# Patient Record
Sex: Male | Born: 1970
Health system: Southern US, Community
[De-identification: ages and names within clinical notes are randomized; demographics above are authoritative.]

## PROBLEM LIST (undated history)

## (undated) DIAGNOSIS — T753XXA Motion sickness, initial encounter: Secondary | ICD-10-CM

## (undated) DIAGNOSIS — N200 Calculus of kidney: Secondary | ICD-10-CM

## (undated) DIAGNOSIS — Z87442 Personal history of urinary calculi: Secondary | ICD-10-CM

## (undated) DIAGNOSIS — K429 Umbilical hernia without obstruction or gangrene: Principal | ICD-10-CM

## (undated) DIAGNOSIS — E119 Type 2 diabetes mellitus without complications: Secondary | ICD-10-CM

## (undated) DIAGNOSIS — Z973 Presence of spectacles and contact lenses: Secondary | ICD-10-CM

## (undated) DIAGNOSIS — B019 Varicella without complication: Secondary | ICD-10-CM

## (undated) HISTORY — PX: ANKLE SURGERY: SHX546

## (undated) HISTORY — PX: OTHER SURGICAL HISTORY: SHX169

## (undated) HISTORY — PX: WISDOM TOOTH EXTRACTION: SHX21

## (undated) HISTORY — DX: Umbilical hernia without obstruction or gangrene: K42.9

## (undated) HISTORY — PX: SHOULDER SURGERY: SHX246

## (undated) HISTORY — DX: Varicella without complication: B01.9

## (undated) HISTORY — DX: Calculus of kidney: N20.0

---

## 1998-10-26 ENCOUNTER — Emergency Department (HOSPITAL_COMMUNITY): Admission: EM | Admit: 1998-10-26 | Discharge: 1998-10-26 | Payer: Self-pay | Admitting: Emergency Medicine

## 1998-10-26 ENCOUNTER — Encounter: Payer: Self-pay | Admitting: Emergency Medicine

## 2002-03-19 ENCOUNTER — Encounter: Payer: Self-pay | Admitting: Occupational Medicine

## 2002-03-19 ENCOUNTER — Encounter: Admission: RE | Admit: 2002-03-19 | Discharge: 2002-03-19 | Payer: Self-pay | Admitting: Occupational Medicine

## 2005-12-21 ENCOUNTER — Emergency Department (HOSPITAL_COMMUNITY): Admission: EM | Admit: 2005-12-21 | Discharge: 2005-12-21 | Payer: Self-pay | Admitting: Family Medicine

## 2010-06-26 ENCOUNTER — Emergency Department: Payer: Self-pay | Admitting: Unknown Physician Specialty

## 2011-09-27 ENCOUNTER — Ambulatory Visit
Admission: RE | Admit: 2011-09-27 | Discharge: 2011-09-27 | Disposition: A | Discharge status: Home / Self Care | Payer: BC Managed Care – HMO | Source: Ambulatory Visit | Attending: Family Medicine | Admitting: Family Medicine

## 2011-09-27 ENCOUNTER — Other Ambulatory Visit: Payer: Self-pay | Admitting: Family Medicine

## 2011-09-27 DIAGNOSIS — R109 Unspecified abdominal pain: Secondary | ICD-10-CM

## 2011-09-27 MED ORDER — IOHEXOL 300 MG/ML  SOLN
125.0000 mL | Freq: Once | INTRAMUSCULAR | Status: AC | PRN
  Administered 2011-09-27: 125 mL via INTRAVENOUS

## 2011-10-12 ENCOUNTER — Encounter (INDEPENDENT_AMBULATORY_CARE_PROVIDER_SITE_OTHER): Payer: Self-pay | Admitting: General Surgery

## 2011-10-17 ENCOUNTER — Ambulatory Visit (INDEPENDENT_AMBULATORY_CARE_PROVIDER_SITE_OTHER): Payer: BC Managed Care – HMO | Admitting: Surgery

## 2011-10-17 ENCOUNTER — Encounter (INDEPENDENT_AMBULATORY_CARE_PROVIDER_SITE_OTHER): Payer: Self-pay | Admitting: Surgery

## 2011-10-17 VITALS — BP 137/94 | HR 91 | Temp 97.8°F | Ht 68.0 in | Wt 214.8 lb

## 2011-10-17 DIAGNOSIS — K429 Umbilical hernia without obstruction or gangrene: Secondary | ICD-10-CM | POA: Insufficient documentation

## 2011-10-17 HISTORY — DX: Umbilical hernia without obstruction or gangrene: K42.9

## 2011-10-17 NOTE — Patient Instructions (Signed)
If the small hernia in your belly-button starts causing pain or if it seems to be getting bigger, come back to see [place] and we will discuss surgery to repair it.

## 2011-10-17 NOTE — Progress Notes (Signed)
NAME: Andrew ZOLL JR. DOB: 1971/02/19 MRN: [id number]                                                                                      DATE: 10/17/2011  PCP: Provider Not In System Referring Provider: Brain Hilts A. Elmahdy  IMPRESSION:  Small umbilical hernia, asymptomatic  PLAN:   Does not need repair at this time. Discussed with him that if it enlarges or becomes symptomatic we can change plans and repair it                 CC:  Chief Complaint  Patient presents with  . Pre-op Exam    eval umb hernia    HPI:  Andrew Holloway. is a 41 y.o.  male who presents for evaluation of Umbilical hernia. It has been present since the 90's and never changed nor caused any problems. He was seen in an urgent care for some RLQ pain, and a CT confirmed a small UH containing fat. Surgical opinion was requested. There are no current symptoms and when he was having the RLQ pain the UH did not hurt  PMH:  has a past medical history of Umbilical hernia (10/17/2011).  PSH:   has past surgical history that includes Shoulder surgery and Ankle surgery.  ALLERGIES:  No Known Allergies  MEDICATIONS: No current outpatient prescriptions on file.  ROS: Negative  EXAM:   GENERAL:  The patient is alert, oriented, and generally healthy-appearing, NAD. Mood and affect are normal.  HEENT:  The head is normocephalic, the eyes nonicteric, the pupils were round regular and equal. EOMs are normal. Pharynx normal. Dentition good.  NECK:  The neck is supple and there are no masses or thyromegaly.  LUNGS:  Normal respirations and clear to auscultation.  HEART:  Regular rhythm, with no murmurs rubs or gallops. Pulses are intact carotid dorsalis pedis and posterior tibial. No significant varicosities are noted.  ABDOMEN:  Soft, flat, and nontender. No masses or organomegaly is noted. Small UH presenting just the  Right side of the Umbilcal area is noted. Bowel sounds are normal.The hernia reduces a bit,  is non tender and feels like pre-peritoneal fat.    EXTREMITIES:  Good range of motion, no edema.   DATA REVIEWED:  Notes from the referring physician    Currie Paris 10/17/2011  CC:Dr [facility]

## 2012-08-02 ENCOUNTER — Telehealth: Payer: Self-pay

## 2012-08-02 NOTE — Telephone Encounter (Signed)
error 

## 2012-08-07 HISTORY — PX: HERNIA REPAIR: SHX51

## 2013-02-12 ENCOUNTER — Ambulatory Visit (INDEPENDENT_AMBULATORY_CARE_PROVIDER_SITE_OTHER): Payer: Self-pay | Admitting: Internal Medicine

## 2013-02-12 ENCOUNTER — Encounter: Payer: Self-pay | Admitting: Internal Medicine

## 2013-02-12 VITALS — BP 118/80 | HR 74 | Temp 98.4°F | Resp 14 | Ht 68.0 in | Wt 214.5 lb

## 2013-02-12 DIAGNOSIS — K429 Umbilical hernia without obstruction or gangrene: Secondary | ICD-10-CM

## 2013-02-12 DIAGNOSIS — Z0000 Encounter for general adult medical examination without abnormal findings: Secondary | ICD-10-CM

## 2013-02-12 DIAGNOSIS — E669 Obesity, unspecified: Secondary | ICD-10-CM

## 2013-02-12 DIAGNOSIS — L989 Disorder of the skin and subcutaneous tissue, unspecified: Secondary | ICD-10-CM

## 2013-02-12 NOTE — Progress Notes (Signed)
Patient ID: Andrew Schwartz., male   DOB: 17-Mar-1971, 42 y.o.   MRN: [id number]  Patient Active Problem List   Diagnosis Date Noted  . Obesity, unspecified 02/13/2013  . Skin lesion of scalp 02/13/2013  . Umbilical hernia 10/17/2011    Subjective:  CC:   Chief Complaint  Patient presents with  . Establish Care    HPI:   Andrew Abdon. is a 42 y.o. male who presents as a new patient to establish primary care with the chief complaint of New patient, referred by wife Andrew Holloway, my patient and son Andrew Holloway  Cc:  unbilical hernia, Now becoming painful . Preset for 15 yrs now more prominent with weight gain of  12 to 20 lbs.    Weighed 11 3 lbs in high school,  119 when married. 24 yrs ago   2) overweight: Has lost 10 lb s following the low carb  Diet I gave his wife.  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  Played sports in high school  Had to give up  competitive softball 10 years ago  When he took current job     Past Medical History  Diagnosis Date  . Umbilical hernia 10/17/2011  . Kidney stones   . Chicken pox     Past Surgical History  Procedure Laterality Date  . Shoulder surgery      rotator cuff  . Ankle surgery      x2    Family History  Problem Relation Age of Onset  . Heart disease Paternal Grandfather   . Heart disease Maternal Grandfather   . Diabetes Maternal Grandmother   . Hypertension Mother   . Hyperlipidemia Father     History   Social History  . Marital Status: Married    Spouse Name: N/A    Number of Children: N/A  . Years of Education: N/A   Occupational History  . Not on file.   Social History Main Topics  . Smoking status: Never Smoker   . Smokeless tobacco: Never Used  . Alcohol Use: Yes     Comment: 2x month  . Drug Use: No  .  Sexually Active: Yes   Other Topics Concern  . Not on file   Social History Narrative  . No narrative on file    No Known Allergies    Review of Systems:   The remainder of the review of systems was negative except those addressed in the HPI.       Objective:  BP 118/80  Pulse 74  Temp(Src) 98.4 F (36.9 C) (Oral)  Resp 14  Ht 5\' 8"  (1.727 m)  Wt 214 lb 8 oz (97.297 kg)  BMI 32.62 kg/m2  SpO2 96%  General appearance: alert, cooperative and appears stated age Ears: normal TM's and external ear canals both ears Throat: lips, mucosa, and tongue normal; teeth and gums normal Neck: no adenopathy, no carotid bruit, supple, symmetrical, trachea midline and thyroid not enlarged, symmetric, no tenderness/mass/nodules Back: symmetric, no curvature. ROM normal. No CVA tenderness. Lungs: clear to auscultation bilaterally Heart: regular rate and rhythm, S1, S2 normal, no murmur, click, rub or gallop Abdomen: soft, non-tender; bowel sounds normal; no masses,  no organomegaly Pulses: 2+ and symmetric Skin: Skin color, texture, turgor normal. Dime  sized red lesion with raised border behind right ear  Lymph nodes: Cervical, supraclavicular, and axillary nodes normal.  Assessment and Plan:  Obesity, unspecified I have addressed  BMI and recommended a low glycemic index diet utilizing smaller more frequent meals to increase metabolism.  I have also recommended that patient start exercising with a goal of 30 minutes of aerobic exercise a minimum of 5 days per week. Screening for lipid disorders, thyroid and diabetes to be done today.    Umbilical hernia Referral to [facility] for management of hernia.   Skin lesion of scalp He has an annular lesion with raised borders behind his right ear. Referral to Dr Adolphus Birchwood. For evaluation    Updated Medication List No outpatient encounter prescriptions on file as of 02/12/2013.   No facility-administered encounter medications on  file as of 02/12/2013.     Orders Placed This Encounter  Procedures  . Ambulatory referral to General Surgery  . Ambulatory referral to Dermatology  . EKG 12-Lead    No Follow-up on file.

## 2013-02-12 NOTE — Patient Instructions (Addendum)
Referral  To Dr Adolphus Birchwood for evaluation of what may be a basal cell CA behind your right ear     Referral to Dr Floyce Stakes Surgica;l  Fasting labs ASAP at wife's office (use letter)

## 2013-02-13 ENCOUNTER — Encounter: Payer: Self-pay | Admitting: Internal Medicine

## 2013-02-13 DIAGNOSIS — L989 Disorder of the skin and subcutaneous tissue, unspecified: Secondary | ICD-10-CM | POA: Insufficient documentation

## 2013-02-13 DIAGNOSIS — E669 Obesity, unspecified: Secondary | ICD-10-CM | POA: Insufficient documentation

## 2013-02-13 NOTE — Assessment & Plan Note (Signed)
He has an annular lesion with raised borders behind his right ear. Referral to Dr Adolphus Birchwood. For evaluation

## 2013-02-13 NOTE — Assessment & Plan Note (Signed)
Referral to [facility] for management of hernia.

## 2013-02-13 NOTE — Assessment & Plan Note (Signed)
I have addressed  BMI and recommended a low glycemic index diet utilizing smaller more frequent meals to increase metabolism.  I have also recommended that patient start exercising with a goal of 30 minutes of aerobic exercise a minimum of 5 days per week. Screening for lipid disorders, thyroid and diabetes to be done today.   

## 2013-02-18 ENCOUNTER — Telehealth: Payer: Self-pay | Admitting: Internal Medicine

## 2013-02-18 NOTE — Telephone Encounter (Signed)
Patient notified and order faxed as requested. 

## 2013-02-18 NOTE — Telephone Encounter (Signed)
Left message to call office

## 2013-02-18 NOTE — Telephone Encounter (Signed)
Outside labs reviewed .  Cholesterol and thyroid were fine. No signs of diabetes.  But your liver enzyme areelevated for unclear reasons.   I recommend that you return  for additional blood tests to rule out various infections and autoimmune disorders that can elevated liver enzymes (hepatitis, hemochromatosis, etc) and have an ultrasound of the liver to examine its size and general appearance. I will send letter to Christy's office [facility]) for additional blood tests.  does not need to fast

## 2013-02-27 ENCOUNTER — Encounter: Payer: Self-pay | Admitting: Internal Medicine

## 2013-03-12 ENCOUNTER — Encounter: Payer: Self-pay | Admitting: General Surgery

## 2013-03-12 ENCOUNTER — Ambulatory Visit: Payer: Self-pay | Admitting: General Surgery

## 2013-03-12 ENCOUNTER — Ambulatory Visit (INDEPENDENT_AMBULATORY_CARE_PROVIDER_SITE_OTHER): Payer: PRIVATE HEALTH INSURANCE | Admitting: General Surgery

## 2013-03-12 VITALS — BP 124/88 | HR 74 | Resp 14 | Ht 68.0 in | Wt 218.0 lb

## 2013-03-12 DIAGNOSIS — K429 Umbilical hernia without obstruction or gangrene: Secondary | ICD-10-CM

## 2013-03-12 NOTE — Progress Notes (Signed)
Patient ID: Andrew Holloway., male   DOB: 06/27/1971, 42 y.o.   MRN: [id number]  Chief Complaint  Patient presents with  . Other    Evaluation of umbilical hernia    HPI [name]. is a 42 y.o. male who presents for an evaluation of an umbilical hernia. The patient states he has had this hernia for many years but has started to bother him within the last year. He states he has occasional abdominal pain. No other problems at this time.    HPI  Past Medical History  Diagnosis Date  . Umbilical hernia 10/17/2011  . Chicken pox   . Kidney stones     Past Surgical History  Procedure Laterality Date  . Shoulder surgery      rotator cuff  . Ankle surgery      x2    Family History  Problem Relation Age of Onset  . Heart disease Paternal Grandfather   . Heart disease Maternal Grandfather   . Diabetes Maternal Grandmother   . Hypertension Mother   . Hyperlipidemia Father     Social History History  Substance Use Topics  . Smoking status: Never Smoker   . Smokeless tobacco: Never Used  . Alcohol Use: Yes     Comment: 2x month    No Known Allergies  No current outpatient prescriptions on file.   No current facility-administered medications for this visit.    Review of Systems Review of Systems  Constitutional: Negative.   Respiratory: Negative.   Cardiovascular: Negative.   Gastrointestinal: Positive for abdominal pain.    Blood pressure 124/88, pulse 74, resp. rate 14, height 5\' 8"  (1.727 m), weight 218 lb (98.884 kg).  Physical Exam Physical Exam  Constitutional: He is oriented to person, place, and time. He appears well-developed and well-nourished.  Neck: No thyromegaly present.  Cardiovascular: Normal rate, regular rhythm and normal heart sounds.   No murmur heard. Pulmonary/Chest: Effort normal and breath sounds normal.  Abdominal: Soft. Normal appearance and bowel sounds are normal. A hernia is present.  Nondeductible umbilical hernia. 2 cm  defect.   Lymphadenopathy:    He has no cervical adenopathy.  Neurological: He is alert and oriented to person, place, and time.    Data Reviewed None   Assessment    Symptomatic umbilical hernia    Plan    Indications for surgical repair were reviewed. The possibility of prosthetic mesh for reinforcement was discussed.    Patient's surgery has been scheduled for 03-28-13 at [facility].    Earline Mayotte 03/14/2013, 6:50 PM

## 2013-03-12 NOTE — Patient Instructions (Addendum)
Patient to be scheduled for hernia repair. Patient advised of lifting, pushing, and pulling restraints after repair.   Hernia, Surgical Repair A hernia occurs when an internal organ pushes out through a weak spot in the belly (abdominal) wall muscles. Hernias commonly occur in the groin and around the navel. Hernias often can be pushed back into place (reduced). Most hernias tend to get worse over time. Problems occur when abdominal contents get stuck in the opening (incarcerated hernia). The blood supply gets cut off (strangulated hernia). This is an emergency and needs surgery. Otherwise, hernia repair can be an elective procedure. This means you can schedule this at your convenience when an emergency is not present. Because complications can occur, if you decide to repair the hernia, it is best to do it soon. When it becomes an emergency procedure, there is increased risk of complications after surgery. CAUSES   Heavy lifting.  Obesity.  Prolonged coughing.  Straining to move your bowels.  Hernias can also occur through a cut (incision) by a surgeonafter an abdominal operation. HOME CARE INSTRUCTIONS Before the repair:  Bed rest is not required. You may continue your normal activities, but avoid heavy lifting (more than 10 pounds) or straining. Cough gently. If you are a smoker, it is best to stop. Even the best hernia repair can break down with the continual strain of coughing.  Do not wear anything tight over your hernia. Do not try to keep it in with an outside bandage or truss. These can damage abdominal contents if they are trapped in the hernia sac.  Eat a normal diet. Avoid constipation. Straining over long periods of time to have a bowel movement will increase hernia size. It also can breakdown repairs. If you cannot do this with diet alone, laxatives or stool softeners may be used. PRIOR TO SURGERY, SEEK IMMEDIATE MEDICAL CARE IF: You have problems (symptoms) of a trapped  (incarcerated) hernia. Symptoms include:  An oral temperature above 102 F (38.9 C) develops, or as your caregiver suggests.  Increasing abdominal pain.  Feeling sick to your stomach(nausea) and vomiting.  You stop passing gas or stool.  The hernia is stuck outside the abdomen, looks discolored, feels hard, or is tender.  You have any changes in your bowel habits or in the hernia that is unusual for you. LET YOUR CAREGIVERS KNOW ABOUT THE FOLLOWING:  Allergies.  Medications taken including herbs, eye drops, over the counter medications, and creams.  Use of steroids (by mouth or creams).  Family or personal history of problems with anesthetics or Novocaine.  Possibility of pregnancy, if this applies.  Personal history of blood clots (thrombophlebitis).  Family or personal history of bleeding or blood problems.  Previous surgery.  Other health problems. BEFORE THE PROCEDURE You should be present 1 hour prior to your procedure, or as directed by your caregiver.  AFTER THE PROCEDURE After surgery, you will be taken to the recovery area. A nurse will watch and check your progress there. Once you are awake, stable, and taking fluids well, you will be allowed to go home as long as there are no problems. Once home, an ice pack (wrapped in a light towel) applied to your operative site may help with discomfort. It may also keep the swelling down. Do not lift anything heavier than 10 pounds (4.55 kilograms). Take showers not baths. Do not drive while taking narcotics. Follow instructions as suggested by your caregiver.  SEEK IMMEDIATE MEDICAL CARE IF: After surgery:  There is  redness, swelling, or increasing pain in the wound.  There is pus coming from the wound.  There is drainage from a wound lasting longer than 1 day.  An unexplained oral temperature above 102 F (38.9 C) develops.  You notice a foul smell coming from the wound or dressing.  There is a breaking open of a  wound (edged not staying together) after the sutures have been removed.  You notice increasing pain in the shoulders (shoulder strap areas).  You develop dizzy episodes or fainting while standing.  You develop persistent nausea or vomiting.  You develop a rash.  You have difficulty breathing.  You develop any reaction or side effects to medications given. MAKE SURE YOU:   Understand these instructions.  Will watch your condition.  Will get help right away if you are not doing well or get worse. Document Released: 01/17/2001 Document Revised: 10/16/2011 Document Reviewed: 12/10/2007 [facility] Patient Information 2014 [place], [place].  Patient's surgery has been scheduled for 03-28-13 at [facility].

## 2013-03-14 ENCOUNTER — Encounter: Payer: Self-pay | Admitting: General Surgery

## 2013-03-14 ENCOUNTER — Other Ambulatory Visit: Payer: Self-pay | Admitting: General Surgery

## 2013-03-14 DIAGNOSIS — K429 Umbilical hernia without obstruction or gangrene: Secondary | ICD-10-CM

## 2013-03-28 ENCOUNTER — Ambulatory Visit: Payer: Self-pay | Admitting: General Surgery

## 2013-03-28 DIAGNOSIS — K429 Umbilical hernia without obstruction or gangrene: Secondary | ICD-10-CM

## 2013-04-02 ENCOUNTER — Telehealth: Payer: Self-pay | Admitting: *Deleted

## 2013-04-02 NOTE — Telephone Encounter (Signed)
Phone call from pt (talked to [place]) states he has a rash/bumps with itching from chest to lower abdomen that started on Saturday. Umbilical hernia surgery was Friday.  He is using oral benadryl.  He will try hydrocortisone cream and benadryl spray and will call tomorrow with progress report.

## 2013-04-03 ENCOUNTER — Telehealth: Payer: Self-pay | Admitting: *Deleted

## 2013-04-03 NOTE — Telephone Encounter (Signed)
Phone call from patient.  States rash remains but he is not taking benadryl tablet.  Advised to continue to take benadryl until rash is gone and informed that they make a benadryl spray topically as well. He feels like it can tolerate it now but if it worsens he will call back tomorrow if worsens.

## 2013-04-04 ENCOUNTER — Encounter: Payer: Self-pay | Admitting: General Surgery

## 2013-04-09 ENCOUNTER — Encounter: Payer: Self-pay | Admitting: General Surgery

## 2013-04-09 ENCOUNTER — Ambulatory Visit (INDEPENDENT_AMBULATORY_CARE_PROVIDER_SITE_OTHER): Payer: PRIVATE HEALTH INSURANCE | Admitting: General Surgery

## 2013-04-09 VITALS — BP 120/80 | HR 80 | Resp 12 | Ht 68.0 in | Wt 219.0 lb

## 2013-04-09 DIAGNOSIS — K429 Umbilical hernia without obstruction or gangrene: Secondary | ICD-10-CM

## 2013-04-09 NOTE — Patient Instructions (Addendum)
Proper lifting techniques reviewed. Patient to return to work on 04/14/13.

## 2013-04-09 NOTE — Progress Notes (Signed)
Patient ID: Andrew Schwartz., male   DOB: 1970/08/21, 42 y.o.   MRN: [id number]  Chief Complaint  Patient presents with  . Routine Post Op    umbilical hernia repair     HPI Andrew Holloway. is a 42 y.o. male here today for his post op umbilical hernia repair done on 03/28/13.Patient states he has had an rash since surgery. He had been required to make use of a chlorhexidine prepped wiped prior to the procedure. He did receive Kefzol intravenously as well. HPI  Past Medical History  Diagnosis Date  . Umbilical hernia 10/17/2011  . Chicken pox   . Kidney stones     Past Surgical History  Procedure Laterality Date  . Shoulder surgery      rotator cuff  . Ankle surgery      x2  . Hernia repair  2014    umbilical hernia    Family History  Problem Relation Age of Onset  . Heart disease Paternal Grandfather   . Heart disease Maternal Grandfather   . Diabetes Maternal Grandmother   . Hypertension Mother   . Hyperlipidemia Father     Social History History  Substance Use Topics  . Smoking status: Never Smoker   . Smokeless tobacco: Never Used  . Alcohol Use: Yes     Comment: 2x month    No Known Allergies  No current outpatient prescriptions on file.   No current facility-administered medications for this visit.    Review of Systems Review of Systems  Constitutional: Negative.   Respiratory: Negative.   Cardiovascular: Negative.     Blood pressure 120/80, pulse 80, resp. rate 12, height 5\' 8"  (1.727 m), weight 219 lb (99.338 kg).  Physical Exam Physical Exam  Constitutional: He is oriented to person, place, and time. He appears well-developed and well-nourished.  Abdominal:  Umbilical hernia repair healing well.  Neurological: He is alert and oriented to person, place, and time.  Skin: Skin is warm and dry.    Data Reviewed None.  Assessment    Doing well status post primary repair of a small umbilical hernia.    Plan    Good judgment with  strenuous activity was encouraged. Proper lifting technique was reviewed. He'll be maintained on a 20 pound weight restriction for the next 2 weeks.       Earline Mayotte 04/11/2013, 5:36 PM

## 2013-04-11 ENCOUNTER — Encounter: Payer: Self-pay | Admitting: General Surgery

## 2013-06-12 ENCOUNTER — Other Ambulatory Visit: Payer: Self-pay

## 2014-03-20 ENCOUNTER — Ambulatory Visit: Payer: Self-pay | Admitting: Unknown Physician Specialty

## 2014-11-04 ENCOUNTER — Emergency Department
Admit: 2014-11-04 | Disposition: A | Discharge status: Home / Self Care | Payer: Self-pay | Admitting: Emergency Medicine

## 2014-11-04 ENCOUNTER — Telehealth: Payer: Self-pay | Admitting: Internal Medicine

## 2014-11-04 LAB — COMPREHENSIVE METABOLIC PANEL
ALBUMIN: 4.5 g/dL
ALT: 35 U/L
Alkaline Phosphatase: 62 U/L
Anion Gap: 8 (ref 7–16)
BUN: 12 mg/dL
Bilirubin,Total: 2 mg/dL — ABNORMAL HIGH
Calcium, Total: 9.5 mg/dL
Chloride: 101 mmol/L
Co2: 30 mmol/L
Creatinine: 1.09 mg/dL
EGFR (African American): >60
EGFR (Non-African Amer.): >60
Glucose: 132 mg/dL — ABNORMAL HIGH
POTASSIUM: 3.9 mmol/L
SGOT(AST): 22 U/L
SODIUM: 139 mmol/L
TOTAL PROTEIN: 8.5 g/dL — AB

## 2014-11-04 LAB — URINALYSIS, COMPLETE
Bacteria: NONE SEEN
Bilirubin,UR: NEGATIVE
GLUCOSE, UR: NEGATIVE mg/dL (ref 0–75)
Ketone: NEGATIVE
LEUKOCYTE ESTERASE: NEGATIVE
NITRITE: NEGATIVE
Ph: 5 (ref 4.5–8.0)
Protein: 30
SPECIFIC GRAVITY: 1.029 (ref 1.003–1.030)
Squamous Epithelial: NONE SEEN

## 2014-11-04 LAB — CBC
HCT: 50.7 % (ref 40.0–52.0)
HGB: 17 g/dL (ref 13.0–18.0)
MCH: 30.5 pg (ref 26.0–34.0)
MCHC: 33.5 g/dL (ref 32.0–36.0)
MCV: 91 fL (ref 80–100)
Platelet: 290 10*3/uL (ref 150–440)
RBC: 5.58 10*6/uL (ref 4.40–5.90)
RDW: 13.5 % (ref 11.5–14.5)
WBC: 14.4 10*3/uL — ABNORMAL HIGH (ref 3.8–10.6)

## 2014-11-04 LAB — LIPASE, BLOOD: LIPASE: 27 U/L

## 2014-11-04 NOTE — Telephone Encounter (Signed)
Patient advised by triage to go to ER patient confirmed going to ER. FYI

## 2014-11-04 NOTE — Telephone Encounter (Signed)
Patient Name: Rosaura CarpenterWILLIAM Scardino DOB: 04/14/1971 Initial Comment Caller states he is having lower abdominal pain on left side. Nurse Assessment Nurse: Charna Elizabethrumbull, RN, Cathy Date/Time (Eastern Time): 11/04/2014 9:57:11 AM Confirm and document reason for call. If symptomatic, describe symptoms. ---Caller states he developed left, lower abdominal pain 2 days ago. No vomiting or diarrhea. He states he had fever last night that has now gone away (100.6 temporal last night). No injury in the past 3 days. Has the patient traveled out of the country within the last 30 days? ---No Does the patient require triage? ---Yes Related visit to physician within the last 2 weeks? ---No Does the PT have any chronic conditions? (i.e. diabetes, asthma, etc.) ---No Guidelines Guideline Title Affirmed Question Affirmed Notes Abdominal Pain - Male [1] MODERATE pain (e.g., interferes with normal activities) AND [2] pain comes and goes (cramps) AND [3] present > 24 hours (Exception: pain with Vomiting or Diarrhea - see that Guideline) Hernia Hernia is painful or tender to touch Final Disposition User Go to ED Now Trumbull, RN, [employer] LaboratoriesCathy Comments Caller shared that he has had an umbilical hernia for the past 1.5 Years. He plans to go [facility].

## 2014-11-13 ENCOUNTER — Ambulatory Visit: Payer: Self-pay | Admitting: Nurse Practitioner

## 2014-11-27 NOTE — Op Note (Signed)
PATIENT NAME:  Andrew Holloway, Andrew Holloway MR#:  [id number] DATE OF BIRTH:  [birth year]  DATE OF PROCEDURE:  03/28/2013  PREOPERATIVE DIAGNOSIS: Symptomatic umbilical hernia with incarcerated preperitoneal fat.   POSTOPERATIVE DIAGNOSIS: Symptomatic umbilical hernia with incarcerated preperitoneal fat.     OPERATIVE PROCEDURE: Repair of umbilical hernia.   SURGEON:  Earline MayotteJeffrey W. Haru Shaff, MD   ANESTHESIA: General endotracheal, Marcaine 0.5% plain, 30 mL local infiltration.   ESTIMATED BLOOD LOSS:  Minimal.   CLINICAL NOTE: This 44 year old male has developed an enlarging umbilical hernia that has become increasingly symptomatic, with local discomfort. The preperitoneal fat was nonreducible. He was felt to be a candidate for excision.   OPERATIVE NOTE: With the patient under adequate general endotracheal anesthesia, the abdomen was prepped with ChloraPrep and draped. Marcaine was infiltrated for postoperative analgesia. An infraumbilical incision was made and carried down through the skin and subcutaneous tissue, with hemostasis achieved by electrocautery. The umbilical skin was elevated from the underlying hernia sac. The sac was excised and discarded. The adipose tissue was returned to the abdominal cavity. The defect was only about 1.3 cm in diameter. Primary repair was undertaken. Interrupted 0 Surgilon sutures were utilized. The umbilical skin was tacked to the fascia with 3-0 Vicryl. Then 30 mg of Toradol was infiltrated in the adjacent tissues for postoperative analgesia. The adipose layer was approximated with a running 3-0 Vicryl. The skin was closed with a running 4-0 Vicryl subcuticular suture. Benzoin, Steri-Strips, Telfa and Tegaderm dressing were then applied.   The patient tolerated the procedure well, and was taken to the recovery room in stable condition.      ____________________________ Earline MayotteJeffrey W. Lennin Osmond, MD jwb:mr D: 03/28/2013 19:37:20 ET T: 03/28/2013 21:28:38  ET JOB#: [id number]  cc: Earline MayotteJeffrey W. Zakery Normington, MD, <Dictator> Tomi BambergerSusan Fuller, DNP, NP at [facility]   Lille Karim Brion AlimentW Malayjah Otoole MD ELECTRONICALLY SIGNED 03/31/2013 13:11

## 2015-03-09 ENCOUNTER — Ambulatory Visit (INDEPENDENT_AMBULATORY_CARE_PROVIDER_SITE_OTHER): Payer: Worker's Compensation | Admitting: Family Medicine

## 2015-03-09 VITALS — BP 122/86 | HR 90 | Temp 98.3°F | Resp 16 | Ht 67.5 in | Wt 231.4 lb

## 2015-03-09 DIAGNOSIS — K439 Ventral hernia without obstruction or gangrene: Secondary | ICD-10-CM | POA: Diagnosis not present

## 2015-03-09 DIAGNOSIS — S39011A Strain of muscle, fascia and tendon of abdomen, initial encounter: Secondary | ICD-10-CM | POA: Diagnosis not present

## 2015-03-09 NOTE — Progress Notes (Signed)
Subjective:  This chart was scribed for Trinna Post, MD by Broadus John, Medical Scribe. This patient was seen in Room 13 and the patient's care was started at 6:50 PM.   Patient ID: Andrew Schwartz., male    DOB: 1971/01/29, 44 y.o.   MRN: [id number]  HPI HPI Comments: Andrew Shearn. is a 44 y.o. male who presents to [facility] complaining of abdominal pain, injury at work, onset yesterday at 03/08/15.  Pt notes that he was taking some cylinders out and it swung out while he was holding in a rack with his left hand; therefore instead of going up, it went down and his upper abdomen muscles tensed up. Pt has a history of an umbilical hernia two years ago repaired, He notes that he has not experienced any difficulties with it. He indicates that the pain is more severe when in supine position and sitting up. He denies groin pain, nausea, vomiting, trouble in urination, or bowel changes.  Pt notes that he has a CT scan in April 2016 for diverticulitis, where they found a hernia on the left side,  Been not been experiencing pain in that area prior to his injury at work.  Pt denies history of stomach ulcers.   Pt works Engineer, production for [employer].   Patient Active Problem List   Diagnosis Date Noted  . Obesity, unspecified 02/13/2013  . Skin lesion of scalp 02/13/2013  . Umbilical hernia 10/17/2011   Past Medical History  Diagnosis Date  . Umbilical hernia 10/17/2011  . Chicken pox   . Kidney stones    Past Surgical History  Procedure Laterality Date  . Shoulder surgery      rotator cuff  . Ankle surgery      x2  . Hernia repair  2014    umbilical hernia  . Nasal spectrum    . Rotator cuff right    . Left and right ankle repair ligament     No Known Allergies Prior to Admission medications   Not on File   History   Social History  . Marital Status: Married    Spouse Name: N/A  . Number of Children: N/A  . Years of Education: N/A    Occupational History  . Not on file.   Social History Main Topics  . Smoking status: Never Smoker   . Smokeless tobacco: Never Used  . Alcohol Use: Yes     Comment: 2x month  . Drug Use: No  . Sexual Activity: Yes   Other Topics Concern  . Not on file   Social History Narrative     Review of Systems  Gastrointestinal: Positive for abdominal pain. Negative for nausea, vomiting, diarrhea and constipation.  Genitourinary: Negative for difficulty urinating and genital sores.       Objective:   Physical Exam  Constitutional: He is oriented to person, place, and time. He appears well-developed and well-nourished. No distress.  HENT:  Head: Normocephalic and atraumatic.  Eyes: EOM are normal. Pupils are equal, round, and reactive to light.  Neck: Neck supple.  Cardiovascular: Normal rate.   Pulmonary/Chest: Effort normal.  Abdominal: Bowel sounds are normal.  Tenderness along mid abdomen just above umbilicus just to the right, no palpable defects.   No palpable tenderness or hernia in the inguinal canal.     Musculoskeletal:  Pain with Valsalva as well as sitting up.   Neurological: He is alert and oriented to person,  place, and time. No cranial nerve deficit.  Skin: Skin is warm and dry.  Well healed scar below the umbilicus superior and inferior  Psychiatric: He has a normal mood and affect. His behavior is normal.  Nursing note and vitals reviewed.   Filed Vitals:   03/09/15 1821  BP: 122/86  Pulse: 90  Temp: 98.3 F (36.8 C)  TempSrc: Oral  Resp: 16  Height: 5' 7.5" (1.715 m)  Weight: 231 lb 6 oz (104.951 kg)  SpO2: 98%      Assessment & Plan:  Andrew Derasmo. is a 43 y.o. male Abdominal wall strain, initial encounter - Plan: Ambulatory referral to General Surgery  Hernia of abdominal wall - Plan: Ambulatory referral to General Surgery  No apparent defect or bulge of abd wall on exam, but palpable tenderness over midline abdomen near the prior  hernia repair site,  and reproduction of pain with sitting up or Valsalva.. Suspicious for recurrence of hernia, so will refer to surgeon for eval.   Lifting and straining precautions discussed, and restrictions provided until evaluated by surgeon.    No orders of the defined types were placed in this encounter.   Patient Instructions  I will refer you to the general surgeon, and requested your previous doctor. For now avoid any heavy lifting or straining. See precautions below, but if any increased swelling or acutely increased pain in this area, return to clinic or emergency room for evaluation.  Advil or Aleve over-the-counter if needed for discomfort.  Hernia A hernia occurs when an internal organ pushes out through a weak spot in the abdominal wall. Hernias most commonly occur in the groin and around the navel. Hernias often can be pushed back into place (reduced). Most hernias tend to get worse over time. Some abdominal hernias can get stuck in the opening (irreducible or incarcerated hernia) and cannot be reduced. An irreducible abdominal hernia which is tightly squeezed into the opening is at risk for impaired blood supply (strangulated hernia). A strangulated hernia is a medical emergency. Because of the risk for an irreducible or strangulated hernia, surgery may be recommended to repair a hernia. CAUSES   Heavy lifting.  Prolonged coughing.  Straining to have a bowel movement.  A cut (incision) made during an abdominal surgery. HOME CARE INSTRUCTIONS   Bed rest is not required. You may continue your normal activities.  Avoid lifting more than 10 pounds (4.5 kg) or straining.  Cough gently. If you are a smoker it is best to stop. Even the best hernia repair can break down with the continual strain of coughing. Even if you do not have your hernia repaired, a cough will continue to aggravate the problem.  Do not wear anything tight over your hernia. Do not try to keep it in with an  outside bandage or truss. These can damage abdominal contents if they are trapped within the hernia sac.  Eat a normal diet.  Avoid constipation. Straining over long periods of time will increase hernia size and encourage breakdown of repairs. If you cannot do this with diet alone, stool softeners may be used. SEEK IMMEDIATE MEDICAL CARE IF:   You have a fever.  You develop increasing abdominal pain.  You feel nauseous or vomit.  Your hernia is stuck outside the abdomen, looks discolored, feels hard, or is tender.  You have any changes in your bowel habits or in the hernia that are unusual for you.  You have increased pain or swelling around  the hernia.  You cannot push the hernia back in place by applying gentle pressure while lying down. MAKE SURE YOU:   Understand these instructions.  Will watch your condition.  Will get help right away if you are not doing well or get worse. Document Released: 07/24/2005 Document Revised: 10/16/2011 Document Reviewed: 03/12/2008 [facility] Patient Information 2015 [place], [place]. This information is not intended to replace advice given to you by your health care provider. Make sure you discuss any questions you have with your health care provider.     I personally performed the services described in this documentation, which was scribed in my presence. The recorded information has been reviewed and considered, and addended by me as needed.

## 2015-03-09 NOTE — Patient Instructions (Signed)
I will refer you to the general surgeon, and requested your previous doctor. For now avoid any heavy lifting or straining. See precautions below, but if any increased swelling or acutely increased pain in this area, return to clinic or emergency room for evaluation.  Advil or Aleve over-the-counter if needed for discomfort.  Hernia A hernia occurs when an internal organ pushes out through a weak spot in the abdominal wall. Hernias most commonly occur in the groin and around the navel. Hernias often can be pushed back into place (reduced). Most hernias tend to get worse over time. Some abdominal hernias can get stuck in the opening (irreducible or incarcerated hernia) and cannot be reduced. An irreducible abdominal hernia which is tightly squeezed into the opening is at risk for impaired blood supply (strangulated hernia). A strangulated hernia is a medical emergency. Because of the risk for an irreducible or strangulated hernia, surgery may be recommended to repair a hernia. CAUSES   Heavy lifting.  Prolonged coughing.  Straining to have a bowel movement.  A cut (incision) made during an abdominal surgery. HOME CARE INSTRUCTIONS   Bed rest is not required. You may continue your normal activities.  Avoid lifting more than 10 pounds (4.5 kg) or straining.  Cough gently. If you are a smoker it is best to stop. Even the best hernia repair can break down with the continual strain of coughing. Even if you do not have your hernia repaired, a cough will continue to aggravate the problem.  Do not wear anything tight over your hernia. Do not try to keep it in with an outside bandage or truss. These can damage abdominal contents if they are trapped within the hernia sac.  Eat a normal diet.  Avoid constipation. Straining over long periods of time will increase hernia size and encourage breakdown of repairs. If you cannot do this with diet alone, stool softeners may be used. SEEK IMMEDIATE MEDICAL  CARE IF:   You have a fever.  You develop increasing abdominal pain.  You feel nauseous or vomit.  Your hernia is stuck outside the abdomen, looks discolored, feels hard, or is tender.  You have any changes in your bowel habits or in the hernia that are unusual for you.  You have increased pain or swelling around the hernia.  You cannot push the hernia back in place by applying gentle pressure while lying down. MAKE SURE YOU:   Understand these instructions.  Will watch your condition.  Will get help right away if you are not doing well or get worse. Document Released: 07/24/2005 Document Revised: 10/16/2011 Document Reviewed: 03/12/2008 [facility] Patient Information 2015 [place], [place]. This information is not intended to replace advice given to you by your health care provider. Make sure you discuss any questions you have with your health care provider.

## 2015-03-10 ENCOUNTER — Ambulatory Visit: Payer: Self-pay | Admitting: Family Medicine

## 2015-03-16 ENCOUNTER — Ambulatory Visit (INDEPENDENT_AMBULATORY_CARE_PROVIDER_SITE_OTHER): Payer: Worker's Compensation | Admitting: General Surgery

## 2015-03-16 ENCOUNTER — Encounter: Payer: Self-pay | Admitting: General Surgery

## 2015-03-16 VITALS — BP 130/70 | HR 76 | Resp 12 | Ht 67.5 in | Wt 227.0 lb

## 2015-03-16 DIAGNOSIS — K439 Ventral hernia without obstruction or gangrene: Secondary | ICD-10-CM

## 2015-03-16 NOTE — Progress Notes (Signed)
Patient ID: Andrew Holloway., male   DOB: [birth year], 44 y.o.   MRN: [id number]  Chief Complaint  Patient presents with  . Hernia    HPI [name]. is a 44 y.o. male here today for a evaluation of a possible umbilical hernia. Patient was seen in the urgent care on 03/09/15. 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 He has noticed a bulge when standing, and appreciate some discomfort when bending.  States his bowels are regular and daily.  HPI  Past Medical History  Diagnosis Date  . Umbilical hernia 10/17/2011  . Chicken pox   . Kidney stones     Past Surgical History  Procedure Laterality Date  . Shoulder surgery      rotator cuff  . Ankle surgery      x2  . Hernia repair  2014    umbilical hernia  . Nasal spectrum    . Rotator cuff right    . Left and right ankle repair ligament      Family History  Problem Relation Age of Onset  . Heart disease Paternal Grandfather   . Heart disease Maternal Grandfather   . Diabetes Maternal Grandmother   . Hypertension Mother   . Hyperlipidemia Father     Social History Social History  Substance Use Topics  . Smoking status: Never Smoker   . Smokeless tobacco: Never Used  . Alcohol Use: Yes     Comment: 2x month    No Known Allergies  No current outpatient prescriptions on file.   No current facility-administered medications for this visit.    Review of Systems Review of Systems  Constitutional: Negative.   Respiratory: Negative.   Cardiovascular: Negative.   Gastrointestinal: Negative for diarrhea and constipation.    Blood pressure 130/70, pulse 76, resp. rate 12, height 5' 7.5" (1.715 m), weight 227 lb (102.967 kg).  Physical Exam Physical Exam  Constitutional: He is oriented to person, place, and time. He appears well-developed and well-nourished.  HENT:   Mouth/Throat: Oropharynx is clear and moist.  Eyes: Conjunctivae are normal. No scleral icterus.  Neck: Neck supple.  Cardiovascular: Normal rate, regular rhythm and normal heart sounds.   Pulmonary/Chest: Effort normal and breath sounds normal.  Abdominal: Soft. Normal appearance. There is tenderness. A hernia is present.    Midline defect 3 cm above umbilicus   Lymphadenopathy:    He has no cervical adenopathy.  Neurological: He is alert and oriented to person, place, and time.  Skin: Skin is warm and dry.  Psychiatric: His behavior is normal.    Data Reviewed 2014 operative note from primary repair of a less than 2 cm umbilical fascial defect.  Assessment    Epigastric hernia, symptomatic.    Plan    Indications for elective repair review. I do not think that mesh will be required.     Hernia precautions and incarceration were discussed with the patient. If they develop symptoms of an incarcerated hernia, they were encouraged to seek prompt medical attention.  I have recommended repair of the hernia using mesh on an outpatient basis in the near future. The risk of infection was reviewed.   PCP:  Macky Lower 03/17/2015, 11:43 AM

## 2015-03-16 NOTE — Patient Instructions (Signed)

## 2015-03-17 ENCOUNTER — Telehealth: Payer: Self-pay | Admitting: *Deleted

## 2015-03-17 DIAGNOSIS — K439 Ventral hernia without obstruction or gangrene: Secondary | ICD-10-CM | POA: Insufficient documentation

## 2015-03-17 NOTE — Telephone Encounter (Signed)
Patient is wanting a back to work note with the limitations that he needs. He saw Dr.Byrnett yesterday 03/16/15

## 2015-03-17 NOTE — H&P (Signed)
Patient ID: Andrew S Doering Jr., male   DOB: 06/05/1971, 44 y.o.   MRN: [id number]  Chief Complaint  Patient presents with  . Hernia    HPI Andrew S Luty Jr. is a 44 y.o. male here today for a evaluation of a possible umbilical hernia. Patient was seen in the urgent care on 03/09/15. 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 He has noticed a bulge when standing, and appreciate some discomfort when bending.  States his bowels are regular and daily.  HPI  Past Medical History  Diagnosis Date  . Umbilical hernia 10/17/2011  . Chicken pox   . Kidney stones     Past Surgical History  Procedure Laterality Date  . Shoulder surgery      rotator cuff  . Ankle surgery      x2  . Hernia repair  2014    umbilical hernia  . Nasal spectrum    . Rotator cuff right    . Left and right ankle repair ligament      Family History  Problem Relation Age of Onset  . Heart disease Paternal Grandfather   . Heart disease Maternal Grandfather   . Diabetes Maternal Grandmother   . Hypertension Mother   . Hyperlipidemia Father     Social History Social History  Substance Use Topics  . Smoking status: Never Smoker   . Smokeless tobacco: Never Used  . Alcohol Use: Yes     Comment: 2x month    No Known Allergies  No current outpatient prescriptions on file.   No current facility-administered medications for this visit.    Review of Systems Review of Systems  Constitutional: Negative.   Respiratory: Negative.   Cardiovascular: Negative.   Gastrointestinal: Negative for diarrhea and constipation.    Blood pressure 130/70, pulse 76, resp. rate 12, height 5' 7.5" (1.715 m), weight 227 lb (102.967 kg).  Physical Exam Physical Exam  Constitutional: He is oriented to person, place, and time. He appears well-developed and well-nourished.  HENT:   Mouth/Throat: Oropharynx is clear and moist.  Eyes: Conjunctivae are normal. No scleral icterus.  Neck: Neck supple.  Cardiovascular: Normal rate, regular rhythm and normal heart sounds.   Pulmonary/Chest: Effort normal and breath sounds normal.  Abdominal: Soft. Normal appearance. There is tenderness. A hernia is present.    Midline defect 3 cm above umbilicus   Lymphadenopathy:    He has no cervical adenopathy.  Neurological: He is alert and oriented to person, place, and time.  Skin: Skin is warm and dry.  Psychiatric: His behavior is normal.    Data Reviewed 2014 operative note from primary repair of a less than 2 cm umbilical fascial defect.  Assessment    Epigastric hernia, symptomatic.    Plan    Indications for elective repair review. I do not think that mesh will be required.     Hernia precautions and incarceration were discussed with the patient. If they develop symptoms of an incarcerated hernia, they were encouraged to seek prompt medical attention.  I have recommended repair of the hernia using mesh on an outpatient basis in the near future. The risk of infection was reviewed.   PCP:  Tullo, Teresa L  Alexica Schlossberg W 03/17/2015, 11:43 AM      medical attention.  I have recommended repair of the hernia using mesh on an outpatient basis in the near future. The risk of infection was reviewed.  PCP: Macky Lower  03/17/2015, 11:43 AM

## 2015-03-17 NOTE — Telephone Encounter (Signed)
Note done, pt aware faxed to [facility] [phone]

## 2015-03-23 ENCOUNTER — Encounter: Payer: Self-pay | Admitting: *Deleted

## 2015-03-23 ENCOUNTER — Inpatient Hospital Stay: Admission: RE | Admit: 2015-03-23 | Payer: Self-pay | Source: Ambulatory Visit

## 2015-03-23 DIAGNOSIS — Z833 Family history of diabetes mellitus: Secondary | ICD-10-CM | POA: Diagnosis not present

## 2015-03-23 DIAGNOSIS — Z9889 Other specified postprocedural states: Secondary | ICD-10-CM | POA: Diagnosis not present

## 2015-03-23 DIAGNOSIS — Z8489 Family history of other specified conditions: Secondary | ICD-10-CM | POA: Diagnosis not present

## 2015-03-23 DIAGNOSIS — K429 Umbilical hernia without obstruction or gangrene: Secondary | ICD-10-CM | POA: Diagnosis present

## 2015-03-23 DIAGNOSIS — K439 Ventral hernia without obstruction or gangrene: Secondary | ICD-10-CM | POA: Diagnosis present

## 2015-03-23 DIAGNOSIS — Z8249 Family history of ischemic heart disease and other diseases of the circulatory system: Secondary | ICD-10-CM | POA: Diagnosis not present

## 2015-03-23 NOTE — Patient Instructions (Signed)
  Your procedure is scheduled on: 03/30/15 Report to Day Surgery. [facility] To find out your arrival time please call [phone] between 1PM - 3PM on8/22/16  Remember: Instructions that are not followed completely may result in serious medical risk, up to and including death, or upon the discretion of your surgeon and anesthesiologist your surgery may need to be rescheduled.    X____ 1. Do not eat food or drink liquids after midnight. No gum chewing or hard candies.     __X__ 2. No Alcohol for 24 hours before or after surgery.   ____ 3. Bring all medications with you on the day of surgery if instructed.    __X__ 4. Notify your doctor if there is any change in your medical condition     (cold, fever, infections).     Do not wear jewelry, make-up, hairpins, clips or nail polish.  Do not wear lotions, powders, or perfumes. You may wear deodorant.  Do not shave 48 hours prior to surgery. Men may shave face and neck.  Do not bring valuables to the hospital.    [facility] is not responsible for any belongings or valuables.               Contacts, dentures or bridgework may not be worn into surgery.  Leave your suitcase in the car. After surgery it may be brought to your room.  For patients admitted to the hospital, discharge time is determined by your                treatment team.   Patients discharged the day of surgery will not be allowed to drive home.   Please read over the following fact sheets that you were given:   Surgical Site Infection Prevention   ____ Take these medicines the morning of surgery with A SIP OF WATER:    1.   2.   3.   4.  5.  6.  ____ Fleet Enema (as directed)   ___X_ Use CHG Soap as directed  ____ Use inhalers on the day of surgery  ____ Stop metformin 2 days prior to surgery    ____ Take 1/2 of usual insulin dose the night before surgery and none on the morning of surgery.   ____ Stop Coumadin/Plavix/aspirin on   ____ Stop  Anti-inflammatories on    ____ Stop supplements until after surgery.    ____ Bring C-Pap to the hospital.

## 2015-03-30 ENCOUNTER — Ambulatory Visit: Payer: Worker's Compensation | Admitting: Registered Nurse

## 2015-03-30 ENCOUNTER — Ambulatory Visit
Admission: RE | Admit: 2015-03-30 | Discharge: 2015-03-30 | Disposition: A | Discharge status: Home / Self Care | Payer: Worker's Compensation | Source: Ambulatory Visit | Attending: General Surgery | Admitting: General Surgery

## 2015-03-30 ENCOUNTER — Encounter
Admission: RE | Disposition: A | Discharge status: Home / Self Care | Payer: Self-pay | Source: Ambulatory Visit | Attending: General Surgery

## 2015-03-30 ENCOUNTER — Encounter: Payer: Self-pay | Admitting: *Deleted

## 2015-03-30 DIAGNOSIS — Z9889 Other specified postprocedural states: Secondary | ICD-10-CM | POA: Insufficient documentation

## 2015-03-30 DIAGNOSIS — K439 Ventral hernia without obstruction or gangrene: Secondary | ICD-10-CM

## 2015-03-30 DIAGNOSIS — K429 Umbilical hernia without obstruction or gangrene: Secondary | ICD-10-CM | POA: Insufficient documentation

## 2015-03-30 DIAGNOSIS — Z833 Family history of diabetes mellitus: Secondary | ICD-10-CM | POA: Insufficient documentation

## 2015-03-30 DIAGNOSIS — Z8249 Family history of ischemic heart disease and other diseases of the circulatory system: Secondary | ICD-10-CM | POA: Insufficient documentation

## 2015-03-30 DIAGNOSIS — Z8489 Family history of other specified conditions: Secondary | ICD-10-CM | POA: Insufficient documentation

## 2015-03-30 HISTORY — PX: EPIGASTRIC HERNIA REPAIR: SHX404

## 2015-03-30 HISTORY — PX: HERNIA REPAIR: SHX51

## 2015-03-30 SURGERY — REPAIR, HERNIA, EPIGASTRIC, ADULT
Anesthesia: General

## 2015-03-30 MED ORDER — ACETAMINOPHEN 10 MG/ML IV SOLN
INTRAVENOUS | Status: DC | PRN
  Administered 2015-03-30: 1000 mg via INTRAVENOUS

## 2015-03-30 MED ORDER — LACTATED RINGERS IV SOLN
INTRAVENOUS | Status: DC
  Administered 2015-03-30: 1000 mL via INTRAVENOUS
  Administered 2015-03-30: 07:00:00 via INTRAVENOUS

## 2015-03-30 MED ORDER — CEFAZOLIN SODIUM-DEXTROSE 2-3 GM-% IV SOLR
INTRAVENOUS | Status: AC
  Filled 2015-03-30: qty 50

## 2015-03-30 MED ORDER — FENTANYL CITRATE (PF) 100 MCG/2ML IJ SOLN
INTRAMUSCULAR | Status: DC | PRN
  Administered 2015-03-30: 100 ug via INTRAVENOUS

## 2015-03-30 MED ORDER — SUCCINYLCHOLINE CHLORIDE 20 MG/ML IJ SOLN
INTRAMUSCULAR | Status: DC | PRN
  Administered 2015-03-30: 100 mg via INTRAVENOUS

## 2015-03-30 MED ORDER — LIDOCAINE HCL (PF) 1 % IJ SOLN
INTRAMUSCULAR | Status: AC
  Filled 2015-03-30: qty 30

## 2015-03-30 MED ORDER — HYDROCODONE-ACETAMINOPHEN 5-325 MG PO TABS
1.0000 | ORAL_TABLET | Freq: Four times a day (QID) | ORAL | Status: DC | PRN
  Administered 2015-03-30: 1 via ORAL

## 2015-03-30 MED ORDER — ROCURONIUM BROMIDE 100 MG/10ML IV SOLN
INTRAVENOUS | Status: DC | PRN
  Administered 2015-03-30: 30 mg via INTRAVENOUS

## 2015-03-30 MED ORDER — KETOROLAC TROMETHAMINE 30 MG/ML IJ SOLN
INTRAMUSCULAR | Status: DC | PRN
  Administered 2015-03-30: 30 mg via INTRAVENOUS

## 2015-03-30 MED ORDER — SODIUM BICARBONATE 4 % IV SOLN
INTRAVENOUS | Status: AC
  Filled 2015-03-30: qty 5

## 2015-03-30 MED ORDER — GLYCOPYRROLATE 0.2 MG/ML IJ SOLN
INTRAMUSCULAR | Status: DC | PRN
  Administered 2015-03-30: 0.6 mg via INTRAVENOUS

## 2015-03-30 MED ORDER — ONDANSETRON HCL 4 MG/2ML IJ SOLN
4.0000 mg | Freq: Once | INTRAMUSCULAR | Status: AC | PRN
  Administered 2015-03-30: 4 mg via INTRAVENOUS

## 2015-03-30 MED ORDER — ONDANSETRON HCL 4 MG/2ML IJ SOLN
INTRAMUSCULAR | Status: AC
  Filled 2015-03-30: qty 2

## 2015-03-30 MED ORDER — MIDAZOLAM HCL 2 MG/2ML IJ SOLN
INTRAMUSCULAR | Status: DC | PRN
  Administered 2015-03-30: 2 mg via INTRAVENOUS

## 2015-03-30 MED ORDER — BUPIVACAINE HCL (PF) 0.5 % IJ SOLN
INTRAMUSCULAR | Status: DC | PRN
  Administered 2015-03-30: 30 mL

## 2015-03-30 MED ORDER — BUPIVACAINE HCL (PF) 0.5 % IJ SOLN
INTRAMUSCULAR | Status: AC
  Filled 2015-03-30: qty 30

## 2015-03-30 MED ORDER — FENTANYL CITRATE (PF) 100 MCG/2ML IJ SOLN
INTRAMUSCULAR | Status: DC
Start: 2015-03-30 — End: 2015-03-30
  Filled 2015-03-30: qty 2

## 2015-03-30 MED ORDER — CEFAZOLIN SODIUM-DEXTROSE 2-3 GM-% IV SOLR
2.0000 g | INTRAVENOUS | Status: AC
  Administered 2015-03-30: 2 g via INTRAVENOUS

## 2015-03-30 MED ORDER — HYDROCODONE-ACETAMINOPHEN 5-325 MG PO TABS
ORAL_TABLET | ORAL | Status: AC
  Filled 2015-03-30: qty 1

## 2015-03-30 MED ORDER — HYDROCODONE-ACETAMINOPHEN 5-325 MG PO TABS: 1.0000 | ORAL_TABLET | Freq: Four times a day (QID) | ORAL | Status: DC | PRN

## 2015-03-30 MED ORDER — FENTANYL CITRATE (PF) 100 MCG/2ML IJ SOLN
25.0000 ug | INTRAMUSCULAR | Status: AC | PRN
  Administered 2015-03-30 (×6): 25 ug via INTRAVENOUS

## 2015-03-30 MED ORDER — NEOSTIGMINE METHYLSULFATE 10 MG/10ML IV SOLN
INTRAVENOUS | Status: DC | PRN
  Administered 2015-03-30: 3 mg via INTRAVENOUS

## 2015-03-30 MED ORDER — LACTATED RINGERS IV SOLN
INTRAVENOUS | Status: DC | PRN
  Administered 2015-03-30: 07:00:00 via INTRAVENOUS

## 2015-03-30 MED ORDER — PROPOFOL 10 MG/ML IV BOLUS
INTRAVENOUS | Status: DC | PRN
  Administered 2015-03-30: 160 mg via INTRAVENOUS

## 2015-03-30 MED ORDER — ONDANSETRON HCL 4 MG/2ML IJ SOLN
INTRAMUSCULAR | Status: DC | PRN
  Administered 2015-03-30: 4 mg via INTRAVENOUS

## 2015-03-30 SURGICAL SUPPLY — 34 items
BLADE SURG 15 STRL SS SAFETY (BLADE) ×3 IMPLANT
CANISTER SUCT 1200ML W/VALVE (MISCELLANEOUS) ×3 IMPLANT
CHLORAPREP W/TINT 26ML (MISCELLANEOUS) ×3 IMPLANT
CLOSURE WOUND 1/2 X4 (GAUZE/BANDAGES/DRESSINGS) ×1
DRAPE LAPAROTOMY 100X77 ABD (DRAPES) ×3 IMPLANT
DRESSING TELFA 4X3 1S ST N-ADH (GAUZE/BANDAGES/DRESSINGS) ×3 IMPLANT
DRSG TEGADERM 4X4.75 (GAUZE/BANDAGES/DRESSINGS) ×3 IMPLANT
GLOVE BIO SURGEON STRL SZ7.5 (GLOVE) ×9 IMPLANT
GLOVE INDICATOR 8.0 STRL GRN (GLOVE) ×6 IMPLANT
GOWN STRL REUS W/ TWL LRG LVL3 (GOWN DISPOSABLE) ×2 IMPLANT
GOWN STRL REUS W/TWL LRG LVL3 (GOWN DISPOSABLE) ×6
LABEL OR SOLS (LABEL) IMPLANT
MESH VENTRALEX ST 2.5 CRC MED (Mesh General) ×3 IMPLANT
NDL SAFETY 22GX1.5 (NEEDLE) ×3 IMPLANT
NDL SAFETY 25GX1.5 (NEEDLE) ×3 IMPLANT
NEEDLE HYPO 22GX1.5 SAFETY (NEEDLE) ×3 IMPLANT
NS IRRIG 500ML POUR BTL (IV SOLUTION) ×3 IMPLANT
PACK BASIN MINOR ARMC (MISCELLANEOUS) ×3 IMPLANT
PAD GROUND ADULT SPLIT (MISCELLANEOUS) ×3 IMPLANT
SPONGE LAP 18X18 5 PK (GAUZE/BANDAGES/DRESSINGS) ×3 IMPLANT
STAPLER SKIN PROX 35W (STAPLE) ×3 IMPLANT
STRIP CLOSURE SKIN 1/2X4 (GAUZE/BANDAGES/DRESSINGS) ×2 IMPLANT
SUT MAXON ABS #0 GS21 30IN (SUTURE) ×9 IMPLANT
SUT SURGILON 0 BLK (SUTURE) ×3 IMPLANT
SUT VIC AB 2-0 BRD 54 (SUTURE) IMPLANT
SUT VIC AB 2-0 CT1 27 (SUTURE) ×3
SUT VIC AB 2-0 CT1 TAPERPNT 27 (SUTURE) ×1 IMPLANT
SUT VIC AB 3-0 SH 27 (SUTURE) ×3
SUT VIC AB 3-0 SH 27X BRD (SUTURE) ×1 IMPLANT
SUT VIC AB 4-0 FS2 27 (SUTURE) ×3 IMPLANT
SUT VICRYL+ 3-0 144IN (SUTURE) ×3 IMPLANT
SWABSTK COMLB BENZOIN TINCTURE (MISCELLANEOUS) ×3 IMPLANT
SYR 3ML LL SCALE MARK (SYRINGE) ×3 IMPLANT
SYR CONTROL 10ML (SYRINGE) ×3 IMPLANT

## 2015-03-30 NOTE — H&P (Signed)
44 y/o male for epigastric hernia repair. No change in health status. Cardiopulmonary exam normal. ABD: Vague protrusion 3 cm cephalad to umbilical skin fold. No evidence of umbilical hernia recurrence. For repair.

## 2015-03-30 NOTE — Anesthesia Procedure Notes (Signed)
Procedure Name: Intubation Date/Time: 03/30/2015 7:45 AM Performed by: [email or web address], Aryel Edelen Pre-anesthesia Checklist: Patient identified, Emergency Drugs available, Suction available, Patient being monitored and Timeout performed Patient Re-evaluated:Patient Re-evaluated prior to inductionOxygen Delivery Method: Circle system utilized Preoxygenation: Pre-oxygenation with 100% oxygen Intubation Type: IV induction Laryngoscope Size: Mac and 3 Grade View: Grade I Tube type: Oral Number of attempts: 1 Secured at: 22 cm Tube secured with: Tape

## 2015-03-30 NOTE — Anesthesia Preprocedure Evaluation (Signed)
Anesthesia Evaluation  Patient identified by MRN, date of birth, ID band Patient awake    Reviewed: Allergy & Precautions, NPO status , Patient's Chart, lab work & pertinent test results  Airway Mallampati: II  TM Distance: >3 FB Neck ROM: Full   Comment: beard Dental  (+) Chipped   Pulmonary neg pulmonary ROS,  breath sounds clear to auscultation  Pulmonary exam normal       Cardiovascular negative cardio ROS Normal cardiovascular exam    Neuro/Psych negative neurological ROS  negative psych ROS   GI/Hepatic negative GI ROS,   Endo/Other  negative endocrine ROS  Renal/GU stones  negative genitourinary   Musculoskeletal negative musculoskeletal ROS (+)   Abdominal (+) + obese,   Peds negative pediatric ROS (+)  Hematology negative hematology ROS (+)   Anesthesia Other Findings   Reproductive/Obstetrics                             Anesthesia Physical Anesthesia Plan  ASA: II  Anesthesia Plan: General   Post-op Pain Management:    Induction: Intravenous  Airway Management Planned: Oral ETT  Additional Equipment:   Intra-op Plan:   Post-operative Plan: Extubation in OR  Informed Consent: I have reviewed the patients History and Physical, chart, labs and discussed the procedure including the risks, benefits and alternatives for the proposed anesthesia with the patient or authorized representative who has indicated his/her understanding and acceptance.   Dental advisory given  Plan Discussed with: CRNA and Surgeon  Anesthesia Plan Comments:         Anesthesia Quick Evaluation

## 2015-03-30 NOTE — Progress Notes (Signed)
Awake. Talking. resp adequate/nonlabored. O2 sat 88% on room air. O2 restarted with nasal cannula at 3 l/m. Encouraged to deep breath. O2 sat increased to 95%. Eyeglasses returned to pt. Eyeglass case at bedside.

## 2015-03-30 NOTE — Transfer of Care (Signed)
Immediate Anesthesia Transfer of Care Note  Patient: Andrew Holloway.  Procedure(s) Performed: Procedure(s): HERNIA REPAIR EPIGASTRIC ADULT (N/A)  Patient Location: PACU  Anesthesia Type:General  Level of Consciousness: awake and alert   Airway & Oxygen Therapy: Patient connected to face mask oxygen  Post-op Assessment: Report given to RN  Post vital signs: stable  Last Vitals:  Filed Vitals:   03/30/15 0842  BP: 126/86  Pulse: 85  Temp: 36.4 C  Resp: 16    Complications: No apparent anesthesia complications

## 2015-03-30 NOTE — Op Note (Signed)
Preoperative diagnosis: Epigastric hernia.  Postoperative diagnosis: Same with recurrent umbilical hernia.  Operative procedure: Epigastric and umbilical hernia repair with Ventralex ST hernia patch, 6.4 cm.  Operating surgeon: Lane Hacker, M.D.  Anesthesia: Gen. endotracheal.  Estimated blood loss: Less than 10 mL.  Clinical note: This 44 year old male underwent primary repair of a small umbilical hernia 2 years ago. He returned with a supraumbilical bulge and clinical exam suggesting a fascial defect. He was admitted for elective repair. Of note, the patient had shaved his abdomen 7-10 days ago in preparation for surgery. He received Kefzol intravenously.  Operative note: The patient underwent general endotracheal anesthesia without difficulty. The abdomen was prepped with clear prep and drape. A vertical 5 cm incision was made above the level of the umbilicus and then extended slightly inferiorly into the umbilical skin. Skin was incised sharply and hemostasis achieved electrocautery. The patient was found to have 2 fascial defects above level of the umbilicus as well as recurrence of the umbilical defect. These were reduced. The inferior surface of the fascia was cleared circumferentially for a 6 cm distance and the anterior surface of the fascia cleared in a similar fashion. A 6.4 cm Ventralex mesh was sutured circumferentially with interrupted 0 Maxon sutures. The fascial defect was then closed with interrupted 0 Maxon figure-of-eight sutures. In the area of the umbilical defect the sutures were vertically orientated and in the area just superior to this a transverse T closure was utilized as this best approximated the fascia. The underlying mesh was anchored to the fascial closure with each stitch. The umbilical skin was tacked to the fascia with a 2-0 Vicryls figure-of-eight suture. The deep layer of adipose tissue was approximated with interrupted 2-0 Vicryls sutures. The superficial layer  of adipose tissue was approximated 3-0 Vicryls sutures. The skin was closed with a running 4-0 Vicryls septic suture. Enzyme, Steri-Strips, Telfa and tape and dressings were applied.  The patient explains laryngospasm on extubation with significant coughing. Direct pressure was held over the hernia site while the coughing episode resolved.  The patient was taken to recovery room stable condition.

## 2015-03-30 NOTE — Op Note (Signed)
Preoperative diagnosis: Epigastric hernia.  Postoperative diagnosis: Same with recurrent umbilical hernia.  Operative procedure: Epigastric and umbilical hernia repair with Ventralex ST hernia patch, 6.4 cm.  Operating surgeon: Jeffery Gerald Honea, M.D.  Anesthesia: Gen. endotracheal.  Estimated blood loss: Less than 10 mL.  Clinical note: This 44-year-old male underwent primary repair of a small umbilical hernia 2 years ago. He returned with a supraumbilical bulge and clinical exam suggesting a fascial defect. He was admitted for elective repair. Of note, the patient had shaved his abdomen 7-10 days ago in preparation for surgery. He received Kefzol intravenously.  Operative note: The patient underwent general endotracheal anesthesia without difficulty. The abdomen was prepped with clear prep and drape. A vertical 5 cm incision was made above the level of the umbilicus and then extended slightly inferiorly into the umbilical skin. Skin was incised sharply and hemostasis achieved electrocautery. The patient was found to have 2 fascial defects above level of the umbilicus as well as recurrence of the umbilical defect. These were reduced. The inferior surface of the fascia was cleared circumferentially for a 6 cm distance and the anterior surface of the fascia cleared in a similar fashion. A 6.4 cm Ventralex mesh was sutured circumferentially with interrupted 0 Maxon sutures. The fascial defect was then closed with interrupted 0 Maxon figure-of-eight sutures. In the area of the umbilical defect the sutures were vertically orientated and in the area just superior to this a transverse T closure was utilized as this best approximated the fascia. The underlying mesh was anchored to the fascial closure with each stitch. The umbilical skin was tacked to the fascia with a 2-0 Vicryls figure-of-eight suture. The deep layer of adipose tissue was approximated with interrupted 2-0 Vicryls sutures. The superficial layer  of adipose tissue was approximated 3-0 Vicryls sutures. The skin was closed with a running 4-0 Vicryls septic suture. Enzyme, Steri-Strips, Telfa and tape and dressings were applied.  The patient explains laryngospasm on extubation with significant coughing. Direct pressure was held over the hernia site while the coughing episode resolved.  The patient was taken to recovery room stable condition. 

## 2015-03-30 NOTE — Discharge Instructions (Signed)

## 2015-03-30 NOTE — Progress Notes (Signed)
O2 sat 88-90% on nasal cannula at 3 l/m. Instructed on incentive spirometer. Incentive spirometer done and 2500 ml obtained. O2 sat increased to 96-100%. Tolerated well. Sips of ginger-ale given. Tolerated well.

## 2015-03-30 NOTE — Anesthesia Postprocedure Evaluation (Signed)
  Anesthesia Post-op Note  Patient: Andrew Holloway.  Procedure(s) Performed: Procedure(s): HERNIA REPAIR EPIGASTRIC ADULT (N/A)  Anesthesia type:General  Patient location: PACU  Post pain: Pain level controlled  Post assessment: Post-op Vital signs reviewed, Patient's Cardiovascular Status Stable, Respiratory Function Stable, Patent Airway and No signs of Nausea or vomiting  Post vital signs: Reviewed and stable  Last Vitals:  Filed Vitals:   03/30/15 1117  BP: 121/74  Pulse: 67  Temp:   Resp: 16    Level of consciousness: awake, alert  and patient cooperative  Complications: No apparent anesthesia complications

## 2015-04-05 ENCOUNTER — Encounter: Payer: Self-pay | Admitting: General Surgery

## 2015-04-05 ENCOUNTER — Ambulatory Visit (INDEPENDENT_AMBULATORY_CARE_PROVIDER_SITE_OTHER): Payer: Worker's Compensation | Admitting: General Surgery

## 2015-04-05 VITALS — BP 130/90 | HR 88 | Resp 14 | Ht 67.5 in | Wt 225.0 lb

## 2015-04-05 DIAGNOSIS — K439 Ventral hernia without obstruction or gangrene: Secondary | ICD-10-CM

## 2015-04-05 NOTE — Progress Notes (Signed)
Patient ID: Andrew Schwartz., male   DOB: 09-27-1970, 44 y.o.   MRN: [id number]  Chief Complaint  Patient presents with  . Routine Post Op    Epigastric Hernia repair    HPI Andrew Thoma. is a 44 y.o. male here today for Post Op for Epigastric Hernia repair. The procedure was done on 03/30/2015. Patient is healing after procedure, he did have some irritation from the adhesive that was on the abdomen. Patient states his pain is 6/10 now mainly in the abdomen area, with muscle aches mainly in the flank area. The patient had laryngospasm on awakening from anesthesia and this was associated with violent coughing against closed cords. This is likely the source of his severe lateral abdominal wall discomfort. HPI  Past Medical History  Diagnosis Date  . Umbilical hernia 10/17/2011  . Chicken pox   . Kidney stones     Past Surgical History  Procedure Laterality Date  . Shoulder surgery      rotator cuff  . Ankle surgery      x2  . Hernia repair  2014    umbilical hernia  . Nasal spectrum    . Rotator cuff right    . Left and right ankle repair ligament    . Epigastric hernia repair N/A 03/30/2015    Procedure: HERNIA REPAIR EPIGASTRIC ADULT;  Surgeon: Earline Mayotte, MD;  Location: [facility] ORS;  Service: General;  Laterality: N/A;    Family History  Problem Relation Age of Onset  . Heart disease Paternal Grandfather   . Heart disease Maternal Grandfather   . Diabetes Maternal Grandmother   . Hypertension Mother   . Hyperlipidemia Father     Social History Social History  Substance Use Topics  . Smoking status: Never Smoker   . Smokeless tobacco: Never Used  . Alcohol Use: 0.0 oz/week    0 Standard drinks or equivalent per week     Comment: 2x month    Not on File  Current Outpatient Prescriptions  Medication Sig Dispense Refill  . diphenhydrAMINE (BENADRYL) 25 mg capsule Take 25 mg by mouth every 6 (six) hours as needed.    [name] HYDROcodone-acetaminophen (NORCO)  5-325 MG per tablet Take 1-2 tablets by mouth every 6 (six) hours as needed for moderate pain or severe pain. 30 tablet 0  . naproxen sodium (ANAPROX) 220 MG tablet Take 220 mg by mouth 2 (two) times daily with a meal.     No current facility-administered medications for this visit.    Review of Systems Review of Systems  Constitutional: Negative.   Respiratory: Negative.   Cardiovascular: Negative.     Blood pressure 130/90, pulse 88, resp. rate 14, height 5' 7.5" (1.715 m), weight 225 lb (102.059 kg).  Physical Exam Physical Exam  Constitutional: He is oriented to person, place, and time. He appears well-developed and well-nourished.  Eyes: EOM are normal.  Neck: Normal range of motion. Neck supple.  Cardiovascular: Normal rate, regular rhythm and normal heart sounds.   Pulmonary/Chest: Effort normal and breath sounds normal.  Musculoskeletal: Normal range of motion.  Neurological: He is alert and oriented to person, place, and time. He has normal reflexes.  Skin: Skin is warm and dry.    Data Reviewed In the operating room he was found to have both an epigastric as well as recurrent umbilical hernia.   Assessment   Doing well status post epigastric/recurrent umbilical hernia repair.  Muscular soreness secondary to coughing against closed  cords.     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 With his present soreness I anticipate he will be able to return to work until the third week in September. He'll make use of locally for comfort and work on gentle stretching exercises.     Follow up in 2 weeks   PCP: Duncan Dull  Earline Mayotte 04/05/2015, 4:52 PM

## 2015-04-05 NOTE — Patient Instructions (Addendum)
Follow up in 2 weeks. Use the heating pad more often to relax muscles and do some gentle stretching.

## 2015-04-19 ENCOUNTER — Encounter: Payer: Self-pay | Admitting: *Deleted

## 2015-04-19 ENCOUNTER — Ambulatory Visit (INDEPENDENT_AMBULATORY_CARE_PROVIDER_SITE_OTHER): Payer: Worker's Compensation | Admitting: General Surgery

## 2015-04-19 ENCOUNTER — Encounter: Payer: Self-pay | Admitting: General Surgery

## 2015-04-19 VITALS — BP 132/68 | HR 74 | Resp 14 | Ht 67.5 in | Wt 227.0 lb

## 2015-04-19 DIAGNOSIS — K429 Umbilical hernia without obstruction or gangrene: Secondary | ICD-10-CM

## 2015-04-19 DIAGNOSIS — K439 Ventral hernia without obstruction or gangrene: Secondary | ICD-10-CM

## 2015-04-19 NOTE — Patient Instructions (Addendum)
Call with any questions and concerns, continue to use heating pad and antiinflammatories.

## 2015-04-19 NOTE — Progress Notes (Signed)
Patient ID: Andrew Holloway., male   DOB: 1971-07-09, 44 y.o.   MRN: [id number]  Chief Complaint  Patient presents with  . Routine Post Op    hernia repair    HPI [name]. is a 44 y.o. male here today for post op for epigastric hernia repair done on 03/30/15. Patient reports he is in pain in his right side of abdomen. It has been painful for about 6 or 7 days, pain on a scale of 6/7. He has been using a heating about 3 times a day and still taking pain medication. He notices the pain more while increasing his activity.  HPI  Past Medical History  Diagnosis Date  . Umbilical hernia 10/17/2011  . Chicken pox   . Kidney stones     Past Surgical History  Procedure Laterality Date  . Shoulder surgery      rotator cuff  . Ankle surgery      x2  . Hernia repair  2014    umbilical hernia  . Nasal spectrum    . Rotator cuff right    . Left and right ankle repair ligament    . Epigastric hernia repair N/A 03/30/2015    Procedure: HERNIA REPAIR EPIGASTRIC ADULT;  Surgeon: Earline Mayotte, MD;  Location: [facility] ORS;  Service: General;  Laterality: N/A;    Family History  Problem Relation Age of Onset  . Heart disease Paternal Grandfather   . Heart disease Maternal Grandfather   . Diabetes Maternal Grandmother   . Hypertension Mother   . Hyperlipidemia Father     Social History Social History  Substance Use Topics  . Smoking status: Never Smoker   . Smokeless tobacco: Never Used  . Alcohol Use: 0.0 oz/week    0 Standard drinks or equivalent per week     Comment: 2x month    No Known Allergies  Current Outpatient Prescriptions  Medication Sig Dispense Refill  . HYDROcodone-acetaminophen (NORCO) 5-325 MG per tablet Take 1-2 tablets by mouth every 6 (six) hours as needed for moderate pain or severe pain. 30 tablet 0  . naproxen sodium (ANAPROX) 220 MG tablet Take 220 mg by mouth 2 (two) times daily with a meal.     No current facility-administered medications for  this visit.    Review of Systems Review of Systems  Constitutional: Negative.   Respiratory: Negative.   Cardiovascular: Negative.   Gastrointestinal: Negative.     Blood pressure 132/68, pulse 74, resp. rate 14, height 5' 7.5" (1.715 m), weight 227 lb (102.967 kg).  Physical Exam Physical Exam  Constitutional: He is oriented to person, place, and time. He appears well-developed and well-nourished.  HENT:  Mouth/Throat: Oropharynx is clear and moist.  Eyes: Conjunctivae are normal. No scleral icterus.  Cardiovascular: Normal rate, regular rhythm and normal heart sounds.   Pulmonary/Chest: Effort normal and breath sounds normal.  Abdominal: Normal appearance. There is tenderness in the right lower quadrant and left lower quadrant.    Well healed incision Minimal drainage at top of incision   Neurological: He is alert and oriented to person, place, and time.  Skin: Skin is warm and dry.  Psychiatric: His behavior is normal.    Data Reviewed Operative reports showing vigorous coughing against close cords on awaking from anesthesia.  Last postop visit note where significant abdominal wall tenderness reported, likely secondary to coughing.  Assessment    The late onset of pain is a little peculiar, but at this  time I cannot appreciate any fascial defect or evidence of infection.    Plan    patient was encouraged to work on gentle stretching exercises and continue Neosporin application to the superior aspect of the incision. As his job involves moving heavy cylinders, we'll plan to extend his off work time for another 2 weeks. A new prescription for Norco 5/325, #30 with the inscription 1-2 by mouth every 4 hours when necessary for pain with no refills was provided.     Call with any questions and concerns, continue to use heating pad and antiinflammatories.   Return in 2 weeks for a follow up. Increase activity as tolerated.  Return back to work May 10 2015.  PCP:  Staci Acosta 04/19/2015, 9:17 PM

## 2015-04-27 ENCOUNTER — Telehealth: Payer: Self-pay | Admitting: General Surgery

## 2015-04-27 NOTE — Telephone Encounter (Signed)
PT CALLED AND WANTED TO LET YOU KNOW WHERE HE HAD EPIGASTRIC HERNIA SX DONE 03-30-15 THE TOP OF HIS EXCISION HAS STILL NOT HEALED. IT'S STILL AS IT WAS ON HIS LAST VISIT  04-19-15.HE'S SCH'D TO RETURN 05-03-15. WHAT ADVICE WOULD YOU GIVE HIM?

## 2015-04-28 NOTE — Telephone Encounter (Signed)
Please contact the patient and see if he is free to come in any time tomorrow for re-assessment..  Sorry I did not get back to him yesterday.

## 2015-04-28 NOTE — Telephone Encounter (Signed)
See if he can come in around 11:15 for Dr Lemar Livings to check.

## 2015-04-29 ENCOUNTER — Encounter: Payer: Self-pay | Admitting: General Surgery

## 2015-04-29 ENCOUNTER — Ambulatory Visit (INDEPENDENT_AMBULATORY_CARE_PROVIDER_SITE_OTHER): Payer: Worker's Compensation | Admitting: General Surgery

## 2015-04-29 VITALS — BP 120/74 | HR 82 | Resp 12 | Ht 68.0 in | Wt 227.0 lb

## 2015-04-29 DIAGNOSIS — T814XXA Infection following a procedure, initial encounter: Secondary | ICD-10-CM

## 2015-04-29 DIAGNOSIS — IMO0001 Reserved for inherently not codable concepts without codable children: Secondary | ICD-10-CM

## 2015-04-29 DIAGNOSIS — K439 Ventral hernia without obstruction or gangrene: Secondary | ICD-10-CM

## 2015-04-29 NOTE — Progress Notes (Signed)
Patient ID: Andrew Holloway., male   DOB: 04/14/1971, 44 y.o.   MRN: [id number]  Chief Complaint  Patient presents with  . Routine Post Op    ventral hernia    HPI Andrew Holloway. is a 44 y.o. male here today for his post op ventral hernia repair done on 03/30/15. Patient states he is draining some at the top of his incision, still painful to increase activity. Notices tenderness down the front of his abdomen.  HPI  Past Medical History  Diagnosis Date  . Umbilical hernia 10/17/2011  . Chicken pox   . Kidney stones     Past Surgical History  Procedure Laterality Date  . Shoulder surgery      rotator cuff  . Ankle surgery      x2  . Nasal spectrum    . Rotator cuff right    . Left and right ankle repair ligament    . Epigastric hernia repair N/A 03/30/2015    Procedure: HERNIA REPAIR EPIGASTRIC ADULT;  Surgeon: Earline Mayotte, MD;  Location: [facility] ORS;  Service: General;  Laterality: N/A;  . Hernia repair  2014    umbilical hernia  . Hernia repair  03/30/2015    Epigastric and umbilical hernia with retrorectus 6.4 cm Ventralex mesh    Family History  Problem Relation Age of Onset  . Heart disease Paternal Grandfather   . Heart disease Maternal Grandfather   . Diabetes Maternal Grandmother   . Hypertension Mother   . Hyperlipidemia Father     Social History Social History  Substance Use Topics  . Smoking status: Never Smoker   . Smokeless tobacco: Never Used  . Alcohol Use: 0.0 oz/week    0 Standard drinks or equivalent per week     Comment: 2x month    No Known Allergies  Current Outpatient Prescriptions  Medication Sig Dispense Refill  . HYDROcodone-acetaminophen (NORCO) 5-325 MG per tablet Take 1-2 tablets by mouth every 6 (six) hours as needed for moderate pain or severe pain. 30 tablet 0  . naproxen sodium (ANAPROX) 220 MG tablet Take 220 mg by mouth 2 (two) times daily with a meal.     No current facility-administered medications for this visit.     Review of Systems Review of Systems  Constitutional: Negative.   Respiratory: Negative.   Cardiovascular: Negative.     Blood pressure 120/74, pulse 82, resp. rate 12, height  (1.727 m), weight 227 lb (102.967 kg).  Physical Exam Physical Exam  Constitutional: He is oriented to person, place, and time. He appears well-developed and well-nourished.  Abdominal:    Neurological: He is alert and oriented to person, place, and time.  Skin: Skin is warm and dry.    Data Reviewed Operative report.  Assessment    Delayed healing status post recurrent ventral hernia repair.     Plan    I proposed exploration of the area and the patient was amenable. Betadine was applied to the skin. 10 mL of      0.5% Xylocaine with 0.25% Marcaine with 1-200,000 of epinephrine was utilized well tolerated. The area was reprepped with Betadine solution. A subcuticular suture of Vicryl was removed. The wound edges were excised sharply. The remaining tissue appeared healthy. The wound edges were approximated with interrupted 4-0 horizontal mattress nylon sutures. Telfa and Tegaderm dressing applied. The procedure was well tolerated. The patient will remove the outer plastic dressing in 48 hours. He'll continue local heat. Continue  stretching exercises. Follow-up in one week for wound evaluation. Culture was obtained to assess for infection not clinically evident. Antibiotic spelled pending culture results.  PCP:  Staci Acosta 04/29/2015, 8:22 PM

## 2015-05-03 ENCOUNTER — Telehealth: Payer: Self-pay

## 2015-05-03 ENCOUNTER — Ambulatory Visit: Payer: Self-pay | Admitting: General Surgery

## 2015-05-03 ENCOUNTER — Ambulatory Visit (INDEPENDENT_AMBULATORY_CARE_PROVIDER_SITE_OTHER): Payer: Worker's Compensation | Admitting: General Surgery

## 2015-05-03 ENCOUNTER — Encounter: Payer: Self-pay | Admitting: General Surgery

## 2015-05-03 VITALS — BP 134/76 | HR 87 | Resp 14 | Ht 68.0 in | Wt 226.0 lb

## 2015-05-03 DIAGNOSIS — IMO0001 Reserved for inherently not codable concepts without codable children: Secondary | ICD-10-CM

## 2015-05-03 DIAGNOSIS — T814XXA Infection following a procedure, initial encounter: Secondary | ICD-10-CM

## 2015-05-03 DIAGNOSIS — T8149XA Infection following a procedure, other surgical site, initial encounter: Secondary | ICD-10-CM | POA: Insufficient documentation

## 2015-05-03 LAB — ANAEROBIC AND AEROBIC CULTURE

## 2015-05-03 MED ORDER — SULFAMETHOXAZOLE-TRIMETHOPRIM 800-160 MG PO TABS: 1.0000 | ORAL_TABLET | Freq: Two times a day (BID) | ORAL | Status: DC

## 2015-05-03 NOTE — Telephone Encounter (Signed)
-----   Message from Earline Mayotte, MD sent at 05/03/2015  3:26 PM EDT ----- Please notify the patient that last week's culture did come back positive for a simple staph infection and the antibiotic prescribed today was appropriate. Follow-up next week as scheduled. ----- Message -----    From: [facility] Lab Results In Interface    Sent: 05/01/2015  10:36 AM      To: Earline Mayotte, MD

## 2015-05-03 NOTE — Progress Notes (Signed)
Patient ID: Andrew Holloway., male   DOB: Jan 19, 1971, 44 y.o.   MRN: [id number]  Chief Complaint  Patient presents with  . Follow-up    HPI Andrew Holloway. is a 44 y.o. male here today following up from his infected wound. He states on Saturday he got up and there was blood and infection on his bandage. The patient reported swelling the size of a nagging the area of recent reexcision and perhaps some slight erythema, although was difficult to tell with bloody drainage on the bandage. He ran a temperature of 99.62 days ago.  Prosthetic mesh was placed in the retrorectus position at the time of surgery.   HPI  Past Medical History  Diagnosis Date  . Umbilical hernia 10/17/2011  . Chicken pox   . Kidney stones     Past Surgical History  Procedure Laterality Date  . Shoulder surgery      rotator cuff  . Ankle surgery      x2  . Nasal spectrum    . Rotator cuff right    . Left and right ankle repair ligament    . Epigastric hernia repair N/A 03/30/2015    Procedure: HERNIA REPAIR EPIGASTRIC ADULT;  Surgeon: Earline Mayotte, MD;  Location: [facility] ORS;  Service: General;  Laterality: N/A;  . Hernia repair  2014    umbilical hernia  . Hernia repair  03/30/2015    Epigastric and umbilical hernia with retrorectus 6.4 cm Ventralex mesh    Family History  Problem Relation Age of Onset  . Heart disease Paternal Grandfather   . Heart disease Maternal Grandfather   . Diabetes Maternal Grandmother   . Hypertension Mother   . Hyperlipidemia Father     Social History Social History  Substance Use Topics  . Smoking status: Never Smoker   . Smokeless tobacco: Never Used  . Alcohol Use: 0.0 oz/week    0 Standard drinks or equivalent per week     Comment: 2x month    No Known Allergies  Current Outpatient Prescriptions  Medication Sig Dispense Refill  . HYDROcodone-acetaminophen (NORCO) 5-325 MG per tablet Take 1-2 tablets by mouth every 6 (six) hours as needed for  moderate pain or severe pain. 30 tablet 0  . naproxen sodium (ANAPROX) 220 MG tablet Take 220 mg by mouth 2 (two) times daily with a meal.    . sulfamethoxazole-trimethoprim (BACTRIM DS,SEPTRA DS) 800-160 MG per tablet Take 1 tablet by mouth 2 (two) times daily. 20 tablet 0   No current facility-administered medications for this visit.    Review of Systems Review of Systems  Constitutional: Negative.   Respiratory: Negative.   Cardiovascular: Negative.     Blood pressure 134/76, pulse 87, resp. rate 14, height  (1.727 m), weight 226 lb (102.513 kg).  Physical Exam Physical Exam  Abdominal:     The sutures were removed.  Data Reviewed Culture reports reported after the patient's visit was positive for MSSA, sensitive to all.  Assessment    Superficial wound infection without evidence of deep extension.    Plan    The patient was placed on Bactrim DS one by mouth twice a day. Routine showers, peroxide to the wound and coverage to prevent clothing contamination of the site is planned. He'll increase his activity as tolerated.   Patient to return in one week.   PCP:  Staci Acosta 05/03/2015, 8:30 PM

## 2015-05-03 NOTE — Patient Instructions (Signed)
Patient to return in 8 days.  

## 2015-05-03 NOTE — Telephone Encounter (Signed)
Notified patient as instructed, patient pleased. Discussed follow-up appointments, patient agrees  

## 2015-05-05 ENCOUNTER — Ambulatory Visit: Payer: Self-pay | Admitting: General Surgery

## 2015-05-11 ENCOUNTER — Encounter: Payer: Self-pay | Admitting: General Surgery

## 2015-05-11 ENCOUNTER — Ambulatory Visit (INDEPENDENT_AMBULATORY_CARE_PROVIDER_SITE_OTHER): Payer: Worker's Compensation | Admitting: General Surgery

## 2015-05-11 VITALS — BP 140/80 | HR 76 | Temp 98.0°F | Resp 12 | Ht 68.0 in | Wt 226.0 lb

## 2015-05-11 DIAGNOSIS — IMO0001 Reserved for inherently not codable concepts without codable children: Secondary | ICD-10-CM

## 2015-05-11 DIAGNOSIS — T814XXA Infection following a procedure, initial encounter: Secondary | ICD-10-CM

## 2015-05-11 MED ORDER — SULFAMETHOXAZOLE-TRIMETHOPRIM 800-160 MG PO TABS: 1.0000 | ORAL_TABLET | Freq: Two times a day (BID) | ORAL | Status: DC

## 2015-05-11 NOTE — Progress Notes (Signed)
Patient ID: Andrew Schwartz., male   DOB: [birth year], 44 y.o.   MRN: [id number]  Chief Complaint  Patient presents with  . Follow-up    seroma     HPI Andrew Mahon. is a 44 y.o. male here today following up from his infected wound. Patient states the redness is starting to go away , still draining a little.  Marked improvement in his abdominal wall discomfort, able to move without pain or hesitation. HPI  Past Medical History  Diagnosis Date  . Umbilical hernia 10/17/2011  . Chicken pox   . Kidney stones     Past Surgical History  Procedure Laterality Date  . Shoulder surgery      rotator cuff  . Ankle surgery      x2  . Nasal spectrum    . Rotator cuff right    . Left and right ankle repair ligament    . Epigastric hernia repair N/A 03/30/2015    Procedure: HERNIA REPAIR EPIGASTRIC ADULT;  Surgeon: Earline Mayotte, MD;  Location: [facility] ORS;  Service: General;  Laterality: N/A;  . Hernia repair  2014    umbilical hernia  . Hernia repair  03/30/2015    Epigastric and umbilical hernia with retrorectus 6.4 cm Ventralex mesh    Family History  Problem Relation Age of Onset  . Heart disease Paternal Grandfather   . Heart disease Maternal Grandfather   . Diabetes Maternal Grandmother   . Hypertension Mother   . Hyperlipidemia Father     Social History Social History  Substance Use Topics  . Smoking status: Never Smoker   . Smokeless tobacco: Never Used  . Alcohol Use: 0.0 oz/week    0 Standard drinks or equivalent per week     Comment: 2x month    No Known Allergies  Current Outpatient Prescriptions  Medication Sig Dispense Refill  . HYDROcodone-acetaminophen (NORCO) 5-325 MG per tablet Take 1-2 tablets by mouth every 6 (six) hours as needed for moderate pain or severe pain. 30 tablet 0  . naproxen sodium (ANAPROX) 220 MG tablet Take 220 mg by mouth 2 (two) times daily with a meal.    . sulfamethoxazole-trimethoprim (BACTRIM DS,SEPTRA DS) 800-160 MG  tablet Take 1 tablet by mouth 2 (two) times daily. 20 tablet 0   No current facility-administered medications for this visit.    Review of Systems Review of Systems  Constitutional: Negative.   Respiratory: Negative.   Cardiovascular: Negative.     Blood pressure 140/80, pulse 76, temperature 98 F (36.7 C), resp. rate 12, height  (1.727 m), weight 226 lb (102.513 kg).  Physical Exam Physical Exam  Abdominal:      Data Reviewed Culture showed non-MRSA staph. Sensitive to Bactrim.  Assessment    Resolution of superficial abdominal wall infection without evidence of deep extension.    Plan    The patient will return to work next week as previously scheduled. Proper lifting technique reviewed. I will extend his presence supply of Bactrim for an additional 2 weeks as prosthetic mesh is present in the retro-rectus location.    Patient to return in three weeks.  PCP:  Staci Acosta 05/11/2015, 8:25 PM

## 2015-05-11 NOTE — Patient Instructions (Addendum)
Patient to return in three weeks.  

## 2015-05-31 ENCOUNTER — Encounter: Payer: Self-pay | Admitting: General Surgery

## 2015-05-31 ENCOUNTER — Ambulatory Visit (INDEPENDENT_AMBULATORY_CARE_PROVIDER_SITE_OTHER): Payer: Worker's Compensation | Admitting: General Surgery

## 2015-05-31 VITALS — BP 140/80 | HR 76 | Resp 12 | Ht 68.0 in | Wt 228.0 lb

## 2015-05-31 DIAGNOSIS — T814XXA Infection following a procedure, initial encounter: Secondary | ICD-10-CM

## 2015-05-31 DIAGNOSIS — IMO0001 Reserved for inherently not codable concepts without codable children: Secondary | ICD-10-CM

## 2015-05-31 NOTE — Progress Notes (Signed)
Patient ID: Andrew SchwartzWilliam S Apodaca Jr., male   DOB: 01/03/1971, 44 y.o.   MRN: [id number]  Chief Complaint  Patient presents with  . Routine Post Op    ventral hernia     HPI Andrew SchwartzWilliam S Huegel Jr. is a 44 y.o. male. Andrew BlanksWilliam S Schweiger Montez HagemanJr. is a 44 y.o. male here today following up from his infected wound. Patient states the area is healing well , no more drainage.   HPI  Past Medical History  Diagnosis Date  . Umbilical hernia 10/17/2011  . Chicken pox   . Kidney stones     Past Surgical History  Procedure Laterality Date  . Shoulder surgery      rotator cuff  . Ankle surgery      x2  . Nasal spectrum    . Rotator cuff right    . Left and right ankle repair ligament    . Epigastric hernia repair N/A 03/30/2015    Procedure: HERNIA REPAIR EPIGASTRIC ADULT;  Surgeon: Earline MayotteJeffrey W Markel Kurtenbach, MD;  Location: [facility];  Service: General;  Laterality: N/A;  . Hernia repair  2014    umbilical hernia  . Hernia repair  03/30/2015    Epigastric and umbilical hernia with retrorectus 6.4 cm Ventralex mesh    Family History  Problem Relation Age of Onset  . Heart disease Paternal Grandfather   . Heart disease Maternal Grandfather   . Diabetes Maternal Grandmother   . Hypertension Mother   . Hyperlipidemia Father     Social History Social History  Substance Use Topics  . Smoking status: Never Smoker   . Smokeless tobacco: Never Used  . Alcohol Use: 0.0 oz/week    0 Standard drinks or equivalent per week     Comment: 2x month    No Known Allergies  Current Outpatient Prescriptions  Medication Sig Dispense Refill  . naproxen sodium (ANAPROX) 220 MG tablet Take 220 mg by mouth 2 (two) times daily with a meal.    . HYDROcodone-acetaminophen (NORCO) 5-325 MG per tablet Take 1-2 tablets by mouth every 6 (six) hours as needed for moderate pain or severe pain. (Patient not taking: Reported on 05/31/2015) 30 tablet 0  . sulfamethoxazole-trimethoprim (BACTRIM DS,SEPTRA DS) 800-160 MG tablet Take 1  tablet by mouth 2 (two) times daily. (Patient not taking: Reported on 05/31/2015) 20 tablet 0   No current facility-administered medications for this visit.    Review of Systems Review of Systems  Blood pressure 140/80, pulse 76, resp. rate 12, height 5\' 8"  (1.727 m), weight 228 lb (103.42 kg).  Physical Exam Physical Exam  Abdominal:         Assessment    Resolution of superficial wound infection.      Plan    Exercises reviewed. No situps.  To call if new symptoms. Intermittent pain in area lateral and inferior on the right likely secondary to nerve irritation from transfascial sutures.         Earline MayotteByrnett, Persephone Schriever W 05/31/2015, 8:48 AM

## 2015-05-31 NOTE — Progress Notes (Deleted)
Patient ID: Andrew SchwartzWilliam S Oats Jr., male   DOB: 03/15/1971, 44 y.o.   MRN: [id number]  Chief Complaint  Patient presents with  . Routine Post Op    ventral hernia     HPI Andrew SchwartzWilliam S Englert Jr. is a 44 y.o. male here today following up from his infected wound. Patient states the area is healing well , no more drainage.  HPI  Past Medical History  Diagnosis Date  . Umbilical hernia 10/17/2011  . Chicken pox   . Kidney stones     Past Surgical History  Procedure Laterality Date  . Shoulder surgery      rotator cuff  . Ankle surgery      x2  . Nasal spectrum    . Rotator cuff right    . Left and right ankle repair ligament    . Epigastric hernia repair N/A 03/30/2015    Procedure: HERNIA REPAIR EPIGASTRIC ADULT;  Surgeon: Earline MayotteJeffrey W Byrnett, MD;  Location: [facility];  Service: General;  Laterality: N/A;  . Hernia repair  2014    umbilical hernia  . Hernia repair  03/30/2015    Epigastric and umbilical hernia with retrorectus 6.4 cm Ventralex mesh    Family History  Problem Relation Age of Onset  . Heart disease Paternal Grandfather   . Heart disease Maternal Grandfather   . Diabetes Maternal Grandmother   . Hypertension Mother   . Hyperlipidemia Father     Social History Social History  Substance Use Topics  . Smoking status: Never Smoker   . Smokeless tobacco: Never Used  . Alcohol Use: 0.0 oz/week    0 Standard drinks or equivalent per week     Comment: 2x month    No Known Allergies  Current Outpatient Prescriptions  Medication Sig Dispense Refill  . naproxen sodium (ANAPROX) 220 MG tablet Take 220 mg by mouth 2 (two) times daily with a meal.    . HYDROcodone-acetaminophen (NORCO) 5-325 MG per tablet Take 1-2 tablets by mouth every 6 (six) hours as needed for moderate pain or severe pain. (Patient not taking: Reported on 05/31/2015) 30 tablet 0  . sulfamethoxazole-trimethoprim (BACTRIM DS,SEPTRA DS) 800-160 MG tablet Take 1 tablet by mouth 2 (two) times daily.  (Patient not taking: Reported on 05/31/2015) 20 tablet 0   No current facility-administered medications for this visit.    Review of Systems Review of Systems  Constitutional: Negative.   Respiratory: Negative.   Cardiovascular: Negative.     Blood pressure 140/80, pulse 76, resp. rate 12, height 5\' 8"  (1.727 m), weight 228 lb (103.42 kg).  Physical Exam Physical Exam  Constitutional: He is oriented to person, place, and time. He appears well-developed and well-nourished.  Abdominal: Soft. Normal appearance and bowel sounds are normal.  Incision looks clean and healing well.   Neurological: He is alert and oriented to person, place, and time.  Skin: Skin is warm and dry.    Data Reviewed ***  Assessment    ***    Plan    ***   Patient to return as needed.   PCP:  Merri Brunetteullo, Teresa   Joram Venson 05/31/2015, 8:39 AM

## 2015-05-31 NOTE — Patient Instructions (Signed)
Patient to return as needed. Proper lifting techniques reviewed. 

## 2017-07-13 ENCOUNTER — Ambulatory Visit: Payer: Self-pay

## 2017-07-13 ENCOUNTER — Other Ambulatory Visit: Payer: Self-pay | Admitting: Occupational Medicine

## 2017-07-13 DIAGNOSIS — S6722XA Crushing injury of left hand, initial encounter: Secondary | ICD-10-CM

## 2017-07-13 DIAGNOSIS — M25532 Pain in left wrist: Secondary | ICD-10-CM

## 2019-06-16 ENCOUNTER — Ambulatory Visit (INDEPENDENT_AMBULATORY_CARE_PROVIDER_SITE_OTHER): Payer: BC Managed Care – PPO | Admitting: Family Medicine

## 2019-06-16 ENCOUNTER — Other Ambulatory Visit: Payer: Self-pay

## 2019-06-16 ENCOUNTER — Encounter: Payer: Self-pay | Admitting: Family Medicine

## 2019-06-16 VITALS — BP 120/80 | HR 99 | Temp 97.3°F | Ht 67.25 in | Wt 209.0 lb

## 2019-06-16 DIAGNOSIS — Z0000 Encounter for general adult medical examination without abnormal findings: Secondary | ICD-10-CM | POA: Diagnosis not present

## 2019-06-16 DIAGNOSIS — R739 Hyperglycemia, unspecified: Secondary | ICD-10-CM

## 2019-06-16 DIAGNOSIS — Z125 Encounter for screening for malignant neoplasm of prostate: Secondary | ICD-10-CM

## 2019-06-16 NOTE — Progress Notes (Signed)
Subjective:    Patient ID: Andrew Loma., male    DOB: 10-Apr-1971, 48 y.o.   MRN: [id number]  HPI   Patient presents to clinic to establish with PCP and for her wellness exam.  Overall he is feeling well and currently takes no regular medications.  He has no family history of cancers, will be due for colonoscopy when he turns 28.  Usually sees eye doctor annually and dentist twice a year.  Tries to follow healthy diet.  Tries to drink 100+ ounces of water a day.  Walks a lot for his job and also enjoys walking his dog almost every day.  He is married.  Past medical, social, surgical and family history reviewed and updated accordingly in chart.  Patient Active Problem List   Diagnosis Date Noted  . Abscess of postoperative wound of abdominal wall 05/03/2015  . Recurrent umbilical hernia 62/86/3817  . Epigastric hernia 03/17/2015  . Obesity, unspecified 02/13/2013  . Skin lesion of scalp 02/13/2013  . Umbilical hernia 71/16/5790   Social History   Tobacco Use  . Smoking status: Never Smoker  . Smokeless tobacco: Never Used  Substance Use Topics  . Alcohol use: Yes    Alcohol/week: 0.0 standard drinks    Comment: 2x month   Past Surgical History:  Procedure Laterality Date  . ANKLE SURGERY     x2  . EPIGASTRIC HERNIA REPAIR N/A 03/30/2015   Procedure: HERNIA REPAIR EPIGASTRIC ADULT;  Surgeon: Robert Bellow, MD;  Location: [facility] OR[facility];  Service: General;  Laterality: N/A;  . HERNIA REPAIR  3833   umbilical hernia  . HERNIA REPAIR  03/30/2015   Epigastric and umbilical hernia with retrorectus 6.4 cm Ventralex mesh  . left and right ankle repair ligament    . nasal spectrum    . rotator cuff right    . SHOULDER SURGERY     rotator cuff   Family History  Problem Relation Age of Onset  . Heart disease Paternal Grandfather   . Heart disease Maternal Grandfather   . Diabetes Maternal Grandmother   . Hypertension Mother   . Hyperlipidemia Father     Review  of Systems  Constitutional: Negative for chills, fatigue and fever.  HENT: Negative for congestion, ear pain, sinus pain and sore throat.   Eyes: Negative.   Respiratory: Negative for cough, shortness of breath and wheezing.   Cardiovascular: Negative for chest pain, palpitations and leg swelling.  Gastrointestinal: Negative for abdominal pain, diarrhea, nausea and vomiting.  Genitourinary: Negative for dysuria, frequency and urgency.  Musculoskeletal: Negative for arthralgias and myalgias.  Skin: Negative for color change, pallor and rash.  Neurological: Negative for syncope, light-headedness and headaches.  Psychiatric/Behavioral: The patient is not nervous/anxious.       Objective:   Physical Exam Vitals signs and nursing note reviewed.  Constitutional:      General: He is not in acute distress.    Appearance: He is not ill-appearing, toxic-appearing or diaphoretic.  HENT:     Head: Normocephalic and atraumatic.  Eyes:     General: No scleral icterus.    Extraocular Movements: Extraocular movements intact.     Conjunctiva/sclera: Conjunctivae normal.     Pupils: Pupils are equal, round, and reactive to light.  Neck:     Musculoskeletal: Normal range of motion and neck supple. No neck rigidity.     Vascular: No carotid bruit.  Cardiovascular:     Rate and Rhythm: Normal rate and regular rhythm.  Heart sounds: Normal heart sounds.  Pulmonary:     Effort: Pulmonary effort is normal. No respiratory distress.     Breath sounds: Normal breath sounds.  Abdominal:     General: Bowel sounds are normal. There is no distension.     Palpations: Abdomen is soft.     Tenderness: There is no abdominal tenderness. There is no guarding.  Genitourinary:    Comments: Declines GU exam  Musculoskeletal: Normal range of motion.     Right lower leg: No edema.     Left lower leg: No edema.  Lymphadenopathy:     Cervical: No cervical adenopathy.  Skin:    General: Skin is warm and dry.   Neurological:     Mental Status: He is alert and oriented to person, place, and time. Mental status is at baseline.  Psychiatric:        Mood and Affect: Mood normal.        Behavior: Behavior normal.    Today's Vitals   06/16/19 1555  BP: 120/80  Pulse: 99  Temp: (!) 97.3 F (36.3 C)  TempSrc: Temporal  SpO2: 99%  Weight: 209 lb (94.8 kg)  Height: 5' 7.25" (1.708 m)   Body mass index is 32.49 kg/m.     Assessment & Plan:    Well adult health check - Plan: PSA, CBC, Comp Met (CMET), Lipid panel, TSH  Patient will get annual screening labs in clinic.  Reviewed healthy diet and regular physical activity, recommended vegetables, whole grains good water intake.  He sees eye doctor and dentist regularly.  Wears seatbelt in car.  Reviewed healthy sleeping habits.  Reviewed safe sun practices including wearing SPF of at least 30 when outdoors.  Overall patient is a healthy-appearing 48 year old male.    He will return to clinic annually and whenever needed.

## 2019-06-17 LAB — COMPREHENSIVE METABOLIC PANEL
ALT: 42 U/L (ref 0–53)
AST: 25 U/L (ref 0–37)
Albumin: 4.7 g/dL (ref 3.5–5.2)
Alkaline Phosphatase: 52 U/L (ref 39–117)
BUN: 15 mg/dL (ref 6–23)
CO2: 30 mEq/L (ref 19–32)
Calcium: 10.1 mg/dL (ref 8.4–10.5)
Chloride: 100 mEq/L (ref 96–112)
Creatinine, Ser: 0.88 mg/dL (ref 0.40–1.50)
GFR: 91.84 mL/min (ref >60.00)
Glucose, Bld: 304 mg/dL — ABNORMAL HIGH (ref 70–99)
Potassium: 4.4 mEq/L (ref 3.5–5.1)
Sodium: 138 mEq/L (ref 135–145)
Total Bilirubin: 1 mg/dL (ref 0.2–1.2)
Total Protein: 7.3 g/dL (ref 6.0–8.3)

## 2019-06-17 LAB — LIPID PANEL
Cholesterol: 170 mg/dL (ref 0–200)
HDL: 34.3 mg/dL — ABNORMAL LOW (ref >39.00)
NonHDL: 135.97
Total CHOL/HDL Ratio: 5
Triglycerides: 311 mg/dL — ABNORMAL HIGH (ref 0.0–149.0)
VLDL: 62.2 mg/dL — ABNORMAL HIGH (ref 0.0–40.0)

## 2019-06-17 LAB — PSA: PSA: 2.12 ng/mL (ref 0.10–4.00)

## 2019-06-17 LAB — CBC
HCT: 49.6 % (ref 39.0–52.0)
Hemoglobin: 17 g/dL (ref 13.0–17.0)
MCHC: 34.2 g/dL (ref 30.0–36.0)
MCV: 90 fl (ref 78.0–100.0)
Platelets: 249 10*3/uL (ref 150.0–400.0)
RBC: 5.51 Mil/uL (ref 4.22–5.81)
RDW: 13 % (ref 11.5–15.5)
WBC: 6.6 10*3/uL (ref 4.0–10.5)

## 2019-06-17 LAB — LDL CHOLESTEROL, DIRECT: Direct LDL: 103 mg/dL

## 2019-06-17 LAB — TSH: TSH: 2.18 u[IU]/mL (ref 0.35–4.50)

## 2019-06-17 NOTE — Addendum Note (Signed)
Addended by: [name] on: 06/17/2019 05:16 PM   Modules accepted: Orders

## 2019-06-18 ENCOUNTER — Telehealth: Payer: Self-pay | Admitting: *Deleted

## 2019-06-18 ENCOUNTER — Other Ambulatory Visit (INDEPENDENT_AMBULATORY_CARE_PROVIDER_SITE_OTHER): Payer: BC Managed Care – PPO

## 2019-06-18 DIAGNOSIS — R739 Hyperglycemia, unspecified: Secondary | ICD-10-CM

## 2019-06-18 LAB — HEMOGLOBIN A1C: Hgb A1c MFr Bld: 11 % — ABNORMAL HIGH (ref 4.6–6.5)

## 2019-06-18 NOTE — Telephone Encounter (Signed)
I have called patient in regards to lab results. He is scheduled with Lauren to discuss medication for diabetes.

## 2019-06-18 NOTE — Telephone Encounter (Signed)
Copied from [place] [id number]. Topic: General - Other >> Jun 18, 2019 10:55 AM Celene Kras A wrote: Reason for CRM: Pt called and is requesting to have a call back regarding his lab results. Please advise.

## 2019-06-20 ENCOUNTER — Encounter: Payer: Self-pay | Admitting: Family Medicine

## 2019-06-20 ENCOUNTER — Ambulatory Visit (INDEPENDENT_AMBULATORY_CARE_PROVIDER_SITE_OTHER): Payer: BC Managed Care – PPO | Admitting: Family Medicine

## 2019-06-20 VITALS — Ht 67.75 in | Wt 209.0 lb

## 2019-06-20 DIAGNOSIS — E1165 Type 2 diabetes mellitus with hyperglycemia: Secondary | ICD-10-CM | POA: Diagnosis not present

## 2019-06-20 MED ORDER — OZEMPIC (0.25 OR 0.5 MG/DOSE) 2 MG/1.5ML ~~LOC~~ SOPN: PEN_INJECTOR | SUBCUTANEOUS | 3 refills | Status: DC

## 2019-06-20 MED ORDER — BLOOD GLUCOSE METER KIT: PACK | 0 refills | Status: DC

## 2019-06-20 MED ORDER — METFORMIN HCL 1000 MG PO TABS: ORAL_TABLET | ORAL | 3 refills | Status: DC

## 2019-06-20 NOTE — Progress Notes (Signed)
Patient ID: Andrew Loma., male   DOB: 05-01-1971, 48 y.o.   MRN: [id number]    Virtual Visit via video Note  This visit type was conducted due to national recommendations for restrictions regarding the COVID-19 pandemic (e.g. social distancing).  This format is felt to be most appropriate for this patient at this time.  All issues noted in this document were discussed and addressed.  No physical exam was performed (except for noted visual exam findings with Video Visits).   I connected with Sonny Ingalls today at  3:20 PM EST by a video enabled telemedicine application or telephone and verified that I am speaking with the correct person using two identifiers. Location patient: home Location provider: work or home office Persons participating in the virtual visit: patient, provider  I discussed the limitations, risks, security and privacy concerns of performing an evaluation and management service by video and the availability of in person appointments. I also discussed with the patient that there may be a patient responsible charge related to this service. The patient expressed understanding and agreed to proceed.   HPI:  Patient and I connected via video to discuss his recent labs.  A1c showed 11% indicating that he is in fact a diabetic that most likely has been uncontrolled for quite some time.  Patient denies any symptoms of polyuria polydipsia or polyphagia.  States he does drink a lot of water, but this is a normal habit for him.  He is physically active at work, walks back and forth all day for his job.  Could eat healthier diet, states he does eat a sausage biscuit almost every day for breakfast and does not really watch what he eats in particular to carbs and sugars.  Lab Results  Component Value Date   HGBA1C 11.0 (H) 06/18/2019   BMP Latest Ref Rng & Units 06/16/2019 11/04/2014  Glucose 70 - 99 mg/dL 304(H) 132(H)  BUN 6 - 23 mg/dL 15 12  Creatinine 0.40 - 1.50 mg/dL 0.88 1.09   Sodium 135 - 145 mEq/L 138 139  Potassium 3.5 - 5.1 mEq/L 4.4 3.9  Chloride 96 - 112 mEq/L 100 101  CO2 19 - 32 mEq/L 30 30  Calcium 8.4 - 10.5 mg/dL 10.1 9.5     ROS: See pertinent positives and negatives per HPI.  Past Medical History:  Diagnosis Date  . Chicken pox   . Kidney stones   . Umbilical hernia 0/93/2671    Past Surgical History:  Procedure Laterality Date  . ANKLE SURGERY     x2  . EPIGASTRIC HERNIA REPAIR N/A 03/30/2015   Procedure: HERNIA REPAIR EPIGASTRIC ADULT;  Surgeon: Robert Bellow, MD;  Location: [facility] ORS;  Service: General;  Laterality: N/A;  . HERNIA REPAIR  2458   umbilical hernia  . HERNIA REPAIR  03/30/2015   Epigastric and umbilical hernia with retrorectus 6.4 cm Ventralex mesh  . left and right ankle repair ligament    . nasal spectrum    . rotator cuff right    . SHOULDER SURGERY     rotator cuff    Family History  Problem Relation Age of Onset  . Heart disease Paternal Grandfather   . Heart disease Maternal Grandfather   . Diabetes Maternal Grandmother   . Hypertension Mother   . Hyperlipidemia Father    Social History   Tobacco Use  . Smoking status: Never Smoker  . Smokeless tobacco: Never Used  Substance Use Topics  . Alcohol use:  Yes    Alcohol/week: 0.0 standard drinks    Comment: 2x month   No current outpatient medications on file.  EXAM:   GENERAL: alert, oriented, appears well and in no acute distress  HEENT: atraumatic, conjunttiva clear, no obvious abnormalities on inspection of external nose and ears  NECK: normal movements of the head and neck  LUNGS: on inspection no signs of respiratory distress, breathing rate appears normal, no obvious gross SOB, gasping or wheezing  CV: no obvious cyanosis  MS: moves all visible extremities without noticeable abnormality  PSYCH/NEURO: pleasant and cooperative, no obvious depression or anxiety, speech and thought processing grossly intact  ASSESSMENT AND PLAN:   Discussed the following assessment and plan:  Type 2 diabetes- patient is a newly diagnosed type II diabetic.  We will get him started on Metformin 1000 mg twice daily and try to get Ozempic once weekly.  Ozempic potentially could be too expensive, but we will see price once it processes insurance.  He will go to diabetes education for full education in regards to use of glucose meter, diabetes medicines, diet and cooking tips.  Patient weary of starting medicines at all, but advised patient that with his A1c level he really needs to be on at minimum 1 or 2 diabetes medicines.  Long discussion with patient about medications, diabetes disease process, foods and carb counting.    I discussed the assessment and treatment plan with the patient. The patient was provided an opportunity to ask questions and all were answered. The patient agreed with the plan and demonstrated an understanding of the instructions.   The patient was advised to call back or seek an in-person evaluation if the symptoms worsen or if the condition fails to improve as anticipated.  I provided 40 minutes of video-face-to-face time during this encounter.   Tracey Harries, FNP

## 2019-06-20 NOTE — Patient Instructions (Signed)
Carbohydrate Counting for Diabetes Mellitus, Adult  Carbohydrate counting is a method of keeping track of how many carbohydrates you eat. Eating carbohydrates naturally increases the amount of sugar (glucose) in the blood. Counting how many carbohydrates you eat helps keep your blood glucose within normal limits, which helps you manage your diabetes (diabetes mellitus). It is important to know how many carbohydrates you can safely have in each meal. This is different for every person. A diet and nutrition specialist (registered dietitian) can help you make a meal plan and calculate how many carbohydrates you should have at each meal and snack. Carbohydrates are found in the following foods:  Grains, such as breads and cereals.  Dried beans and soy products.  Starchy vegetables, such as potatoes, peas, and corn.  Fruit and fruit juices.  Milk and yogurt.  Sweets and snack foods, such as cake, cookies, candy, chips, and soft drinks. How do I count carbohydrates? There are two ways to count carbohydrates in food. You can use either of the methods or a combination of both. Reading "Nutrition Facts" on packaged food The "Nutrition Facts" list is included on the labels of almost all packaged foods and beverages in the [place] It includes:  The serving size.  Information about nutrients in each serving, including the grams (g) of carbohydrate per serving. To use the "Nutrition Facts":  Decide how many servings you will have.  Multiply the number of servings by the number of carbohydrates per serving.  The resulting number is the total amount of carbohydrates that you will be having. Learning standard serving sizes of other foods When you eat carbohydrate foods that are not packaged or do not include "Nutrition Facts" on the label, you need to measure the servings in order to count the amount of carbohydrates:  Measure the foods that you will eat with a food scale or measuring cup, if needed.   Decide how many standard-size servings you will eat.  Multiply the number of servings by 15. Most carbohydrate-rich foods have about 15 g of carbohydrates per serving. ? For example, if you eat 8 oz (170 g) of strawberries, you will have eaten 2 servings and 30 g of carbohydrates (2 servings x 15 g = 30 g).  For foods that have more than one food mixed, such as soups and casseroles, you must count the carbohydrates in each food that is included. The following list contains standard serving sizes of common carbohydrate-rich foods. Each of these servings has about 15 g of carbohydrates:   hamburger bun or  English muffin.   oz (15 mL) syrup.   oz (14 g) jelly.  1 slice of bread.  1 six-inch tortilla.  3 oz (85 g) cooked rice or pasta.  4 oz (113 g) cooked dried beans.  4 oz (113 g) starchy vegetable, such as peas, corn, or potatoes.  4 oz (113 g) hot cereal.  4 oz (113 g) mashed potatoes or  of a large baked potato.  4 oz (113 g) canned or frozen fruit.  4 oz (120 mL) fruit juice.  4-6 crackers.  6 chicken nuggets.  6 oz (170 g) unsweetened dry cereal.  6 oz (170 g) plain fat-free yogurt or yogurt sweetened with artificial sweeteners.  8 oz (240 mL) milk.  8 oz (170 g) fresh fruit or one small piece of fruit.  24 oz (680 g) popped popcorn. Example of carbohydrate counting Sample meal  3 oz (85 g) chicken breast.  6 oz (170 g)   brown rice.  4 oz (113 g) corn.  8 oz (240 mL) milk.  8 oz (170 g) strawberries with sugar-free whipped topping. Carbohydrate calculation 1. Identify the foods that contain carbohydrates: ? Rice. ? Corn. ? Milk. ? Strawberries. 2. Calculate how many servings you have of each food: ? 2 servings rice. ? 1 serving corn. ? 1 serving milk. ? 1 serving strawberries. 3. Multiply each number of servings by 15 g: ? 2 servings rice x 15 g = 30 g. ? 1 serving corn x 15 g = 15 g. ? 1 serving milk x 15 g = 15 g. ? 1 serving  strawberries x 15 g = 15 g. 4. Add together all of the amounts to find the total grams of carbohydrates eaten: ? 30 g + 15 g + 15 g + 15 g = 75 g of carbohydrates total. Summary  Carbohydrate counting is a method of keeping track of how many carbohydrates you eat.  Eating carbohydrates naturally increases the amount of sugar (glucose) in the blood.  Counting how many carbohydrates you eat helps keep your blood glucose within normal limits, which helps you manage your diabetes.  A diet and nutrition specialist (registered dietitian) can help you make a meal plan and calculate how many carbohydrates you should have at each meal and snack. This information is not intended to replace advice given to you by your health care provider. Make sure you discuss any questions you have with your health care provider. Document Released: 07/24/2005 Document Revised: 02/15/2017 Document Reviewed: 01/05/2016 Elsevier Patient Education  2020 [employer]  

## 2019-06-23 ENCOUNTER — Telehealth: Payer: Self-pay | Admitting: Family Medicine

## 2019-06-23 ENCOUNTER — Ambulatory Visit: Payer: Self-pay | Admitting: Pharmacist

## 2019-06-23 DIAGNOSIS — E1165 Type 2 diabetes mellitus with hyperglycemia: Secondary | ICD-10-CM

## 2019-06-23 NOTE — Chronic Care Management (AMB) (Signed)
Chronic Care Management   Follow Up Note   06/23/2019 Name: Andrew Hoobler Vega Jr. MRN: [id number] DOB: 09/19/1970  Referred by: Andrew Green, FNP Reason for referral : Chronic Care Management (Medication Management)   Andrew Holloway Andrew Holloway. is a 48 y.o. year old male who is a primary care patient of Holloway, Jacquelynn Cree, FNP. The CCM team was consulted for assistance with chronic disease management and care coordination needs.    Received message from PCP with medication access needs.   Review of patient status, including review of consultants reports, relevant laboratory and other test results, and collaboration with appropriate care team members and the patient's provider was performed as part of comprehensive patient evaluation and provision of chronic care management services.    SDOH (Social Determinants of Health) screening performed today: Financial Strain . See Care Plan for related entries.   Outpatient Encounter Medications as of 06/23/2019  Medication Sig  . blood glucose meter kit and supplies Dispense based on patient and insurance preference. Use up to four times daily as directed. (FOR ICD-10 E10.9, E11.9).  . metFORMIN (GLUCOPHAGE) 1000 MG tablet Start with 1000 mg by mouth daily for 7 days, then increase to 1000 mg by mouth 2x per day thereafter.  . [DISCONTINUED] Semaglutide,0.25 or 0.5MG/DOS, (OZEMPIC, 0.25 OR 0.5 MG/DOSE,) 2 MG/1.5ML SOPN Inject 0.25 mg into the skin once a week for 28 days, THEN 0.5 mg once a week. (Patient not taking: Reported on 06/23/2019)   No facility-administered encounter medications on file as of 06/23/2019.      Goals Addressed            This Visit's Progress     Patient Stated   . "The medication is too expensive" (pt-stated)       Current Barriers:  . Diabetes: uncontrolled but improved; most recent A1c 11.0%  o Andrew Holloway prescribed metformin and Ozempic; patient contacted clinic today noting that Andrew Holloway was >$700 with  insurance . Current antihyperglycemic regimen: metformin 1000 mg daily (instructed to increase to 1000 mg BID after 1 week) . Current blood glucose readings: Reports that he started checking over the weekend and had readings of 180-201 . Cardiovascular risk reduction: o Current hypertensive regimen: none o Current hyperlipidemia regimen: none; 10 year ASCVD risk 10.7%  Pharmacist Clinical Goal(s):  Andrew Holloway Over the next 90 days, patient with work with PharmD and primary care provider to address optimized medication management  Interventions: . Comprehensive medication review performed, medication list updated in electronic medical record . Counseled on goal A1c, goal fasting glucose, and goal post prandial glucose . Contacted BCBS. Patient has a $3000 deductible. Inquired about copay for SGLT2- was ~$500. However, [employer]'s coupon card has a max benefit of $583, so patient should be able to obtain this medication for free. However, given patient's improvement in BG with metformin 1000 mg daily and pending titration to 1000 mg BID, will hold on adding SGLT2 at this time and instead follow BG. Discussed with Andrew Nettle, NP.  Andrew Holloway Moving forward, consider moderate intensity statin for ASCVD risk reduction  Patient Self Care Activities:  . Patient will check blood glucose regularly , document, and provide at future appointment . Patient will take medications as prescribed . Patient will report any questions or concerns to provider   Initial goal documentation         Plan:  - Will outreach patient in ~4-6 weeks for continued medication management support  Andrew Holloway, PharmD, CPP Clinical Pharmacist  Andrew Holloway [phone]

## 2019-06-23 NOTE — Telephone Encounter (Signed)
Spoke with [name] about our options in regard to medications  Her thoughts:  I called [employer], he has a HUGE $3000 deductible, which is why the Ozempic is so expensive. Sounds like most of the drug cost of any medication is going to go towards that until it is met, so anything brand name is going to be expensive. SGLT2s are around $500... however, [employer]'s copay cards takes off up $583, so, he should be able to get the medication for free. However, he said he was checking sugars this weekend and the highest reading was 201 (lowest 180). I also think it would be appropriate to follow on metformin monotherapy + the diet/lifetstyle changes he's made, and then evaluate if additional pharmacotherapy is needed.    WE will do just monotherapy with metformin for now. Steglatro option is there for [place] as well.   He is newly diagnosed so we are in a gray area of figuring out medications and seeing how his body responds. He has never had blood sugar issues diagnosed before.  Can we make him a follow up on 6-8 weeks after being just on the metformin for re-check?  Thanks! LG

## 2019-06-23 NOTE — Telephone Encounter (Signed)
Pt can not afford ozempic pen after insurance copay 700.00. pt would like something more afford and pt would like a callback from [facility] also. [address] in [place]

## 2019-06-23 NOTE — Telephone Encounter (Signed)
See CCM note 

## 2019-06-23 NOTE — Telephone Encounter (Signed)
[name] -- is there any way we can get this patient ozmepic?  If no, then I was thinking rybelsus next to try or jardiance as my final idea  He really wants to avoid daily injections; I am hoping ozempic can be possible  Thanks! LG

## 2019-06-23 NOTE — Patient Instructions (Signed)
Visit Information  Goals Addressed            This Visit's Progress     Patient Stated   . "The medication is too expensive" (pt-stated)       Current Barriers:  . Diabetes: uncontrolled but improved; most recent A1c 11.0%  o [name] prescribed metformin and Ozempic; patient contacted clinic today noting that [place] was >$700 with insurance . Current antihyperglycemic regimen: metformin 1000 mg daily (instructed to increase to 1000 mg BID after 1 week) . Current blood glucose readings: Reports that he started checking over the weekend and had readings of 180-201 . Cardiovascular risk reduction: o Current hypertensive regimen: none o Current hyperlipidemia regimen: none; 10 year ASCVD risk 10.7%  Pharmacist Clinical Goal(s):  [name] Over the next 90 days, patient with work with PharmD and primary care provider to address optimized medication management  Interventions: . Comprehensive medication review performed, medication list updated in electronic medical record . Counseled on goal A1c, goal fasting glucose, and goal post prandial glucose . Contacted [employer]. Patient has a $3000 deductible. Inquired about copay for SGLT2- was ~$500. However, [employer] coupon card has a max benefit of $583, so patient should be able to obtain this medication for free. However, given patient's improvement in BG with metformin 1000 mg daily and pending titration to 1000 mg BID, will hold on adding SGLT2 at this time and instead follow BG. Discussed with [name], NP.  [name] Moving forward, consider moderate intensity statin for ASCVD risk reduction  Patient Self Care Activities:  . Patient will check blood glucose regularly , document, and provide at future appointment . Patient will take medications as prescribed . Patient will report any questions or concerns to provider   Initial goal documentation        The patient verbalized understanding of instructions provided today and declined a  print copy of patient instruction materials.   Plan:  - Will outreach patient in ~4-6 weeks for continued medication management support  [name], PharmD, [place] Pharmacist [place] [phone]

## 2019-06-23 NOTE — Telephone Encounter (Signed)
I spoke to patient & advised on below. He spoke with [name] & stated that he would be speaking with her again as well. He wil ltake metformin & have f/u with [name], FNP in 6 weeks.

## 2019-07-01 ENCOUNTER — Ambulatory Visit: Payer: BC Managed Care – PPO | Admitting: Dietician

## 2019-07-28 ENCOUNTER — Ambulatory Visit: Payer: BC Managed Care – PPO | Admitting: Pharmacist

## 2019-07-28 ENCOUNTER — Telehealth: Payer: Self-pay | Admitting: Family

## 2019-07-28 DIAGNOSIS — E1165 Type 2 diabetes mellitus with hyperglycemia: Secondary | ICD-10-CM

## 2019-07-28 NOTE — Patient Instructions (Signed)
Visit Information  Goals Addressed            This Visit's Progress     Patient Stated   . "The medication is too expensive" (pt-stated)       Current Barriers:  . Diabetes: uncontrolled but improved; most recent A1c 11.0%  o Notes that he is very proud and pleased with his improvement in readings.  o Weight now down to 195 lbs . Current antihyperglycemic regimen: metformin 1000 mg BID . Current blood glucose readings:  o Fastings: 112-125  o Afternoons: 140s, 185, one day of 210 after having 5 pieces of pizza . Current meals:  o Breakfast: triple zero yogurt + Atkins bar o Lunch: mixed greens salad + 2 packs of tuna, tomatoes, olives, feta cheese; caesar dressing or homemade [place]; sometimes another Atkins snack back o Supper: mostly just snacking o Snack/Dessert: dark chocolate squares . Current exercise:  o Notes his job has been more active recently  . Cardiovascular risk reduction: o Current hypertensive regimen: none o Current hyperlipidemia regimen: none; 10 year ASCVD risk 10.7%  Pharmacist Clinical Goal(s):  [name] Over the next 90 days, patient with work with PharmD and primary care provider to address optimized medication management  Interventions: . Comprehensive medication review performed, medication list updated in electronic medical record . Reiterated goal A1c, goal fasting glucose, and goal post prandial glucose . F/u with PCP later this week. A1c will be due in February. Recommend continuing metformin monotherapy; as noted in previous note, could consider addition of Steglatro in the future if additional pharmacotherapy needed.  . Consider addressing statin therapy at upcoming appointment.  Patient Self Care Activities:  . Patient will check blood glucose regularly , document, and provide at future appointment . Patient will take medications as prescribed . Patient will report any questions or concerns to provider   Please see past updates related to this  goal by clicking on the "Past Updates" button in the selected goal         The patient verbalized understanding of instructions provided today and declined a print copy of patient instruction materials.   Plan: - Scheduled follow up call on 09/15/2019 at 10 am for continued medication management support  [name], PharmD, [place], [place] Pharmacist [place] [phone]

## 2019-07-28 NOTE — Chronic Care Management (AMB) (Signed)
Chronic Care Management   Follow Up Note   07/28/2019 Name: Andrew Spieker Branton Jr. MRN: [id number] DOB: January 06, 1971  Referred by: Burnard Hawthorne, FNP Reason for referral : Chronic Care Management   Andrew Holloway Andrew Holloway. is a 48 y.o. year old male who is a primary care patient of Burnard Hawthorne, FNP. The CCM team was consulted for assistance with chronic disease management and care coordination needs.    Contacted patient for medication management review today.   Review of patient status, including review of consultants reports, relevant laboratory and other test results, and collaboration with appropriate care team members and the patient's provider was performed as part of comprehensive patient evaluation and provision of chronic care management services.    SDOH (Social Determinants of Health) screening performed today: Physical Activity. See Care Plan for related entries.   Outpatient Encounter Medications as of 07/28/2019  Medication Sig  . blood glucose meter kit and supplies Dispense based on patient and insurance preference. Use up to four times daily as directed. (FOR ICD-10 E10.9, E11.9).  . metFORMIN (GLUCOPHAGE) 1000 MG tablet Start with 1000 mg by mouth daily for 7 days, then increase to 1000 mg by mouth 2x per day thereafter.   No facility-administered encounter medications on file as of 07/28/2019.     Goals Addressed            This Visit's Progress     Patient Stated   . "The medication is too expensive" (pt-stated)       Current Barriers:  . Diabetes: uncontrolled but improved; most recent A1c 11.0%  o Notes that he is very proud and pleased with his improvement in readings.  o Weight now down to 195 lbs . Current antihyperglycemic regimen: metformin 1000 mg BID . Current blood glucose readings:  o Fastings: 112-125  o Afternoons: 140s, 185, one day of 210 after having 5 pieces of pizza . Current meals:  o Breakfast: triple zero yogurt + Atkins  bar o Lunch: mixed greens salad + 2 packs of tuna, tomatoes, olives, feta cheese; caesar dressing or homemade [place]; sometimes another Atkins snack back o Supper: mostly just snacking o Snack/Dessert: dark chocolate squares . Current exercise:  o Notes his job has been more active recently  . Cardiovascular risk reduction: o Current hypertensive regimen: none o Current hyperlipidemia regimen: none; 10 year ASCVD risk 10.7%  Pharmacist Clinical Goal(s):  [name] Over the next 90 days, patient with work with PharmD and primary care provider to address optimized medication management  Interventions: . Comprehensive medication review performed, medication list updated in electronic medical record . Reiterated goal A1c, goal fasting glucose, and goal post prandial glucose . F/u with PCP later this week. A1c will be due in February. Recommend continuing metformin monotherapy; as noted in previous note, could consider addition of Steglatro in the future if additional pharmacotherapy needed.  . Consider addressing statin therapy at upcoming appointment.  Patient Self Care Activities:  . Patient will check blood glucose regularly , document, and provide at future appointment . Patient will take medications as prescribed . Patient will report any questions or concerns to provider   Please see past updates related to this goal by clicking on the "Past Updates" button in the selected goal          Plan: - Scheduled follow up call on 09/15/2019 at 10 am for continued medication management support  [name], PharmD, [place], CPP Clinical Pharmacist [place]  [facility] [phone]

## 2019-07-28 NOTE — Telephone Encounter (Signed)
Call pt Please make an est care appt in January ( early if can)

## 2019-07-28 NOTE — Progress Notes (Signed)
Agree with plan. Pending establish care appointment so we can further review DM.  [name], NP

## 2019-08-06 ENCOUNTER — Ambulatory Visit (INDEPENDENT_AMBULATORY_CARE_PROVIDER_SITE_OTHER): Payer: BC Managed Care – PPO | Admitting: Family

## 2019-08-06 ENCOUNTER — Encounter: Payer: Self-pay | Admitting: Family

## 2019-08-06 VITALS — Ht 67.75 in | Wt 195.0 lb

## 2019-08-06 DIAGNOSIS — E119 Type 2 diabetes mellitus without complications: Secondary | ICD-10-CM | POA: Insufficient documentation

## 2019-08-06 DIAGNOSIS — E785 Hyperlipidemia, unspecified: Secondary | ICD-10-CM | POA: Insufficient documentation

## 2019-08-06 NOTE — Assessment & Plan Note (Signed)
Based on recent LDL, advised patient to consider cholesterol medication such as Crestor or Lipitor to reduce overall cardiovascular risk.  He declines at this time as he is currently losing weight and blood sugars are coming down.  He like to see what this looks like in a couple months

## 2019-08-06 NOTE — Assessment & Plan Note (Signed)
Improved, continue current regimen

## 2019-08-06 NOTE — Progress Notes (Signed)
Virtual Visit via Video Note  I connected with@  on 08/06/19 at  8:00 AM EST by a video enabled telemedicine application and verified that I am speaking with the correct person using two identifiers.  Location patient: home Location provider:work  Persons participating in the virtual visit: patient, provider  I discussed the limitations of evaluation and management by telemedicine and the availability of in person appointments. The patient expressed understanding and agreed to proceed.   HPI: Feeling 'great.'  Transfer of care. Has lost weight intentionally as well, more active.   DM- Doing well on metformin. When eats well, blood sugars are well controlled. Eating fish and limiting carbohydrates.  FBG 120s    ROS: See pertinent positives and negatives per HPI.  Past Medical History:  Diagnosis Date  . Chicken pox   . Kidney stones   . Umbilical hernia 7/67/2094    Past Surgical History:  Procedure Laterality Date  . ANKLE SURGERY     x2  . EPIGASTRIC HERNIA REPAIR N/A 03/30/2015   Procedure: HERNIA REPAIR EPIGASTRIC ADULT;  Surgeon: Robert Bellow, MD;  Location: [facility] ORS;  Service: General;  Laterality: N/A;  . HERNIA REPAIR  7096   umbilical hernia  . HERNIA REPAIR  03/30/2015   Epigastric and umbilical hernia with retrorectus 6.4 cm Ventralex mesh  . left and right ankle repair ligament    . nasal spectrum    . rotator cuff right    . SHOULDER SURGERY     rotator cuff    Family History  Problem Relation Age of Onset  . Heart disease Paternal Grandfather   . Heart disease Maternal Grandfather   . Diabetes Maternal Grandmother   . Hypertension Mother   . Hyperlipidemia Father     SOCIAL HX: never smoker   Current Outpatient Medications:  .  blood glucose meter kit and supplies, Dispense based on patient and insurance preference. Use up to four times daily as directed. (FOR ICD-10 E10.9, E11.9)., Disp: 1 each, Rfl: 0 .  metFORMIN (GLUCOPHAGE) 1000 MG  tablet, Start with 1000 mg by mouth daily for 7 days, then increase to 1000 mg by mouth 2x per day thereafter., Disp: 180 tablet, Rfl: 3  EXAM:  VITALS per patient if applicable:  GENERAL: alert, oriented, appears well and in no acute distress  HEENT: atraumatic, conjunttiva clear, no obvious abnormalities on inspection of external nose and ears  NECK: normal movements of the head and neck  LUNGS: on inspection no signs of respiratory distress, breathing rate appears normal, no obvious gross SOB, gasping or wheezing  CV: no obvious cyanosis  MS: moves all visible extremities without noticeable abnormality  PSYCH/NEURO: pleasant and cooperative, no obvious depression or anxiety, speech and thought processing grossly intact  ASSESSMENT AND PLAN:  Discussed the following assessment and plan:  Diabetes mellitus without complication ([place]) - Plan: Referral to Nutrition and Diabetes Services, Hemoglobin A1c, Lipid panel-future, Microalbumin / creatinine urine ratio  Hyperlipidemia, unspecified hyperlipidemia type Problem List Items Addressed This Visit      Endocrine   Diabetes mellitus without complication ([place]) - Primary    Improved, continue current regimen      Relevant Orders   Referral to Nutrition and Diabetes Services   Hemoglobin A1c   Lipid panel-future   Microalbumin / creatinine urine ratio     Other   HLD (hyperlipidemia)    Based on recent LDL, advised patient to consider cholesterol medication such as Crestor or Lipitor to reduce overall  cardiovascular risk.  He declines at this time as he is currently losing weight and blood sugars are coming down.  He like to see what this looks like in a couple months         -we discussed possible serious and likely etiologies, options for evaluation and workup, limitations of telemedicine visit vs in person visit, treatment, treatment risks and precautions. Pt prefers to treat via telemedicine empirically rather then  risking or undertaking an in person visit at this moment. Patient agrees to seek prompt in person care if worsening, new symptoms arise, or if is not improving with treatment.   I discussed the assessment and treatment plan with the patient. The patient was provided an opportunity to ask questions and all were answered. The patient agreed with the plan and demonstrated an understanding of the instructions.   The patient was advised to call back or seek an in-person evaluation if the symptoms worsen or if the condition fails to improve as anticipated.   Margaret Arnett, FNP   

## 2019-09-15 ENCOUNTER — Ambulatory Visit: Payer: BC Managed Care – PPO | Admitting: Pharmacist

## 2019-09-15 DIAGNOSIS — E785 Hyperlipidemia, unspecified: Secondary | ICD-10-CM

## 2019-09-15 DIAGNOSIS — E119 Type 2 diabetes mellitus without complications: Secondary | ICD-10-CM

## 2019-09-15 NOTE — Progress Notes (Signed)
Agree with plan. Keishia Ground, NP  

## 2019-09-15 NOTE — Chronic Care Management (AMB) (Signed)
  Chronic Care Management   Follow Up Note   09/15/2019 Name: Andrew Dollens Carpenter Jr. MRN: [id number] DOB: [birth year]  Referred by: Burnard Hawthorne, FNP Reason for referral : Chronic Care Management (Medication Management)   Andrew Holloway. is a 49 y.o. year old male who is a primary care patient of Burnard Hawthorne, FNP. The CCM team was consulted for assistance with chronic disease management and care coordination needs.    Contacted patient for medication management review.   Review of patient status, including review of consultants reports, relevant laboratory and other test results, and collaboration with appropriate care team members and the patient's provider was performed as part of comprehensive patient evaluation and provision of chronic care management services.    SDOH (Social Determinants of Health) screening performed today: Physical Activity. See Care Plan for related entries.   Outpatient Encounter Medications as of 09/15/2019  Medication Sig  . blood glucose meter kit and supplies Dispense based on patient and insurance preference. Use up to four times daily as directed. (FOR ICD-10 E10.9, E11.9).  . metFORMIN (GLUCOPHAGE) 1000 MG tablet Start with 1000 mg by mouth daily for 7 days, then increase to 1000 mg by mouth 2x per day thereafter.   No facility-administered encounter medications on file as of 09/15/2019.     Objective:   Goals Addressed            This Visit's Progress     Patient Stated   . "The medication is too expensive" (pt-stated)       Current Barriers:  . Diabetes: uncontrolled but improved per SMBG results; most recent A1c 11.0%  . Current antihyperglycemic regimen: metformin 1000 mg BID . Current blood glucose readings:  o Fastings: 150s right upon waking up. However, reports readings remain between 100-120s during the day, even after lunch o After supper/bedtime: smaller meal at night, generally salads, so BG remaining <150 . Current  exercise:  o Notes he has been walking more, though could be doing more formal exercise.  . Cardiovascular risk reduction: o Current hypertensive regimen: none o Current hyperlipidemia regimen: none; 10 year ASCVD risk 10.7%; declined statin at last PCP visit, will be rechecking lipids at upcoming lab appointment  Pharmacist Clinical Goal(s):  [name] Over the next 90 days, patient with work with PharmD and primary care provider to address optimized medication management  Interventions: . Comprehensive medication review performed, medication list updated in electronic medical record . Reviewed glucose readings. Reviewed Dawn Phenomenon as possible cause of higher prandial readings. . Patient asked if he should go ahead and refill metformin. Encouraged continued adherence, and to request refill from his pharmacy.  . Discussed statin recommendation, side effects, benefits. Discussed focus on LDL for CV risk reduction. Patient will consider statin therapy, pending results of lipid panel next month.   Patient Self Care Activities:  . Patient will check blood glucose regularly , document, and provide at future appointment . Patient will take medications as prescribed . Patient will report any questions or concerns to provider   Please see past updates related to this goal by clicking on the "Past Updates" button in the selected goal          Plan:  - Scheduled f/u call 11/10/19 (~4 weeks s/p upcoming PCP visit)   [name], PharmD, [place], [place] Pharmacist [place] [phone]

## 2019-09-15 NOTE — Patient Instructions (Signed)
Visit Information  Goals Addressed            This Visit's Progress     Patient Stated   . "The medication is too expensive" (pt-stated)       Current Barriers:  . Diabetes: uncontrolled but improved per SMBG results; most recent A1c 11.0%  . Current antihyperglycemic regimen: metformin 1000 mg BID . Current blood glucose readings:  o Fastings: 150s right upon waking up. However, reports readings remain between 100-120s during the day, even after lunch o After supper/bedtime: smaller meal at night, generally salads, so BG remaining <150 . Current exercise:  o Notes he has been walking more, though could be doing more formal exercise.  . Cardiovascular risk reduction: o Current hypertensive regimen: none o Current hyperlipidemia regimen: none; 10 year ASCVD risk 10.7%; declined statin at last PCP visit, will be rechecking lipids at upcoming lab appointment  Pharmacist Clinical Goal(s):  [name] Over the next 90 days, patient with work with PharmD and primary care provider to address optimized medication management  Interventions: . Comprehensive medication review performed, medication list updated in electronic medical record . Reviewed glucose readings. Reviewed Dawn Phenomenon as possible cause of higher prandial readings. . Patient asked if he should go ahead and refill metformin. Encouraged continued adherence, and to request refill from his pharmacy.  . Discussed statin recommendation, side effects, benefits. Discussed focus on LDL for CV risk reduction. Patient will consider statin therapy, pending results of lipid panel next month.   Patient Self Care Activities:  . Patient will check blood glucose regularly , document, and provide at future appointment . Patient will take medications as prescribed . Patient will report any questions or concerns to provider   Please see past updates related to this goal by clicking on the "Past Updates" button in the selected goal          The patient verbalized understanding of instructions provided today and declined a print copy of patient instruction materials.    Plan:  - Scheduled f/u call 11/10/19 (~4 weeks s/p upcoming PCP visit)   [name] Feliz Beam, PharmD, [place], CPP Clinical Pharmacist [facility] [employer] [phone]

## 2019-09-16 ENCOUNTER — Ambulatory Visit: Payer: Self-pay | Admitting: Pharmacist

## 2019-09-16 NOTE — Chronic Care Management (AMB) (Signed)
  Chronic Care Management   Note  09/16/2019 Name: Andrew Foushee Scotti Jr. MRN: [id number] DOB: March 10, 1971  Andrew Holloway. is a 49 y.o. year old male who is a primary care patient of Allegra Grana, FNP. The CCM team was consulted for assistance with chronic disease management and care coordination needs.    Patient called with a question on about artificially sweetened beverages vs flavor additives. Discussed that either option is fine, versus full sugar beverages, but water flavoring additives may be preferred.   [name] Feliz Beam, PharmD, [place], CPP Clinical Pharmacist [facility] [employer] [phone]

## 2019-09-17 ENCOUNTER — Other Ambulatory Visit: Payer: Self-pay

## 2019-09-17 ENCOUNTER — Other Ambulatory Visit: Payer: Self-pay | Admitting: Pharmacist

## 2019-09-17 DIAGNOSIS — E119 Type 2 diabetes mellitus without complications: Secondary | ICD-10-CM

## 2019-09-17 NOTE — Telephone Encounter (Signed)
Patient will call me back tomorrow to let me know brand glucometer so that I can send test strips.

## 2019-09-17 NOTE — Telephone Encounter (Signed)
Received voicemail from patient that he needs a prescription for test strips sent in.   Sending to CMA for assistane

## 2019-09-18 MED ORDER — ACCU-CHEK GUIDE VI STRP: ORAL_STRIP | 3 refills | Status: DC

## 2019-09-18 NOTE — Telephone Encounter (Signed)
Patient has Accu Chek Guide. Pended orders for CMA.

## 2019-09-18 NOTE — Telephone Encounter (Signed)
I have sent in accu chek guide per patient request.

## 2019-09-22 ENCOUNTER — Telehealth: Payer: Self-pay | Admitting: Family

## 2019-09-22 NOTE — Telephone Encounter (Signed)
Patients wife called back. Pharmacy has filled this. Please do not resubmit.

## 2019-09-22 NOTE — Telephone Encounter (Signed)
Pt's wife called and states that pt needs a refill on AccuCheck test strips.

## 2019-09-22 NOTE — Telephone Encounter (Signed)
Noted  

## 2019-10-06 ENCOUNTER — Other Ambulatory Visit: Payer: Self-pay | Admitting: Family

## 2019-10-06 ENCOUNTER — Other Ambulatory Visit (INDEPENDENT_AMBULATORY_CARE_PROVIDER_SITE_OTHER): Payer: BC Managed Care – PPO

## 2019-10-06 ENCOUNTER — Other Ambulatory Visit: Payer: Self-pay

## 2019-10-06 DIAGNOSIS — E119 Type 2 diabetes mellitus without complications: Secondary | ICD-10-CM

## 2019-10-06 DIAGNOSIS — E785 Hyperlipidemia, unspecified: Secondary | ICD-10-CM

## 2019-10-06 LAB — LIPID PANEL
Cholesterol: 150 mg/dL (ref 0–200)
HDL: 33.2 mg/dL — ABNORMAL LOW (ref >39.00)
LDL Cholesterol: 96 mg/dL (ref 0–99)
NonHDL: 116.97
Total CHOL/HDL Ratio: 5
Triglycerides: 107 mg/dL (ref 0.0–149.0)
VLDL: 21.4 mg/dL (ref 0.0–40.0)

## 2019-10-06 LAB — MICROALBUMIN / CREATININE URINE RATIO
Creatinine,U: 125.3 mg/dL
Microalb Creat Ratio: 1 mg/g (ref 0.0–30.0)
Microalb, Ur: 1.3 mg/dL (ref 0.0–1.9)

## 2019-10-06 LAB — HEMOGLOBIN A1C: Hgb A1c MFr Bld: 7.3 % — ABNORMAL HIGH (ref 4.6–6.5)
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
## 2019-10-08 ENCOUNTER — Encounter: Payer: Self-pay | Admitting: Family

## 2019-10-08 ENCOUNTER — Ambulatory Visit (INDEPENDENT_AMBULATORY_CARE_PROVIDER_SITE_OTHER): Payer: BC Managed Care – PPO | Admitting: Family

## 2019-10-08 ENCOUNTER — Other Ambulatory Visit: Payer: Self-pay

## 2019-10-08 DIAGNOSIS — M545 Low back pain, unspecified: Secondary | ICD-10-CM | POA: Insufficient documentation

## 2019-10-08 DIAGNOSIS — E119 Type 2 diabetes mellitus without complications: Secondary | ICD-10-CM

## 2019-10-08 DIAGNOSIS — E785 Hyperlipidemia, unspecified: Secondary | ICD-10-CM | POA: Diagnosis not present

## 2019-10-08 MED ORDER — MELOXICAM 7.5 MG PO TABS: 7.5000 mg | ORAL_TABLET | Freq: Every day | ORAL | 1 refills | Status: DC | PRN

## 2019-10-08 MED ORDER — CYCLOBENZAPRINE HCL 5 MG PO TABS: 5.0000 mg | ORAL_TABLET | Freq: Every evening | ORAL | 0 refills | Status: DC | PRN

## 2019-10-08 NOTE — Assessment & Plan Note (Signed)
Acute, suspect arthritic, possible muscle spasm.  Will treat conservatively at this time.  Patient politely declines x-ray at this time.  We will start with meloxicam and Flexeril as needed.  Patient will let me know how he is doing

## 2019-10-08 NOTE — Patient Instructions (Signed)
Lets start with conservative therapy including Flexeril as needed at bedtime. As needed meloxicam.  Stop Aleve.   Heat, gentle stretching and TENS unit as discussed. Please let me know if not better. Follow-up in 3 months

## 2019-10-08 NOTE — Assessment & Plan Note (Addendum)
Lab Results  Component Value Date   HGBA1C 7.3 (H) 10/06/2019   Much improved.  Congratulated patient on weight loss. We will continue current regimen of metformin.  Of note, blood pressure is incredibly well controlled and no protein seen in his urine.  We jointly agreed to not start an ACE or an ARB at this time.

## 2019-10-08 NOTE — Assessment & Plan Note (Signed)
Uncontrolled, LDL greater than 70.  Discussed his overall cardiovascular risk.  Patient would like to try lifestyle modification for the next couple months ; we will monitor for cholesterol closely.

## 2019-11-10 ENCOUNTER — Ambulatory Visit: Payer: BC Managed Care – PPO | Admitting: Pharmacist

## 2019-11-10 DIAGNOSIS — E119 Type 2 diabetes mellitus without complications: Secondary | ICD-10-CM

## 2019-11-10 NOTE — Patient Instructions (Signed)
Visit Information  Goals Addressed            This Visit's Progress     Patient Stated   . "The medication is too expensive" (pt-stated)       CARE PLAN ENTRY (see longtitudinal plan of care for additional care plan information)  Current Barriers:  . Diabetes: uncontrolled but improved per SMBG results; most recent A1c 7.3% o VERY pleased with improvement in A1c . Current antihyperglycemic regimen: metformin 1000 mg BID . Current blood glucose readings:  o Reports a few days of higher readings to 150s, notes that he was eating fast food during that period of time  o Fasting: isn't checking fastings as often lately o 2-3 hours after lunch: 110-140s . Current meal patterns: o Breakfast: 4-6 boiled eggs; 2-3 pieces of jerky; every once in a while a protein bar o Lunch: large salad, mixed greens, vegetables; ranch or french dressing w/ package of tuna or grilled  o Supper: sometimes skips, but sometimes protein bar (Atkins bars, 13 g protein, 5 g CHO) o Snacks: protein bars; peanuts; Atkins peanut butter cup snacks; sunflower seeds; o Drinks: water, occasional diet drink  . Current exercise:  o A little bit of walking, playing more golf lately  . Cardiovascular risk reduction: o Current hypertensive regimen: none o Current hyperlipidemia regimen: none; 10 year ASCVD risk 5.2%; declined statin at last PCP visit  Pharmacist Clinical Goal(s):  [name] Over the next 90 days, patient with work with PharmD and primary care provider to address optimized medication management  Interventions: . Comprehensive medication review performed, medication list updated in electronic medical record . Congratulated on significant improvement in A1c.  . Reviewed goal A1c, goal fasting glucose, and goal 2 hour post prandial glucose.  [name] Extensive dietary discussion. Discussed being mindful of high-fat options. Discussed encouraged increased in exercise.  . Discussed benefit of statin therapy. Would  recommend moderate intensity statin conversation, however, ASCVD risk is low at this time ~5%. Continue to monitor.   Patient Self Care Activities:  . Patient will check blood glucose regularly , document, and provide at future appointment . Patient will take medications as prescribed . Patient will report any questions or concerns to provider   Please see past updates related to this goal by clicking on the "Past Updates" button in the selected goal         Patient verbalizes understanding of instructions provided today.    Plan:  - Scheduled f/u call 02/12/20  [name] Feliz Beam, PharmD, BCACP, CPP Clinical Pharmacist [facility] [employer] [phone]

## 2019-11-10 NOTE — Chronic Care Management (AMB) (Signed)
Chronic Care Management   Follow Up Note   11/10/2019 Name: Gabriela Giannelli Wan Jr. MRN: [id number] DOB: [birth year]  Referred by: Burnard Hawthorne, FNP Reason for referral : Chronic Care Management (Medication Management)   Grace Bushy Bookwalter Brooke Bonito. is a 49 y.o. year old male who is a primary care patient of Burnard Hawthorne, FNP. The CCM team was consulted for assistance with chronic disease management and care coordination needs.    Contacted patient for medication management review.   Review of patient status, including review of consultants reports, relevant laboratory and other test results, and collaboration with appropriate care team members and the patient's provider was performed as part of comprehensive patient evaluation and provision of chronic care management services.    SDOH (Social Determinants of Health) assessments performed: Yes See Care Plan activities for detailed interventions related to [facility])     Outpatient Encounter Medications as of 11/10/2019  Medication Sig  . CINNAMON PO Take 1 tablet by mouth daily.  . metFORMIN (GLUCOPHAGE) 1000 MG tablet Start with 1000 mg by mouth daily for 7 days, then increase to 1000 mg by mouth 2x per day thereafter.  . blood glucose meter kit and supplies Dispense based on patient and insurance preference. Use up to four times daily as directed. (FOR ICD-10 E10.9, E11.9).  . cyclobenzaprine (FLEXERIL) 5 MG tablet Take 1 tablet (5 mg total) by mouth at bedtime as needed for muscle spasms.  [name] glucose blood (ACCU-CHEK GUIDE) test strip Use to check blood sugar twice daily  . meloxicam (MOBIC) 7.5 MG tablet Take 1 tablet (7.5 mg total) by mouth daily as needed for pain.   No facility-administered encounter medications on file as of 11/10/2019.     Objective:   Goals Addressed            This Visit's Progress     Patient Stated   . "The medication is too expensive" (pt-stated)       CARE PLAN ENTRY (see longtitudinal plan of care for  additional care plan information)  Current Barriers:  . Diabetes: uncontrolled but improved per SMBG results; most recent A1c 7.3% o VERY pleased with improvement in A1c . Current antihyperglycemic regimen: metformin 1000 mg BID . Current blood glucose readings:  o Reports a few days of higher readings to 150s, notes that he was eating fast food during that period of time  o Fasting: isn't checking fastings as often lately o 2-3 hours after lunch: 110-140s . Current meal patterns: o Breakfast: 4-6 boiled eggs; 2-3 pieces of jerky; every once in a while a protein bar o Lunch: large salad, mixed greens, vegetables; ranch or french dressing w/ package of tuna or grilled  o Supper: sometimes skips, but sometimes protein bar (Atkins bars, 13 g protein, 5 g CHO) o Snacks: protein bars; peanuts; Atkins peanut butter cup snacks; sunflower seeds; o Drinks: water, occasional diet drink  . Current exercise:  o A little bit of walking, playing more golf lately  . Cardiovascular risk reduction: o Current hypertensive regimen: none o Current hyperlipidemia regimen: none; 10 year ASCVD risk 5.2%; declined statin at last PCP visit  Pharmacist Clinical Goal(s):  [name] Over the next 90 days, patient with work with PharmD and primary care provider to address optimized medication management  Interventions: . Comprehensive medication review performed, medication list updated in electronic medical record . Congratulated on significant improvement in A1c.  . Reviewed goal A1c, goal fasting glucose, and goal 2 hour post prandial  glucose.  [name] Extensive dietary discussion. Discussed being mindful of high-fat options. Discussed encouraged increased in exercise.  . Discussed benefit of statin therapy. Would recommend moderate intensity statin conversation, however, ASCVD risk is low at this time ~5%. Continue to monitor.   Patient Self Care Activities:  . Patient will check blood glucose regularly , document, and  provide at future appointment . Patient will take medications as prescribed . Patient will report any questions or concerns to provider   Please see past updates related to this goal by clicking on the "Past Updates" button in the selected goal          Plan:  - Scheduled f/u call 02/12/20  [name], PharmD, BCACP, [place] Pharmacist [facility] [phone]

## 2019-11-11 NOTE — Progress Notes (Signed)
Agree with plan. Tymesha Ditmore, NP  

## 2019-12-02 ENCOUNTER — Other Ambulatory Visit: Payer: Self-pay | Admitting: Family

## 2019-12-02 DIAGNOSIS — M545 Low back pain, unspecified: Secondary | ICD-10-CM

## 2020-01-08 ENCOUNTER — Telehealth: Payer: Self-pay | Admitting: Family

## 2020-01-08 ENCOUNTER — Telehealth: Payer: BC Managed Care – PPO

## 2020-01-08 DIAGNOSIS — E119 Type 2 diabetes mellitus without complications: Secondary | ICD-10-CM

## 2020-01-08 NOTE — Telephone Encounter (Signed)
Patient has an appointment on 02/05/20 with Arnett and would like to have labs done before appointment to have his a1c checked. Does patient need labs before seeing Arnett?

## 2020-01-09 ENCOUNTER — Other Ambulatory Visit: Payer: Self-pay

## 2020-01-09 ENCOUNTER — Ambulatory Visit: Payer: BC Managed Care – PPO | Admitting: Family

## 2020-01-09 DIAGNOSIS — E119 Type 2 diabetes mellitus without complications: Secondary | ICD-10-CM

## 2020-01-09 NOTE — Telephone Encounter (Signed)
I order patient a1c & he already had CMP ordered. Anything else needed? I have him scheduled one week before appointment to have labs done.

## 2020-01-13 ENCOUNTER — Ambulatory Visit: Payer: BC Managed Care – PPO | Admitting: Family

## 2020-02-10 DIAGNOSIS — H168 Other keratitis: Secondary | ICD-10-CM | POA: Diagnosis not present

## 2020-02-12 ENCOUNTER — Ambulatory Visit: Payer: BC Managed Care – PPO | Admitting: Pharmacist

## 2020-02-12 DIAGNOSIS — H168 Other keratitis: Secondary | ICD-10-CM | POA: Diagnosis not present

## 2020-02-12 DIAGNOSIS — E119 Type 2 diabetes mellitus without complications: Secondary | ICD-10-CM

## 2020-02-12 DIAGNOSIS — E785 Hyperlipidemia, unspecified: Secondary | ICD-10-CM

## 2020-02-12 NOTE — Patient Instructions (Signed)
Visit Information  Goals Addressed              This Visit's Progress     Patient Stated   .  "The medication is too expensive" (pt-stated)        CARE PLAN ENTRY (see longtitudinal plan of care for additional care plan information)  Current Barriers:  . Diabetes: uncontrolled but improved per SMBG results; most recent A1c 7.3% o Reports that for a bit, weight was ~180s. Has trended back up to 193 lbs this week . Current antihyperglycemic regimen: metformin 1000 mg BID . Current blood glucose readings:  o Fasting: 90s-130s o 2-3 hours after lunch: higher readings if higher portions; salad + protein (tuna), readings ~160; if a larger salad then readings ~200s  . Current meal patterns:  o Snacks: Atkins snack bars, nuts o Drinks: Diet soda 1x per day, but everything else is water or flavored waters  . Current exercise:  o Walking, plays a lot of golf lately, at least 3x per week and walking 9 holes  . Cardiovascular risk reduction: o Current hypertensive regimen: none o Current hyperlipidemia regimen: none; 10 year ASCVD risk 5.2%; declined statin at last PCP visit  Pharmacist Clinical Goal(s):  [name] Over the next 90 days, patient with work with PharmD and primary care provider to address optimized medication management  Interventions: . Comprehensive medication review performed, medication list updated in electronic medical record . Inter-disciplinary care team collaboration (see longitudinal plan of care) . Reviewed goal A1c, goal fasting glucose, and goal 2 hour post prandial glucose. [name] Extensive discussion of diet and lifestyle modifications, including goal of 150 minutes of moderate intensity exercise weekly. Encouraged continued focus on reducing carbohydrate and portion sizes.  . Discussed recommendation of moderate intensity statin therapy, discussed pleiotropic benefits. Encouraged patient to discuss w/ PCP at upcoming appointment . Patient requested continued CCM support  and follow up  Patient Self Care Activities:  . Patient will check blood glucose regularly , document, and provide at future appointment . Patient will take medications as prescribed . Patient will report any questions or concerns to provider   Please see past updates related to this goal by clicking on the "Past Updates" button in the selected goal         The patient verbalized understanding of instructions provided today and declined a print copy of patient instruction materials.   Plan:  - Scheduled f/u call in ~ 12 weeks  [name] Feliz Beam, PharmD, [place], CPP Clinical Pharmacist [facility] [employer] [phone]

## 2020-02-12 NOTE — Chronic Care Management (AMB) (Signed)
Chronic Care Management   Follow Up Note   02/12/2020 Name: Daune Colgate Segers Jr. MRN: [id number] DOB: [birth year]  Referred by: Burnard Hawthorne, FNP Reason for referral : Chronic Care Management (Medication Management)   [name]. is a 49 y.o. year old male who is a primary care patient of Burnard Hawthorne, FNP. The CCM team was consulted for assistance with chronic disease management and care coordination needs.    Contacted patient for medication management review.   Review of patient status, including review of consultants reports, relevant laboratory and other test results, and collaboration with appropriate care team members and the patient's provider was performed as part of comprehensive patient evaluation and provision of chronic care management services.    SDOH (Social Determinants of Health) assessments performed: Yes See Care Plan activities for detailed interventions related to SDOH)  SDOH Interventions     Most Recent Value  SDOH Interventions  Physical Activity Interventions Other (Comments)  [encouraged continued focus on exercise]       Outpatient Encounter Medications as of 02/12/2020  Medication Sig  . blood glucose meter kit and supplies Dispense based on patient and insurance preference. Use up to four times daily as directed. (FOR ICD-10 E10.9, E11.9).  . CINNAMON PO Take 1 tablet by mouth daily.  [name] glucose blood (ACCU-CHEK GUIDE) test strip Use to check blood sugar twice daily  . metFORMIN (GLUCOPHAGE) 1000 MG tablet Start with 1000 mg by mouth daily for 7 days, then increase to 1000 mg by mouth 2x per day thereafter.  . [DISCONTINUED] cyclobenzaprine (FLEXERIL) 5 MG tablet Take 1 tablet (5 mg total) by mouth at bedtime as needed for muscle spasms. (Patient not taking: Reported on 02/12/2020)  . [DISCONTINUED] meloxicam (MOBIC) 7.5 MG tablet TAKE 1 TABLET(7.5 MG) BY MOUTH DAILY AS NEEDED FOR PAIN (Patient not taking: Reported on 02/12/2020)   No  facility-administered encounter medications on file as of 02/12/2020.     Objective:   Goals Addressed              This Visit's Progress     Patient Stated   .  "The medication is too expensive" (pt-stated)        CARE PLAN ENTRY (see longtitudinal plan of care for additional care plan information)  Current Barriers:  . Diabetes: uncontrolled but improved per SMBG results; most recent A1c 7.3% o Reports that for a bit, weight was ~180s. Has trended back up to 193 lbs this week . Current antihyperglycemic regimen: metformin 1000 mg BID . Current blood glucose readings:  o Fasting: 90s-130s o 2-3 hours after lunch: higher readings if higher portions; salad + protein (tuna), readings ~160; if a larger salad then readings ~200s  . Current meal patterns:  o Snacks: Atkins snack bars, nuts o Drinks: Diet soda 1x per day, but everything else is water or flavored waters  . Current exercise:  o Walking, plays a lot of golf lately, at least 3x per week and walking 9 holes  . Cardiovascular risk reduction: o Current hypertensive regimen: none o Current hyperlipidemia regimen: none; 10 year ASCVD risk 5.2%; declined statin at last PCP visit  Pharmacist Clinical Goal(s):  [name] Over the next 90 days, patient with work with PharmD and primary care provider to address optimized medication management  Interventions: . Comprehensive medication review performed, medication list updated in electronic medical record . Inter-disciplinary care team collaboration (see longitudinal plan of care) . Reviewed goal A1c, goal fasting glucose,  and goal 2 hour post prandial glucose. [name] Extensive discussion of diet and lifestyle modifications, including goal of 150 minutes of moderate intensity exercise weekly. Encouraged continued focus on reducing carbohydrate and portion sizes.  . Discussed recommendation of moderate intensity statin therapy, discussed pleiotropic benefits. Encouraged patient to discuss w/  PCP at upcoming appointment . Patient requested continued CCM support and follow up  Patient Self Care Activities:  . Patient will check blood glucose regularly , document, and provide at future appointment . Patient will take medications as prescribed . Patient will report any questions or concerns to provider   Please see past updates related to this goal by clicking on the "Past Updates" button in the selected goal          Plan:  - Scheduled f/u call in ~ 12 weeks  [name], PharmD, [place], [place] Pharmacist Estill Duchesne [phone]

## 2020-02-12 NOTE — Progress Notes (Signed)
Agree with plan. Abbagale Goguen, NP  

## 2020-02-12 NOTE — Progress Notes (Deleted)
   Subjective:    Patient ID: Andrew Loma., male    DOB: 03/31/1971, 49 y.o.   MRN: [id number]  CC: Andrew Graca Melfi Brooke Bonito. is a 48 y.o. male who presents today for follow up.   HPI: DM  Needs statin therapy   HISTORY:  Past Medical History:  Diagnosis Date  . Chicken pox   . Kidney stones   . Umbilical hernia 4/97/0263   Past Surgical History:  Procedure Laterality Date  . ANKLE SURGERY     x2  . EPIGASTRIC HERNIA REPAIR N/A 03/30/2015   Procedure: HERNIA REPAIR EPIGASTRIC ADULT;  Surgeon: Robert Bellow, MD;  Location: [facility] ORS;  Service: General;  Laterality: N/A;  . HERNIA REPAIR  7858   umbilical hernia  . HERNIA REPAIR  03/30/2015   Epigastric and umbilical hernia with retrorectus 6.4 cm Ventralex mesh  . left and right ankle repair ligament    . nasal spectrum    . rotator cuff right    . SHOULDER SURGERY     rotator cuff   Family History  Problem Relation Age of Onset  . Heart disease Paternal Grandfather   . Heart disease Maternal Grandfather   . Diabetes Maternal Grandmother   . Hypertension Mother   . Hyperlipidemia Father     Allergies: Patient has no known allergies. Current Outpatient Medications on File Prior to Visit  Medication Sig Dispense Refill  . blood glucose meter kit and supplies Dispense based on patient and insurance preference. Use up to four times daily as directed. (FOR ICD-10 E10.9, E11.9). 1 each 0  . CINNAMON PO Take 1 tablet by mouth daily.    [name] glucose blood (ACCU-CHEK GUIDE) test strip Use to check blood sugar twice daily 200 each 3  . metFORMIN (GLUCOPHAGE) 1000 MG tablet Start with 1000 mg by mouth daily for 7 days, then increase to 1000 mg by mouth 2x per day thereafter. 180 tablet 3   No current facility-administered medications on file prior to visit.    Social History   Tobacco Use  . Smoking status: Never Smoker  . Smokeless tobacco: Never Used  Substance Use Topics  . Alcohol use: Yes    Alcohol/week: 0.0  standard drinks    Comment: 2x month  . Drug use: No    Review of Systems    Objective:    There were no vitals taken for this visit. BP Readings from Last 3 Encounters:  10/08/19 116/70  06/16/19 120/80  05/31/15 140/80   Wt Readings from Last 3 Encounters:  10/08/19 198 lb 12.8 oz (90.2 kg)  08/06/19 195 lb (88.5 kg)  06/20/19 209 lb (94.8 kg)    Physical Exam     Assessment & Plan:   Problem List Items Addressed This Visit    None       I am having Andrew Bushy. Goldenstein Jr. "SONNY" maintain his metFORMIN, blood glucose meter kit and supplies, Accu-Chek Guide, and CINNAMON PO.   No orders of the defined types were placed in this encounter.   Return precautions given.   Risks, benefits, and alternatives of the medications and treatment plan prescribed today were discussed, and patient expressed understanding.   Education regarding symptom management and diagnosis given to patient on AVS.  Continue to follow with Andrew Hawthorne, FNP for routine health maintenance.   Andrew Bushy Nowak Jr. and I agreed with plan.   [name], FNP

## 2020-02-16 ENCOUNTER — Other Ambulatory Visit: Payer: Self-pay

## 2020-02-16 ENCOUNTER — Other Ambulatory Visit (INDEPENDENT_AMBULATORY_CARE_PROVIDER_SITE_OTHER): Payer: BC Managed Care – PPO

## 2020-02-16 DIAGNOSIS — E119 Type 2 diabetes mellitus without complications: Secondary | ICD-10-CM | POA: Diagnosis not present

## 2020-02-16 DIAGNOSIS — E785 Hyperlipidemia, unspecified: Secondary | ICD-10-CM

## 2020-02-16 LAB — LIPID PANEL
Cholesterol: 150 mg/dL (ref 0–200)
HDL: 35.1 mg/dL — ABNORMAL LOW (ref >39.00)
LDL Cholesterol: 87 mg/dL (ref 0–99)
NonHDL: 115.06
Total CHOL/HDL Ratio: 4
Triglycerides: 141 mg/dL (ref 0.0–149.0)
VLDL: 28.2 mg/dL (ref 0.0–40.0)

## 2020-02-16 LAB — COMPREHENSIVE METABOLIC PANEL
ALT: 23 U/L (ref 0–53)
AST: 15 U/L (ref 0–37)
Albumin: 4.5 g/dL (ref 3.5–5.2)
Alkaline Phosphatase: 38 U/L — ABNORMAL LOW (ref 39–117)
BUN: 14 mg/dL (ref 6–23)
CO2: 28 mEq/L (ref 19–32)
Calcium: 9.2 mg/dL (ref 8.4–10.5)
Chloride: 102 mEq/L (ref 96–112)
Creatinine, Ser: 0.87 mg/dL (ref 0.40–1.50)
GFR: 92.81 mL/min (ref >60.00)
Glucose, Bld: 203 mg/dL — ABNORMAL HIGH (ref 70–99)
Potassium: 4.2 mEq/L (ref 3.5–5.1)
Sodium: 138 mEq/L (ref 135–145)
Total Bilirubin: 1.1 mg/dL (ref 0.2–1.2)
Total Protein: 6.9 g/dL (ref 6.0–8.3)

## 2020-02-16 LAB — HEMOGLOBIN A1C: Hgb A1c MFr Bld: 6.8 % — ABNORMAL HIGH (ref 4.6–6.5)

## 2020-02-17 DIAGNOSIS — H168 Other keratitis: Secondary | ICD-10-CM | POA: Diagnosis not present

## 2020-02-22 ENCOUNTER — Ambulatory Visit (HOSPITAL_COMMUNITY): Payer: BC Managed Care – PPO

## 2020-02-22 ENCOUNTER — Ambulatory Visit (HOSPITAL_COMMUNITY)
Admission: EM | Admit: 2020-02-22 | Discharge: 2020-02-22 | Disposition: A | Discharge status: Home / Self Care | Payer: BC Managed Care – PPO | Attending: Urgent Care | Admitting: Urgent Care

## 2020-02-22 ENCOUNTER — Other Ambulatory Visit: Payer: Self-pay

## 2020-02-22 ENCOUNTER — Encounter (HOSPITAL_COMMUNITY): Payer: Self-pay

## 2020-02-22 ENCOUNTER — Ambulatory Visit (INDEPENDENT_AMBULATORY_CARE_PROVIDER_SITE_OTHER): Payer: BC Managed Care – PPO

## 2020-02-22 DIAGNOSIS — M25531 Pain in right wrist: Secondary | ICD-10-CM

## 2020-02-22 DIAGNOSIS — S60211A Contusion of right wrist, initial encounter: Secondary | ICD-10-CM

## 2020-02-22 DIAGNOSIS — W19XXXA Unspecified fall, initial encounter: Secondary | ICD-10-CM

## 2020-02-22 MED ORDER — KETOROLAC TROMETHAMINE 60 MG/2ML IM SOLN
60.0000 mg | Freq: Once | INTRAMUSCULAR | Status: AC
  Administered 2020-02-22: 60 mg via INTRAMUSCULAR

## 2020-02-22 MED ORDER — KETOROLAC TROMETHAMINE 60 MG/2ML IM SOLN
INTRAMUSCULAR | Status: AC
  Filled 2020-02-22: qty 2

## 2020-02-22 MED ORDER — NAPROXEN 500 MG PO TABS
500.0000 mg | ORAL_TABLET | Freq: Two times a day (BID) | ORAL | 0 refills | Status: DC
Start: 2020-02-22 — End: 2020-07-26

## 2020-02-22 NOTE — ED Provider Notes (Signed)
[facility]   MRN: [id number] DOB: 07/06/1971  Subjective:   Andrew S Albaugh Jr. is a 49 y.o. male presenting for acute onset of right wrist pain with swelling.  Patient states that he fell in a very awkward way as he was in a really dark room.  He tripped and put his hand out, broke his fall but hurt his wrist.  Has not taken anything for pain.  Denies bruising, bony deformity.  No current facility-administered medications for this encounter.  Current Outpatient Medications:  .  blood glucose meter kit and supplies, Dispense based on patient and insurance preference. Use up to four times daily as directed. (FOR ICD-10 E10.9, E11.9)., Disp: 1 each, Rfl: 0 .  CINNAMON PO, Take 1 tablet by mouth daily., Disp: , Rfl:  .  glucose blood (ACCU-CHEK GUIDE) test strip, Use to check blood sugar twice daily, Disp: 200 each, Rfl: 3 .  metFORMIN (GLUCOPHAGE) 1000 MG tablet, Start with 1000 mg by mouth daily for 7 days, then increase to 1000 mg by mouth 2x per day thereafter., Disp: 180 tablet, Rfl: 3   No Known Allergies  Past Medical History:  Diagnosis Date  . Chicken pox   . Kidney stones   . Umbilical hernia 10/17/2011     Past Surgical History:  Procedure Laterality Date  . ANKLE SURGERY     x2  . EPIGASTRIC HERNIA REPAIR N/A 03/30/2015   Procedure: HERNIA REPAIR EPIGASTRIC ADULT;  Surgeon: Jeffrey W Byrnett, MD;  Location: [facility] ORS;  Service: General;  Laterality: N/A;  . HERNIA REPAIR  2014   umbilical hernia  . HERNIA REPAIR  03/30/2015   Epigastric and umbilical hernia with retrorectus 6.4 cm Ventralex mesh  . left and right ankle repair ligament    . nasal spectrum    . rotator cuff right    . SHOULDER SURGERY     rotator cuff    Family History  Problem Relation Age of Onset  . Heart disease Paternal Grandfather   . Heart disease Maternal Grandfather   . Diabetes Maternal Grandmother   . Hypertension Mother   . Hyperlipidemia Father     Social History    Tobacco Use  . Smoking status: Never Smoker  . Smokeless tobacco: Never Used  Substance Use Topics  . Alcohol use: Yes    Alcohol/week: 0.0 standard drinks    Comment: 2x month  . Drug use: No    ROS   Objective:   Vitals: BP 138/89 (BP Location: Left Arm)   Pulse 91   Temp 98 F (36.7 C) (Oral)   Resp 18   SpO2 97%   Physical Exam Constitutional:      General: He is not in acute distress.    Appearance: Normal appearance. He is well-developed and normal weight. He is not ill-appearing, toxic-appearing or diaphoretic.  HENT:     Head: Normocephalic and atraumatic.     Right Ear: External ear normal.     Left Ear: External ear normal.     Nose: Nose normal.     Mouth/Throat:     Pharynx: Oropharynx is clear.  Eyes:     General: No scleral icterus.       Right eye: No discharge.        Left eye: No discharge.     Extraocular Movements: Extraocular movements intact.     Pupils: Pupils are equal, round, and reactive to light.  Cardiovascular:     Rate and Rhythm:   Normal rate.  Pulmonary:     Effort: Pulmonary effort is normal.  Musculoskeletal:     Right wrist: Swelling (distally, ulnar aspect), tenderness (over area outlined) and bony tenderness present. No deformity, snuff box tenderness or crepitus. Decreased range of motion.     Right hand: No swelling, deformity, lacerations, tenderness or bony tenderness. Normal range of motion. Normal strength. Normal sensation. Normal capillary refill.       Hands:     Cervical back: Normal range of motion.  Skin:    General: Skin is warm and dry.  Neurological:     Mental Status: He is alert and oriented to person, place, and time.  Psychiatric:        Mood and Affect: Mood normal.        Behavior: Behavior normal.        Thought Content: Thought content normal.        Judgment: Judgment normal.     DG Wrist Complete Right  Result Date: 02/22/2020 CLINICAL DATA:  Right wrist pain after fall at work. EXAM: RIGHT  WRIST - COMPLETE 3+ VIEW COMPARISON:  None. FINDINGS: There is no evidence of fracture or dislocation. There is no evidence of arthropathy or other focal bone abnormality. Soft tissues are unremarkable. IMPRESSION: Negative. Electronically Signed   By: James  Green Jr M.D.   On: 02/22/2020 12:48    Assessment and Plan :   PDMP not reviewed this encounter.  1. Right wrist pain   2. Fall, initial encounter   3. Contusion of right wrist, initial encounter     Right wrist wrapped using an Ace wrap.  Recommended conservative management including rest, naproxen for pain and inflammation.  Offered a note for work but he refused. Counseled patient on potential for adverse effects with medications prescribed/recommended today, ER and return-to-clinic precautions discussed, patient verbalized understanding.    Mani, Mario, PA-C 02/22/20 1305  

## 2020-02-22 NOTE — ED Triage Notes (Signed)
Pt presents wist pain in the right wrist, right arm and right pinky finger and right ring finger x 3 hrs, after he tripped and fell with at box at work, when he was checking why the power was out. Pt has not take any medication for the pain.

## 2020-02-23 ENCOUNTER — Ambulatory Visit: Payer: BC Managed Care – PPO | Admitting: Family

## 2020-03-05 ENCOUNTER — Encounter: Payer: Self-pay | Admitting: Family

## 2020-03-05 ENCOUNTER — Ambulatory Visit (INDEPENDENT_AMBULATORY_CARE_PROVIDER_SITE_OTHER): Payer: BC Managed Care – PPO | Admitting: Family

## 2020-03-05 ENCOUNTER — Other Ambulatory Visit: Payer: Self-pay

## 2020-03-05 VITALS — BP 112/74 | HR 87 | Temp 97.8°F | Ht 67.76 in | Wt 198.6 lb

## 2020-03-05 DIAGNOSIS — E119 Type 2 diabetes mellitus without complications: Secondary | ICD-10-CM

## 2020-03-05 DIAGNOSIS — E785 Hyperlipidemia, unspecified: Secondary | ICD-10-CM

## 2020-03-05 DIAGNOSIS — L989 Disorder of the skin and subcutaneous tissue, unspecified: Secondary | ICD-10-CM | POA: Insufficient documentation

## 2020-03-05 DIAGNOSIS — Z1211 Encounter for screening for malignant neoplasm of colon: Secondary | ICD-10-CM | POA: Diagnosis not present

## 2020-03-05 MED ORDER — ROSUVASTATIN CALCIUM 10 MG PO TABS: 10.0000 mg | ORAL_TABLET | Freq: Every day | ORAL | 3 refills | Status: DC

## 2020-03-05 NOTE — Assessment & Plan Note (Signed)
Agreeable to starting statin, Crestor ordered.  CMP in 6 weeks

## 2020-03-05 NOTE — Patient Instructions (Addendum)
Start crestor Labs in 6 weeks  Referral for colonoscopy Let us know if you dont hear back within a week in regards to an appointment being scheduled.    Rosuvastatin Tablets What is this medicine? ROSUVASTATIN (roe SOO va sta tin) is known as a HMG-CoA reductase inhibitor or 'statin'. It lowers cholesterol and triglycerides in the blood. This drug may also reduce the risk of heart attack, stroke, or other health problems in patients with risk factors for heart disease. Diet and lifestyle changes are often used with this drug. This medicine may be used for other purposes; ask your health care provider or pharmacist if you have questions. COMMON BRAND NAME(S): Crestor What should I tell my health care provider before I take this medicine? They need to know if you have any of these conditions:  diabetes  if you often drink alcohol  history of stroke  kidney disease  liver disease  muscle aches or weakness  thyroid disease  an unusual or allergic reaction to rosuvastatin, other medicines, foods, dyes, or preservatives  pregnant or trying to get pregnant  breast-feeding How should I use this medicine? Take this medicine by mouth with a glass of water. Follow the directions on the prescription label. Do not cut, crush or chew this medicine. You can take this medicine with or without food. Take your doses at regular intervals. Do not take your medicine more often than directed. Talk to your pediatrician regarding the use of this medicine in children. While this drug may be prescribed for children as young as 17 years old for selected conditions, precautions do apply. Overdosage: If you think you have taken too much of this medicine contact a poison control center or emergency room at once. NOTE: This medicine is only for you. Do not share this medicine with others. What if I miss a dose? If you miss a dose, take it as soon as you can. If your next dose is to be taken in less than 12  hours, then do not take the missed dose. Take the next dose at your regular time. Do not take double or extra doses. What may interact with this medicine? Do not take this medicine with any of the following medications:  herbal medicines like red yeast rice This medicine may also interact with the following medications:  alcohol  antacids containing aluminum hydroxide or magnesium hydroxide  cyclosporine  other medicines for high cholesterol  some medicines for HIV infection  warfarin This list may not describe all possible interactions. Give your health care provider a list of all the medicines, herbs, non-prescription drugs, or dietary supplements you use. Also tell them if you smoke, drink alcohol, or use illegal drugs. Some items may interact with your medicine. What should I watch for while using this medicine? Visit your doctor or health care professional for regular check-ups. You may need regular tests to make sure your liver is working properly. Your health care professional may tell you to stop taking this medicine if you develop muscle problems. If your muscle problems do not go away after stopping this medicine, contact your health care professional. Do not become pregnant while taking this medicine. Women should inform their health care professional if they wish to become pregnant or think they might be pregnant. There is a potential for serious side effects to an unborn child. Talk to your health care professional or pharmacist for more information. Do not breast-feed an infant while taking this medicine. This medicine may increase  blood sugar. Ask your healthcare provider if changes in diet or medicines are needed if you have diabetes. If you are going to need surgery or other procedure, tell your doctor that you are using this medicine. This drug is only part of a total heart-health program. Your doctor or a dietician can suggest a low-cholesterol and low-fat diet to help.  Avoid alcohol and smoking, and keep a proper exercise schedule. This medicine may cause a decrease in Co-Enzyme Q-10. You should make sure that you get enough Co-Enzyme Q-10 while you are taking this medicine. Discuss the foods you eat and the vitamins you take with your health care professional. What side effects may I notice from receiving this medicine? Side effects that you should report to your doctor or health care professional as soon as possible:  allergic reactions like skin rash, itching or hives, swelling of the face, lips, or tongue  confusion  joint pain  loss of memory  redness, blistering, peeling or loosening of the skin, including inside the mouth  signs and symptoms of high blood sugar such as being more thirsty or hungry or having to urinate more than normal. You may also feel very tired or have blurry vision.  signs and symptoms of muscle injury like dark urine; trouble passing urine or change in the amount of urine; unusually weak or tired; muscle pain or side or back pain  yellowing of the eyes or skin Side effects that usually do not require medical attention (report to your doctor or health care professional if they continue or are bothersome):  constipation  diarrhea  dizziness  gas  headache  nausea  stomach pain  trouble sleeping  upset stomach This list may not describe all possible side effects. Call your doctor for medical advice about side effects. You may report side effects to FDA at 1-800-FDA-1088. Where should I keep my medicine? Keep out of the reach of children. Store at room temperature between 20 and 25 degrees C (68 and 77 degrees F). Keep container tightly closed (protect from moisture). Throw away any unused medicine after the expiration date. NOTE: This sheet is a summary. It may not cover all possible information. If you have questions about this medicine, talk to your doctor, pharmacist, or health care provider.  2020  Elsevier/Gold Standard (2018-05-16 08:25:08)

## 2020-03-05 NOTE — Assessment & Plan Note (Signed)
Improved, continue current regimen

## 2020-03-05 NOTE — Assessment & Plan Note (Signed)
Etiology of lesion is unclear at this time.  It is in the sun exposed area and patient has a history of squamous cell cancer.  I asked if I can make a dermatology referral from today.  He politely declines and states that he would like to call his old dermatologist.  I discussed the importance of this being seen by dermatologist to ensure that is not cancerous.  He verbalized understanding of this importance.

## 2020-03-05 NOTE — Progress Notes (Signed)
Subjective:    Patient ID: Andrew Loma., male    DOB: [birth year], 49 y.o.   MRN: [id number]  CC: Andrew Tenbrink Cavalieri Brooke Bonito. is a 49 y.o. male who presents today for follow up.   HPI: Feels well today  He does note regional and it is noticed there is been there for over a year.  No bleeding, itching.  Has a history of squamous cell carcinoma.  He is dermatologist in town, although does not recall name at this time.    HLD-would like to consider cholesterol medication.  DM-compliant with Metformin, tolerating well       LDL 87 a1c 6.8  HISTORY:  Past Medical History:  Diagnosis Date  . Chicken pox   . Kidney stones   . Umbilical hernia 3/87/5643   Past Surgical History:  Procedure Laterality Date  . ANKLE SURGERY     x2  . EPIGASTRIC HERNIA REPAIR N/A 03/30/2015   Procedure: HERNIA REPAIR EPIGASTRIC ADULT;  Surgeon: Robert Bellow, MD;  Location: [facility] ORS;  Service: General;  Laterality: N/A;  . HERNIA REPAIR  3295   umbilical hernia  . HERNIA REPAIR  03/30/2015   Epigastric and umbilical hernia with retrorectus 6.4 cm Ventralex mesh  . left and right ankle repair ligament    . nasal spectrum    . rotator cuff right    . SHOULDER SURGERY     rotator cuff   Family History  Problem Relation Age of Onset  . Heart disease Paternal Grandfather   . Hyperlipidemia Paternal Grandfather   . Heart disease Maternal Grandfather   . Hyperlipidemia Maternal Grandfather   . Diabetes Maternal Grandmother   . Hypertension Mother   . Hyperlipidemia Father     Allergies: Patient has no known allergies. Current Outpatient Medications on File Prior to Visit  Medication Sig Dispense Refill  . blood glucose meter kit and supplies Dispense based on patient and insurance preference. Use up to four times daily as directed. (FOR ICD-10 E10.9, E11.9). 1 each 0  . CINNAMON PO Take 1 tablet by mouth daily.    [name] glucose blood (ACCU-CHEK GUIDE) test strip Use to check blood sugar  twice daily 200 each 3  . metFORMIN (GLUCOPHAGE) 1000 MG tablet Start with 1000 mg by mouth daily for 7 days, then increase to 1000 mg by mouth 2x per day thereafter. 180 tablet 3  . naproxen (NAPROSYN) 500 MG tablet Take 1 tablet (500 mg total) by mouth 2 (two) times daily with a meal. 30 tablet 0   No current facility-administered medications on file prior to visit.    Social History   Tobacco Use  . Smoking status: Never Smoker  . Smokeless tobacco: Never Used  Substance Use Topics  . Alcohol use: Yes    Alcohol/week: 0.0 standard drinks    Comment: 2x month  . Drug use: No    Review of Systems  Constitutional: Negative for chills and fever.  Respiratory: Negative for cough.   Cardiovascular: Negative for chest pain and palpitations.  Gastrointestinal: Negative for nausea and vomiting.  Skin: Negative for rash and wound.      Objective:    BP 112/74 (BP Location: Left Arm, Patient Position: Sitting)   Pulse 87   Temp 97.8 F (36.6 C)   Ht 5' 7.76" (1.721 m)   Wt 198 lb 9.6 oz (90.1 kg)   SpO2 97%   BMI 30.41 kg/m  BP Readings from Last 3 Encounters:  03/05/20 112/74  02/22/20 138/89  10/08/19 116/70   Wt Readings from Last 3 Encounters:  03/05/20 198 lb 9.6 oz (90.1 kg)  10/08/19 198 lb 12.8 oz (90.2 kg)  08/06/19 195 lb (88.5 kg)    Physical Exam Vitals reviewed.  Constitutional:      Appearance: He is well-developed.  HENT:     Head:      Comments: Small area as noted on end of nose with fine pink, red lines. Able to palpate nodule underneath. No purulent discharge, erythema, increased warmth.  Cardiovascular:     Rate and Rhythm: Regular rhythm.     Heart sounds: Normal heart sounds.  Pulmonary:     Effort: Pulmonary effort is normal. No respiratory distress.     Breath sounds: Normal breath sounds. No wheezing, rhonchi or rales.  Skin:    General: Skin is warm and dry.  Neurological:     Mental Status: He is alert.  Psychiatric:         Speech: Speech normal.        Behavior: Behavior normal.        Assessment & Plan:   Problem List Items Addressed This Visit      Endocrine   Diabetes mellitus without complication ([place])    Improved, continue current regimen.      Relevant Medications   rosuvastatin (CRESTOR) 10 MG tablet   Other Relevant Orders   HIV Antibody (routine testing w rflx)   Hepatitis C antibody     Musculoskeletal and Integument   Skin lesion    Etiology of lesion is unclear at this time.  It is in the sun exposed area and patient has a history of squamous cell cancer.  I asked if I can make a dermatology referral from today.  He politely declines and states that he would like to call his old dermatologist.  I discussed the importance of this being seen by dermatologist to ensure that is not cancerous.  He verbalized understanding of this importance.         Other   HLD (hyperlipidemia) - Primary    Agreeable to starting statin, Crestor ordered.  CMP in 6 weeks      Relevant Medications   rosuvastatin (CRESTOR) 10 MG tablet   Other Relevant Orders   Comprehensive metabolic panel    Other Visit Diagnoses    Screen for colon cancer       Relevant Orders   Ambulatory referral to Gastroenterology    Of note, politely declines pneumococcal vaccine   I am having Andrew Bushy. Skellenger Jr. "SONNY" start on rosuvastatin. I am also having him maintain his metFORMIN, blood glucose meter kit and supplies, Accu-Chek Guide, CINNAMON PO, and naproxen.   Meds ordered this encounter  Medications  . rosuvastatin (CRESTOR) 10 MG tablet    Sig: Take 1 tablet (10 mg total) by mouth daily.    Dispense:  90 tablet    Refill:  3    Order Specific Question:   Supervising Provider    Answer:   Crecencio Mc [[id number]]    Return precautions given.   Risks, benefits, and alternatives of the medications and treatment plan prescribed today were discussed, and patient expressed understanding.   Education  regarding symptom management and diagnosis given to patient on AVS.  Continue to follow with Burnard Hawthorne, FNP for routine health maintenance.   Andrew Bushy Mooneyhan Jr. and I agreed with plan.   [name], FNP

## 2020-03-15 ENCOUNTER — Other Ambulatory Visit: Payer: Self-pay

## 2020-03-15 ENCOUNTER — Telehealth (INDEPENDENT_AMBULATORY_CARE_PROVIDER_SITE_OTHER): Payer: Self-pay | Admitting: Gastroenterology

## 2020-03-15 DIAGNOSIS — Z1211 Encounter for screening for malignant neoplasm of colon: Secondary | ICD-10-CM

## 2020-03-15 MED ORDER — NA SULFATE-K SULFATE-MG SULF 17.5-3.13-1.6 GM/177ML PO SOLN: 1.0000 | Freq: Once | ORAL | 0 refills | Status: AC

## 2020-03-15 NOTE — Progress Notes (Signed)
Gastroenterology Pre-Procedure Review  Request Date: Monday 03/29/20 Requesting Physician: Dr. Allen Norris  PATIENT REVIEW QUESTIONS: The patient responded to the following health history questions as indicated:    1. Are you having any GI issues? no 2. Do you have a personal history of Polyps? no 3. Do you have a family history of Colon Cancer or Polyps? no 4. Diabetes Mellitus? no 5. Joint replacements in the past 12 months?no 6. Major health problems in the past 3 months?no 7. Any artificial heart valves, MVP, or defibrillator?no    MEDICATIONS & ALLERGIES:    Patient reports the following regarding taking any anticoagulation/antiplatelet therapy:   Plavix, Coumadin, Eliquis, Xarelto, Lovenox, Pradaxa, Brilinta, or Effient? no Aspirin? no  Patient confirms/reports the following medications:  Current Outpatient Medications  Medication Sig Dispense Refill  . blood glucose meter kit and supplies Dispense based on patient and insurance preference. Use up to four times daily as directed. (FOR ICD-10 E10.9, E11.9). 1 each 0  . CINNAMON PO Take 1 tablet by mouth daily.    [name] glucose blood (ACCU-CHEK GUIDE) test strip Use to check blood sugar twice daily 200 each 3  . metFORMIN (GLUCOPHAGE) 1000 MG tablet Start with 1000 mg by mouth daily for 7 days, then increase to 1000 mg by mouth 2x per day thereafter. 180 tablet 3  . rosuvastatin (CRESTOR) 10 MG tablet Take 1 tablet (10 mg total) by mouth daily. 90 tablet 3  . Na Sulfate-K Sulfate-Mg Sulf 17.5-3.13-1.6 GM/177ML SOLN Take 1 kit by mouth once for 1 dose. 354 mL 0  . naproxen (NAPROSYN) 500 MG tablet Take 1 tablet (500 mg total) by mouth 2 (two) times daily with a meal. (Patient not taking: Reported on 03/15/2020) 30 tablet 0   No current facility-administered medications for this visit.    Patient confirms/reports the following allergies:  No Known Allergies  Orders Placed This Encounter  Procedures  . Procedural/ Surgical Case Request:  COLONOSCOPY WITH PROPOFOL    Standing Status:   Standing    Number of Occurrences:   1    Order Specific Question:   Pre-op diagnosis    Answer:   screening colonoscopy    Order Specific Question:   CPT Code    Answer:   22482    AUTHORIZATION INFORMATION Primary Insurance: 1D#: Group #:  Secondary Insurance: 1D#: Group #:  SCHEDULE INFORMATION: Date: 03/29/20 Time: Location:[facility]

## 2020-04-16 ENCOUNTER — Other Ambulatory Visit (INDEPENDENT_AMBULATORY_CARE_PROVIDER_SITE_OTHER): Payer: BC Managed Care – PPO

## 2020-04-16 ENCOUNTER — Other Ambulatory Visit: Payer: Self-pay

## 2020-04-16 ENCOUNTER — Other Ambulatory Visit: Payer: BC Managed Care – PPO

## 2020-04-16 DIAGNOSIS — E119 Type 2 diabetes mellitus without complications: Secondary | ICD-10-CM | POA: Diagnosis not present

## 2020-04-16 DIAGNOSIS — E785 Hyperlipidemia, unspecified: Secondary | ICD-10-CM | POA: Diagnosis not present

## 2020-04-16 LAB — COMPREHENSIVE METABOLIC PANEL
ALT: 21 U/L (ref 0–53)
AST: 15 U/L (ref 0–37)
Albumin: 4.5 g/dL (ref 3.5–5.2)
Alkaline Phosphatase: 52 U/L (ref 39–117)
BUN: 15 mg/dL (ref 6–23)
CO2: 33 mEq/L — ABNORMAL HIGH (ref 19–32)
Calcium: 9.6 mg/dL (ref 8.4–10.5)
Chloride: 101 mEq/L (ref 96–112)
Creatinine, Ser: 0.89 mg/dL (ref 0.40–1.50)
GFR: 90.35 mL/min (ref >60.00)
Glucose, Bld: 146 mg/dL — ABNORMAL HIGH (ref 70–99)
Potassium: 4.2 mEq/L (ref 3.5–5.1)
Sodium: 140 mEq/L (ref 135–145)
Total Bilirubin: 0.8 mg/dL (ref 0.2–1.2)
Total Protein: 7.1 g/dL (ref 6.0–8.3)

## 2020-04-16 NOTE — Addendum Note (Signed)
Addended by: Warden Fillers on: 04/16/2020 02:44 PM   Modules accepted: Orders

## 2020-04-16 NOTE — Addendum Note (Signed)
Addended by: Warden Fillers on: 04/16/2020 02:14 PM   Modules accepted: Orders

## 2020-04-19 LAB — HEPATITIS C ANTIBODY
Hepatitis C Ab: NONREACTIVE
SIGNAL TO CUT-OFF: 0.02 (ref <1.00)

## 2020-04-19 LAB — HIV ANTIBODY (ROUTINE TESTING W REFLEX): HIV 1&2 Ab, 4th Generation: NONREACTIVE

## 2020-04-26 ENCOUNTER — Encounter: Payer: Self-pay | Admitting: Gastroenterology

## 2020-04-26 ENCOUNTER — Other Ambulatory Visit: Payer: Self-pay

## 2020-04-29 ENCOUNTER — Other Ambulatory Visit
Admission: RE | Admit: 2020-04-29 | Discharge: 2020-04-29 | Disposition: A | Discharge status: Home / Self Care | Payer: BC Managed Care – PPO | Source: Ambulatory Visit | Attending: Gastroenterology | Admitting: Gastroenterology

## 2020-04-29 ENCOUNTER — Other Ambulatory Visit: Payer: Self-pay

## 2020-04-29 DIAGNOSIS — Z20822 Contact with and (suspected) exposure to covid-19: Secondary | ICD-10-CM | POA: Insufficient documentation

## 2020-04-29 DIAGNOSIS — Z01812 Encounter for preprocedural laboratory examination: Secondary | ICD-10-CM | POA: Insufficient documentation

## 2020-04-29 LAB — SARS CORONAVIRUS 2 (TAT 6-24 HRS): SARS Coronavirus 2: NEGATIVE

## 2020-04-30 NOTE — Discharge Instructions (Signed)
General Anesthesia, Adult, Care After This sheet gives you information about how to care for yourself after your procedure. Your health care provider may also give you more specific instructions. If you have problems or questions, contact your health care provider. What can I expect after the procedure? After the procedure, the following side effects are common:  Pain or discomfort at the IV site.  Nausea.  Vomiting.  Sore throat.  Trouble concentrating.  Feeling cold or chills.  Weak or tired.  Sleepiness and fatigue.  Soreness and body aches. These side effects can affect parts of the body that were not involved in surgery. Follow these instructions at home:  For at least 24 hours after the procedure:  Have a responsible adult stay with you. It is important to have someone help care for you until you are awake and alert.  Rest as needed.  Do not: ? Participate in activities in which you could fall or become injured. ? Drive. ? Use heavy machinery. ? Drink alcohol. ? Take sleeping pills or medicines that cause drowsiness. ? Make important decisions or sign legal documents. ? Take care of children on your own. Eating and drinking  Follow any instructions from your health care provider about eating or drinking restrictions.  When you feel hungry, start by eating small amounts of foods that are soft and easy to digest (bland), such as toast. Gradually return to your regular diet.  Drink enough fluid to keep your urine pale yellow.  If you vomit, rehydrate by drinking water, juice, or clear broth. General instructions  If you have sleep apnea, surgery and certain medicines can increase your risk for breathing problems. Follow instructions from your health care provider about wearing your sleep device: ? Anytime you are sleeping, including during daytime naps. ? While taking prescription pain medicines, sleeping medicines, or medicines that make you drowsy.  Return to  your normal activities as told by your health care provider. Ask your health care provider what activities are safe for you.  Take over-the-counter and prescription medicines only as told by your health care provider.  If you smoke, do not smoke without supervision.  Keep all follow-up visits as told by your health care provider. This is important. Contact a health care provider if:  You have nausea or vomiting that does not get better with medicine.  You cannot eat or drink without vomiting.  You have pain that does not get better with medicine.  You are unable to pass urine.  You develop a skin rash.  You have a fever.  You have redness around your IV site that gets worse. Get help right away if:  You have difficulty breathing.  You have chest pain.  You have blood in your urine or stool, or you vomit blood. Summary  After the procedure, it is common to have a sore throat or nausea. It is also common to feel tired.  Have a responsible adult stay with you for the first 24 hours after general anesthesia. It is important to have someone help care for you until you are awake and alert.  When you feel hungry, start by eating small amounts of foods that are soft and easy to digest (bland), such as toast. Gradually return to your regular diet.  Drink enough fluid to keep your urine pale yellow.  Return to your normal activities as told by your health care provider. Ask your health care provider what activities are safe for you. This information is not   intended to replace advice given to you by your health care provider. Make sure you discuss any questions you have with your health care provider. Document Revised: 07/27/2017 Document Reviewed: 03/09/2017 Elsevier Patient Education  2020 [employer]  

## 2020-05-02 NOTE — Anesthesia Preprocedure Evaluation (Addendum)
Anesthesia Evaluation  Patient identified by MRN, date of birth, ID band Patient awake    Reviewed: Allergy & Precautions, NPO status , Patient's Chart, lab work & pertinent test results  History of Anesthesia Complications Negative for: history of anesthetic complications  Airway Mallampati: III   Neck ROM: Full    Dental  (+)    Pulmonary neg pulmonary ROS,    Pulmonary exam normal breath sounds clear to auscultation       Cardiovascular Exercise Tolerance: Good negative cardio ROS Normal cardiovascular exam Rhythm:Regular Rate:Normal     Neuro/Psych negative neurological ROS     GI/Hepatic negative GI ROS,   Endo/Other  diabetes, Type 2  Renal/GU Renal disease (nephrolithiasis)     Musculoskeletal   Abdominal   Peds  Hematology negative hematology ROS (+)   Anesthesia Other Findings   Reproductive/Obstetrics                            Anesthesia Physical Anesthesia Plan  ASA: II  Anesthesia Plan: General   Post-op Pain Management:    Induction: Intravenous  PONV Risk Score and Plan: 1 and Propofol infusion, TIVA and Treatment may vary due to age or medical condition  Airway Management Planned: Natural Airway  Additional Equipment:   Intra-op Plan:   Post-operative Plan:   Informed Consent: I have reviewed the patients History and Physical, chart, labs and discussed the procedure including the risks, benefits and alternatives for the proposed anesthesia with the patient or authorized representative who has indicated his/her understanding and acceptance.       Plan Discussed with: CRNA  Anesthesia Plan Comments:        Anesthesia Quick Evaluation

## 2020-05-03 ENCOUNTER — Encounter
Admission: RE | Disposition: A | Discharge status: Home / Self Care | Payer: Self-pay | Source: Home / Self Care | Attending: Gastroenterology

## 2020-05-03 ENCOUNTER — Other Ambulatory Visit: Payer: Self-pay

## 2020-05-03 ENCOUNTER — Ambulatory Visit
Admission: RE | Admit: 2020-05-03 | Discharge: 2020-05-03 | Disposition: A | Discharge status: Home / Self Care | Payer: BC Managed Care – PPO | Attending: Gastroenterology | Admitting: Gastroenterology

## 2020-05-03 ENCOUNTER — Encounter: Payer: Self-pay | Admitting: Gastroenterology

## 2020-05-03 ENCOUNTER — Ambulatory Visit: Payer: BC Managed Care – PPO | Admitting: Anesthesiology

## 2020-05-03 DIAGNOSIS — K648 Other hemorrhoids: Secondary | ICD-10-CM | POA: Diagnosis not present

## 2020-05-03 DIAGNOSIS — Z8619 Personal history of other infectious and parasitic diseases: Secondary | ICD-10-CM | POA: Diagnosis not present

## 2020-05-03 DIAGNOSIS — Z79899 Other long term (current) drug therapy: Secondary | ICD-10-CM | POA: Insufficient documentation

## 2020-05-03 DIAGNOSIS — Z833 Family history of diabetes mellitus: Secondary | ICD-10-CM | POA: Diagnosis not present

## 2020-05-03 DIAGNOSIS — K5730 Diverticulosis of large intestine without perforation or abscess without bleeding: Secondary | ICD-10-CM | POA: Insufficient documentation

## 2020-05-03 DIAGNOSIS — Z1211 Encounter for screening for malignant neoplasm of colon: Secondary | ICD-10-CM | POA: Diagnosis not present

## 2020-05-03 DIAGNOSIS — Z7984 Long term (current) use of oral hypoglycemic drugs: Secondary | ICD-10-CM | POA: Insufficient documentation

## 2020-05-03 DIAGNOSIS — E119 Type 2 diabetes mellitus without complications: Secondary | ICD-10-CM | POA: Insufficient documentation

## 2020-05-03 HISTORY — DX: Presence of spectacles and contact lenses: Z97.3

## 2020-05-03 HISTORY — PX: COLONOSCOPY WITH PROPOFOL: SHX5780

## 2020-05-03 HISTORY — DX: Motion sickness, initial encounter: T75.3XXA

## 2020-05-03 HISTORY — DX: Type 2 diabetes mellitus without complications: E11.9

## 2020-05-03 LAB — GLUCOSE, CAPILLARY
Glucose-Capillary: 164 mg/dL — ABNORMAL HIGH (ref 70–99)
Glucose-Capillary: 175 mg/dL — ABNORMAL HIGH (ref 70–99)

## 2020-05-03 SURGERY — COLONOSCOPY WITH PROPOFOL
Anesthesia: General | Site: Rectum

## 2020-05-03 MED ORDER — LIDOCAINE HCL (CARDIAC) PF 100 MG/5ML IV SOSY
PREFILLED_SYRINGE | INTRAVENOUS | Status: DC | PRN
  Administered 2020-05-03: 60 mg via INTRAVENOUS

## 2020-05-03 MED ORDER — PROPOFOL 10 MG/ML IV BOLUS
INTRAVENOUS | Status: DC | PRN
  Administered 2020-05-03: 30 mg via INTRAVENOUS
  Administered 2020-05-03: 40 mg via INTRAVENOUS
  Administered 2020-05-03: 30 mg via INTRAVENOUS
  Administered 2020-05-03: 50 mg via INTRAVENOUS

## 2020-05-03 MED ORDER — STERILE WATER FOR IRRIGATION IR SOLN: Status: DC | PRN

## 2020-05-03 MED ORDER — SODIUM CHLORIDE 0.9 % IV SOLN: INTRAVENOUS | Status: DC

## 2020-05-03 MED ORDER — LACTATED RINGERS IV SOLN: INTRAVENOUS | Status: DC

## 2020-05-03 SURGICAL SUPPLY — 5 items
GOWN CVR UNV OPN BCK APRN NK (MISCELLANEOUS) ×2 IMPLANT
GOWN ISOL THUMB LOOP REG UNIV (MISCELLANEOUS) ×6
KIT ENDO PROCEDURE OLY (KITS) ×3 IMPLANT
MANIFOLD NEPTUNE II (INSTRUMENTS) ×3 IMPLANT
WATER STERILE IRR 250ML POUR (IV SOLUTION) ×3 IMPLANT

## 2020-05-03 NOTE — Anesthesia Procedure Notes (Signed)
Date/Time: 05/03/2020 7:52 AM Performed by: Lily Kocher, CRNA Oxygen Delivery Method: Nasal cannula Induction Type: IV induction Placement Confirmation: positive ETCO2

## 2020-05-03 NOTE — Anesthesia Postprocedure Evaluation (Signed)
Anesthesia Post Note  Patient: Andrew Holloway.  Procedure(s) Performed: COLONOSCOPY WITH PROPOFOL (N/A Rectum)     Patient location during evaluation: PACU Anesthesia Type: General Level of consciousness: awake and alert, oriented and patient cooperative Pain management: pain level controlled Vital Signs Assessment: post-procedure vital signs reviewed and stable Respiratory status: spontaneous breathing, nonlabored ventilation and respiratory function stable Cardiovascular status: blood pressure returned to baseline and stable Postop Assessment: adequate PO intake Anesthetic complications: no   No complications documented.  Reed Breech

## 2020-05-03 NOTE — Transfer of Care (Signed)
Immediate Anesthesia Transfer of Care Note  Patient: Andrew Holloway.  Procedure(s) Performed: COLONOSCOPY WITH PROPOFOL (N/A Rectum)  Patient Location: PACU  Anesthesia Type: General  Level of Consciousness: awake, alert  and patient cooperative  Airway and Oxygen Therapy: Patient Spontanous Breathing and Patient connected to supplemental oxygen  Post-op Assessment: Post-op Vital signs reviewed, Patient's Cardiovascular Status Stable, Respiratory Function Stable, Patent Airway and No signs of Nausea or vomiting  Post-op Vital Signs: Reviewed and stable  Complications: No complications documented.

## 2020-05-03 NOTE — Op Note (Signed)
[facility] Gastroenterology Patient Name: Andrew Holloway Procedure Date: 05/03/2020 7:10 AM MRN: [id number] Account #: [id number] Date of Birth: September 01, 1970 Admit Type: Outpatient Age: 49 Room: [facility] OR ROOM 01 Gender: Male Note Status: Finalized Procedure:             Colonoscopy Indications:           Screening for colorectal malignant neoplasm Providers:             Midge Minium MD, MD Referring MD:          Lyn Records. Arnett (Referring MD) Medicines:             Propofol per Anesthesia Complications:         No immediate complications. Procedure:             Pre-Anesthesia Assessment:                        - Prior to the procedure, a History and Physical was                         performed, and patient medications and allergies were                         reviewed. The patient's tolerance of previous                         anesthesia was also reviewed. The risks and benefits                         of the procedure and the sedation options and risks                         were discussed with the patient. All questions were                         answered, and informed consent was obtained. Prior                         Anticoagulants: The patient has taken no previous                         anticoagulant or antiplatelet agents. ASA Grade                         Assessment: II - A patient with mild systemic disease.                         After reviewing the risks and benefits, the patient                         was deemed in satisfactory condition to undergo the                         procedure.                        After obtaining informed consent, the colonoscope was  passed under direct vision. Throughout the procedure,                         the patient's blood pressure, pulse, and oxygen                         saturations were monitored continuously. The was                         introduced through the anus and  advanced to the the                         cecum, identified by appendiceal orifice and ileocecal                         valve. The colonoscopy was performed without                         difficulty. The patient tolerated the procedure well.                         The quality of the bowel preparation was excellent. Findings:      The perianal and digital rectal examinations were normal.      A few small-mouthed diverticula were found in the sigmoid colon. Impression:            - Diverticulosis in the sigmoid colon.                        - Non-bleeding internal hemorrhoids. Recommendation:        - Discharge patient to home.                        - Resume previous diet.                        - Continue present medications.                        - Repeat colonoscopy in 10 years for screening unless                         any change in family history or lower GI problems. Procedure Code(s):     --- Professional ---                        [phone], Colonoscopy, flexible; diagnostic, including                         collection of specimen(s) by brushing or washing, when                         performed (separate procedure) Diagnosis Code(s):     --- Professional ---                        Z12.11, Encounter for screening for malignant neoplasm                         of colon CPT copyright 2019 [employer]. All  rights reserved. The codes documented in this report are preliminary and upon coder review may  be revised to meet current compliance requirements. Midge Minium MD, MD 05/03/2020 8:05:10 AM This report has been signed electronically. Number of Addenda: 0 Note Initiated On: 05/03/2020 7:10 AM Scope Withdrawal Time: 0 hours 6 minutes 50 seconds  Total Procedure Duration: 0 hours 8 minutes 24 seconds  Estimated Blood Loss:  Estimated blood loss: none.      [facility]

## 2020-05-03 NOTE — H&P (Signed)
Andrew Lame, MD [address] [place], [place] [address] Phone: [phone] Fax : [phone]  Primary Care Physician:  Burnard Hawthorne, FNP Primary Gastroenterologist:  Dr. Allen Norris  Pre-Procedure History & Physical: HPI:  Andrew Holloway. is a 49 y.o. male is here for a screening colonoscopy.   Past Medical History:  Diagnosis Date  . Chicken pox   . Diabetes mellitus, type 2 ([place])   . Kidney stones   . Motion sickness    back seat of car  . Umbilical hernia 4/58/5929  . Wears contact lenses     Past Surgical History:  Procedure Laterality Date  . ANKLE SURGERY     x2  . EPIGASTRIC HERNIA REPAIR N/A 03/30/2015   Procedure: HERNIA REPAIR EPIGASTRIC ADULT;  Surgeon: Robert Bellow, MD;  Location: [facility] ORS;  Service: General;  Laterality: N/A;  . HERNIA REPAIR  2446   umbilical hernia  . HERNIA REPAIR  03/30/2015   Epigastric and umbilical hernia with retrorectus 6.4 cm Ventralex mesh  . left and right ankle repair ligament    . nasal spectrum    . rotator cuff right    . SHOULDER SURGERY     rotator cuff    Prior to Admission medications   Medication Sig Start Date End Date Taking? Authorizing Provider  CINNAMON PO Take 1 tablet by mouth daily.   Yes [provider]  metFORMIN (GLUCOPHAGE) 1000 MG tablet Start with 1000 mg by mouth daily for 7 days, then increase to 1000 mg by mouth 2x per day thereafter. 06/20/19  Yes Guse, Jacquelynn Cree, FNP  rosuvastatin (CRESTOR) 10 MG tablet Take 1 tablet (10 mg total) by mouth daily. 03/05/20  Yes Arnett, Yvetta Coder, FNP  blood glucose meter kit and supplies Dispense based on patient and insurance preference. Use up to four times daily as directed. (FOR ICD-10 E10.9, E11.9). 06/20/19   [name], FNP  glucose blood (ACCU-CHEK GUIDE) test strip Use to check blood sugar twice daily 09/18/19   Burnard Hawthorne, FNP  naproxen (NAPROSYN) 500 MG tablet Take 1 tablet (500 mg total) by mouth 2 (two) times  daily with a meal. Patient not taking: Reported on 03/15/2020 02/22/20   Jaynee Eagles, PA-C    Allergies as of 03/15/2020  . (No Known Allergies)    Family History  Problem Relation Age of Onset  . Heart disease Paternal Grandfather   . Hyperlipidemia Paternal Grandfather   . Heart disease Maternal Grandfather   . Hyperlipidemia Maternal Grandfather   . Diabetes Maternal Grandmother   . Hypertension Mother   . Hyperlipidemia Father     Social History   Socioeconomic History  . Marital status: Married    Spouse name: Not on file  . Number of children: Not on file  . Years of education: Not on file  . Highest education level: Not on file  Occupational History  . Not on file  Tobacco Use  . Smoking status: Never Smoker  . Smokeless tobacco: Never Used  Vaping Use  . Vaping Use: Never used  Substance and Sexual Activity  . Alcohol use: Yes    Alcohol/week: 0.0 standard drinks    Comment: 2x month  . Drug use: No  . Sexual activity: Yes  Other Topics Concern  . 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  Has 3 adult children      Has 2 grandchildren   Social Determinants of Health   Financial Resource Strain:   . Difficulty of Paying Living Expenses: Not on file  Food Insecurity:   . Worried About Charity fundraiser in the Last Year: Not on file  . Ran Out of Food in the Last Year: Not on file  Transportation Needs:   . Lack of Transportation (Medical): Not on file  . Lack of Transportation (Non-Medical): Not on file  Physical Activity: Insufficiently Active  . Days of Exercise per Week: 3 days  . Minutes of Exercise per Session: 30 min  Stress:   . Feeling of Stress : Not on file  Social Connections:   . Frequency of Communication with Friends and Family: Not on file  . Frequency of Social Gatherings with Friends and Family: Not on file  . Attends Religious Services: Not on  file  . Active Member of Clubs or Organizations: Not on file  . Attends Archivist Meetings: Not on file  . Marital Status: Not on file  Intimate Partner Violence:   . Fear of Current or Ex-Partner: Not on file  . Emotionally Abused: Not on file  . Physically Abused: Not on file  . Sexually Abused: Not on file    Review of Systems: See HPI, otherwise negative ROS  Physical Exam: BP 130/88   Pulse 81   Temp (!) 97.2 F (36.2 C) (Temporal)   Resp 16   Ht 5' 7.76" (1.721 m)   Wt 89.4 kg   SpO2 99%   BMI 30.17 kg/m  General:   Alert,  pleasant and cooperative in NAD Head:  Normocephalic and atraumatic. Neck:  Supple; no masses or thyromegaly. Lungs:  Clear throughout to auscultation.    Heart:  Regular rate and rhythm. Abdomen:  Soft, nontender and nondistended. Normal bowel sounds, without guarding, and without rebound.   Neurologic:  Alert and  oriented x4;  grossly normal neurologically.  Impression/Plan: Andrew Bushy Hessling Jr. is now here to undergo a screening colonoscopy.  Risks, benefits, and alternatives regarding colonoscopy have been reviewed with the patient.  Questions have been answered.  All parties agreeable.

## 2020-05-04 ENCOUNTER — Encounter: Payer: Self-pay | Admitting: Gastroenterology

## 2020-05-10 ENCOUNTER — Ambulatory Visit: Payer: BC Managed Care – PPO | Admitting: Pharmacist

## 2020-05-10 DIAGNOSIS — E785 Hyperlipidemia, unspecified: Secondary | ICD-10-CM

## 2020-05-10 DIAGNOSIS — E119 Type 2 diabetes mellitus without complications: Secondary | ICD-10-CM

## 2020-05-10 NOTE — Chronic Care Management (AMB) (Signed)
Chronic Care Management   Follow Up Note   05/10/2020 Name: Andrew Wix Valliant Jr. MRN: [id number] DOB: 07/08/1971  Referred by: Burnard Hawthorne, FNP Reason for referral : Chronic Care Management (Medication Management)   Andrew Loma. is a 49 y.o. year old male who is a primary care patient of Burnard Hawthorne, FNP. The CCM team was consulted for assistance with chronic disease management and care coordination needs.    Contacted patient for medication management follow up.  Review of patient status, including review of consultants reports, relevant laboratory and other test results, and collaboration with appropriate care team members and the patient's provider was performed as part of comprehensive patient evaluation and provision of chronic care management services.    SDOH (Social Determinants of Health) assessments performed: Yes See Care Plan activities for detailed interventions related to SDOH)  SDOH Interventions     Most Recent Value  SDOH Interventions  Food Insecurity Interventions Intervention Not Indicated  Financial Strain Interventions Intervention Not Indicated  Physical Activity Interventions Intervention Not Indicated       Outpatient Encounter Medications as of 05/10/2020  Medication Sig  . blood glucose meter kit and supplies Dispense based on patient and insurance preference. Use up to four times daily as directed. (FOR ICD-10 E10.9, E11.9).  . CINNAMON PO Take 1 tablet by mouth daily.  [name] glucose blood (ACCU-CHEK GUIDE) test strip Use to check blood sugar twice daily  . metFORMIN (GLUCOPHAGE) 1000 MG tablet Start with 1000 mg by mouth daily for 7 days, then increase to 1000 mg by mouth 2x per day thereafter.  . rosuvastatin (CRESTOR) 10 MG tablet Take 1 tablet (10 mg total) by mouth daily.  . naproxen (NAPROSYN) 500 MG tablet Take 1 tablet (500 mg total) by mouth 2 (two) times daily with a meal. (Patient not taking: Reported on 03/15/2020)   No  facility-administered encounter medications on file as of 05/10/2020.     Objective:   Goals Addressed              This Visit's Progress     Patient Stated   .  "I want to keep my sugars well controlled" (pt-stated)        CARE PLAN ENTRY (see longtitudinal plan of care for additional care plan information)  Current Barriers:  . Social, community, and financial barriers:  o None noted at this time.  o Colonoscopy last week.  . Diabetes: uncontrolled but improved per SMBG results; most recent A1c 6.8% . Current antihyperglycemic regimen: metformin 1000 mg BID . Current blood glucose readings: o Post work (~6 hours after eating): 110 o 2 hour after meals: all <180 . Current meal patterns:  o Still focusing on cutting back on high carbohydrate meals. Reports that it took a bit to get things back regulated after his colonoscopy o Keeps boiled eggs, pecans/almonds, vegetables, zero sugar beef jerky, peanut butter  o 4 pm snack - small bag of chips  o Still has 1-2 small halloween candy servings, dove chocolates at night to satisfy sweet tooth; Bryers no sugar ice cream, Icees o Drinks: water, sugar free sparkling water  . Current exercise:  o Plays golf at least 3 days per week, has also started hunting (lots of walking, climbing) other days of the week. . Cardiovascular risk reduction: o Current hypertensive regimen: none o Current hyperlipidemia regimen: rosuvastatin 10 mg; 10 year ASCVD risk 5.1%; due for lipid panel at next appt   Pharmacist Clinical  Goal(s):  [name] Over the next 90 days, patient with work with PharmD and primary care provider to address optimized medication management  Interventions: . Comprehensive medication review performed, medication list updated in electronic medical record . Inter-disciplinary care team collaboration (see longitudinal plan of care) . Reviewed goal A1c, goal fasting glucose, and goal 2 hour post prandial glucose. Praised for meeting  goal A1c. Discussed importance of maintenance.  [name] Extensive dietary discussion. Reviewed options for low carbohydrate snacks.  . Reviewed indication for and benefits of statin therapy. Encouraged continued adherence to rosuvastatin. Will be due for repeat lipid panel w/ next lab work next month.   Patient Self Care Activities:  . Patient will check blood glucose regularly , document, and provide at future appointment . Patient will take medications as prescribed . Patient will report any questions or concerns to provider   Please see past updates related to this goal by clicking on the "Past Updates" button in the selected goal          Plan:  - Scheduled f/u call in ~ 12 weeks (~4-6 weeks after PCP f/u)  [name], PharmD, BCACP, [place] Pharmacist [place] [phone]

## 2020-05-10 NOTE — Patient Instructions (Signed)
Visit Information  Goals Addressed              This Visit's Progress     Patient Stated     "I want to keep my sugars well controlled" (pt-stated)        CARE PLAN ENTRY (see longtitudinal plan of care for additional care plan information)  Current Barriers:   Social, community, and financial barriers:  o None noted at this time.  o Colonoscopy last week. Notes it all went well  Most recent eGFR ~90 mL/min  Diabetes: controlled; most recent A1c 6.8%  Current antihyperglycemic regimen: metformin 1000 mg BID  Current blood glucose readings: o Post work (~6 hours after eating): 110 o 2 hour after meals: all <180  Current meal patterns:  o Still focusing on cutting back on high carbohydrate meals. Reports that it took a bit to get things back regulated after his colonoscopy o Keeps boiled eggs, pecans/almonds, vegetables, zero sugar beef jerky, peanut butter  o 4 pm snack - small bag of chips  o Still has 1-2 small halloween candy servings, dove chocolates at night to satisfy sweet tooth; Bryers no sugar ice cream, Icees o Drinks: water, sugar free sparkling water   Current exercise:  o Plays golf at least 3 days per week, has also started hunting (lots of walking, climbing) other days of the week.  Cardiovascular risk reduction: o Current hypertensive regimen: none o Current hyperlipidemia regimen: rosuvastatin 10 mg; 10 year ASCVD risk 5.1%; due for lipid panel at next appt   Pharmacist Clinical Goal(s):   Over the next 90 days, patient with work with PharmD and primary care provider to address optimized medication management  Interventions:  Comprehensive medication review performed, medication list updated in electronic medical record  Inter-disciplinary care team collaboration (see longitudinal plan of care)  Reviewed goal A1c, goal fasting glucose, and goal 2 hour post prandial glucose. Praised for meeting goal A1c. Discussed importance of maintenance.    Extensive dietary discussion. Reviewed options for low carbohydrate snacks.   Reviewed indication for and benefits of statin therapy. Encouraged continued adherence to rosuvastatin. Will be due for repeat lipid panel w/ next lab work next month.   Patient Self Care Activities:   Patient will check blood glucose regularly , document, and provide at future appointment  Patient will take medications as prescribed  Patient will report any questions or concerns to provider   Please see past updates related to this goal by clicking on the "Past Updates" button in the selected goal         The patient verbalized understanding of instructions provided today and declined a print copy of patient instruction materials.    Plan:  - Scheduled f/u call in ~ 12 weeks (~4-6 weeks after PCP f/u)  [name], PharmD, BCACP, [place] Pharmacist [place] [phone]

## 2020-05-11 NOTE — Progress Notes (Signed)
Agree with plan. Jeffre Enriques, NP  

## 2020-06-04 ENCOUNTER — Telehealth: Payer: Self-pay | Admitting: *Deleted

## 2020-06-04 ENCOUNTER — Other Ambulatory Visit: Payer: Self-pay | Admitting: Family

## 2020-06-04 DIAGNOSIS — E119 Type 2 diabetes mellitus without complications: Secondary | ICD-10-CM

## 2020-06-04 DIAGNOSIS — L989 Disorder of the skin and subcutaneous tissue, unspecified: Secondary | ICD-10-CM

## 2020-06-04 NOTE — Telephone Encounter (Signed)
Please place future orders for lab appt.  

## 2020-06-07 ENCOUNTER — Other Ambulatory Visit: Payer: Self-pay

## 2020-06-07 ENCOUNTER — Other Ambulatory Visit (INDEPENDENT_AMBULATORY_CARE_PROVIDER_SITE_OTHER): Payer: BC Managed Care – PPO

## 2020-06-07 DIAGNOSIS — E119 Type 2 diabetes mellitus without complications: Secondary | ICD-10-CM

## 2020-06-07 LAB — COMPREHENSIVE METABOLIC PANEL
ALT: 28 U/L (ref 0–53)
AST: 21 U/L (ref 0–37)
Albumin: 4.5 g/dL (ref 3.5–5.2)
Alkaline Phosphatase: 38 U/L — ABNORMAL LOW (ref 39–117)
BUN: 13 mg/dL (ref 6–23)
CO2: 26 mEq/L (ref 19–32)
Calcium: 9.3 mg/dL (ref 8.4–10.5)
Chloride: 102 mEq/L (ref 96–112)
Creatinine, Ser: 0.86 mg/dL (ref 0.40–1.50)
GFR: 100.97 mL/min (ref >60.00)
Glucose, Bld: 160 mg/dL — ABNORMAL HIGH (ref 70–99)
Potassium: 4.1 mEq/L (ref 3.5–5.1)
Sodium: 138 mEq/L (ref 135–145)
Total Bilirubin: 1.6 mg/dL — ABNORMAL HIGH (ref 0.2–1.2)
Total Protein: 7.5 g/dL (ref 6.0–8.3)

## 2020-06-07 LAB — LIPID PANEL
Cholesterol: 101 mg/dL (ref 0–200)
HDL: 35.3 mg/dL — ABNORMAL LOW (ref >39.00)
LDL Cholesterol: 42 mg/dL (ref 0–99)
NonHDL: 65.33
Total CHOL/HDL Ratio: 3
Triglycerides: 115 mg/dL (ref 0.0–149.0)
VLDL: 23 mg/dL (ref 0.0–40.0)

## 2020-06-07 LAB — HEMOGLOBIN A1C: Hgb A1c MFr Bld: 7.7 % — ABNORMAL HIGH (ref 4.6–6.5)

## 2020-06-09 ENCOUNTER — Other Ambulatory Visit: Payer: Self-pay | Admitting: Family

## 2020-06-09 DIAGNOSIS — R748 Abnormal levels of other serum enzymes: Secondary | ICD-10-CM

## 2020-06-14 NOTE — Progress Notes (Deleted)
Subjective:    Patient ID: Andrew Holloway., male    DOB: 16-Sep-1970, 49 y.o.   MRN: [id number]  CC: Andrew Holloway. is a 48 y.o. male who presents today for follow up.   HPI: Uncontrolled DM  Bilirubin elevated. Pending hepatic function panel ( collect  Today) [place] RUQ ordered     HISTORY:  Past Medical History:  Diagnosis Date  . Chicken pox   . Diabetes mellitus, type 2 ([place])   . Kidney stones   . Motion sickness    back seat of car  . Umbilical hernia 2/48/2500  . Wears contact lenses    Past Surgical History:  Procedure Laterality Date  . ANKLE SURGERY     x2  . COLONOSCOPY WITH PROPOFOL N/A 05/03/2020   Procedure: COLONOSCOPY WITH PROPOFOL;  Surgeon: Lucilla Lame, MD;  Location: [place];  Service: Endoscopy;  Laterality: N/A;  Diabetic - oral meds priority 4  . EPIGASTRIC HERNIA REPAIR N/A 03/30/2015   Procedure: HERNIA REPAIR EPIGASTRIC ADULT;  Surgeon: Robert Bellow, MD;  Location: [facility];  Service: General;  Laterality: N/A;  . HERNIA REPAIR  3704   umbilical hernia  . HERNIA REPAIR  03/30/2015   Epigastric and umbilical hernia with retrorectus 6.4 cm Ventralex mesh  . left and right ankle repair ligament    . nasal spectrum    . rotator cuff right    . SHOULDER SURGERY     rotator cuff   Family History  Problem Relation Age of Onset  . Heart disease Paternal Grandfather   . Hyperlipidemia Paternal Grandfather   . Heart disease Maternal Grandfather   . Hyperlipidemia Maternal Grandfather   . Diabetes Maternal Grandmother   . Hypertension Mother   . Hyperlipidemia Father     Allergies: Patient has no known allergies. Current Outpatient Medications on File Prior to Visit  Medication Sig Dispense Refill  . blood glucose meter kit and supplies Dispense based on patient and insurance preference. Use up to four times daily as directed. (FOR ICD-10 E10.9, E11.9). 1 each 0  . CINNAMON PO Take 1 tablet by mouth daily.    [name] glucose  blood (ACCU-CHEK GUIDE) test strip Use to check blood sugar twice daily 200 each 3  . metFORMIN (GLUCOPHAGE) 1000 MG tablet Start with 1000 mg by mouth daily for 7 days, then increase to 1000 mg by mouth 2x per day thereafter. 180 tablet 3  . naproxen (NAPROSYN) 500 MG tablet Take 1 tablet (500 mg total) by mouth 2 (two) times daily with a meal. (Patient not taking: Reported on 03/15/2020) 30 tablet 0  . rosuvastatin (CRESTOR) 10 MG tablet Take 1 tablet (10 mg total) by mouth daily. 90 tablet 3   No current facility-administered medications on file prior to visit.    Social History   Tobacco Use  . Smoking status: Never Smoker  . Smokeless tobacco: Never Used  Vaping Use  . Vaping Use: Never used  Substance Use Topics  . Alcohol use: Yes    Alcohol/week: 0.0 standard drinks    Comment: 2x month  . Drug use: No    Review of Systems    Objective:    There were no vitals taken for this visit. BP Readings from Last 3 Encounters:  05/03/20 112/77  03/05/20 112/74  02/22/20 138/89   Wt Readings from Last 3 Encounters:  05/03/20 197 lb (89.4 kg)  03/05/20 198 lb 9.6 oz (90.1 kg)  10/08/19  198 lb 12.8 oz (90.2 kg)    Physical Exam     Assessment & Plan:   Problem List Items Addressed This Visit    None       I am having Andrew Bushy. Ardito Jr. "[facility]" maintain his metFORMIN, blood glucose meter kit and supplies, Accu-Chek Guide, CINNAMON PO, naproxen, and rosuvastatin.   No orders of the defined types were placed in this encounter.   Return precautions given.   Risks, benefits, and alternatives of the medications and treatment plan prescribed today were discussed, and patient expressed understanding.   Education regarding symptom management and diagnosis given to patient on AVS.  Continue to follow with Burnard Hawthorne, FNP for routine health maintenance.   Andrew Bushy Delph Jr. and I agreed with plan.   [name], FNP

## 2020-06-16 ENCOUNTER — Other Ambulatory Visit: Payer: Self-pay

## 2020-06-18 ENCOUNTER — Ambulatory Visit: Payer: BC Managed Care – PPO | Admitting: Family

## 2020-06-22 ENCOUNTER — Other Ambulatory Visit: Payer: Self-pay

## 2020-06-22 ENCOUNTER — Telehealth: Payer: Self-pay | Admitting: Family

## 2020-06-22 DIAGNOSIS — E1165 Type 2 diabetes mellitus with hyperglycemia: Secondary | ICD-10-CM

## 2020-06-22 MED ORDER — METFORMIN HCL 1000 MG PO TABS: ORAL_TABLET | ORAL | 3 refills | Status: DC

## 2020-06-22 NOTE — Telephone Encounter (Signed)
Pt states he need a refill on metFORMIN (GLUCOPHAGE) 1000 MG tablet  Pharmacy is [id number] - [place], [place] - [address] OF [address]  Thank you!

## 2020-06-22 NOTE — Telephone Encounter (Signed)
I called patient & let him know refill sent to pharmacy.

## 2020-07-22 ENCOUNTER — Encounter: Payer: Self-pay | Admitting: Family

## 2020-07-23 ENCOUNTER — Ambulatory Visit (INDEPENDENT_AMBULATORY_CARE_PROVIDER_SITE_OTHER): Payer: BC Managed Care – PPO | Admitting: Family

## 2020-07-23 ENCOUNTER — Other Ambulatory Visit: Payer: Self-pay

## 2020-07-23 ENCOUNTER — Encounter: Payer: Self-pay | Admitting: Family

## 2020-07-23 VITALS — BP 126/80 | HR 83 | Temp 98.6°F | Ht 67.76 in | Wt 208.0 lb

## 2020-07-23 DIAGNOSIS — E785 Hyperlipidemia, unspecified: Secondary | ICD-10-CM

## 2020-07-23 DIAGNOSIS — R17 Unspecified jaundice: Secondary | ICD-10-CM

## 2020-07-23 DIAGNOSIS — E119 Type 2 diabetes mellitus without complications: Secondary | ICD-10-CM | POA: Diagnosis not present

## 2020-07-23 NOTE — Patient Instructions (Signed)
Nice to see you!   

## 2020-07-23 NOTE — Progress Notes (Signed)
 Subjective:    Patient ID: Andrew S Mcgeachy Jr., male    DOB: 07/06/1971, 49 y.o.   MRN: [id number]  CC: [name] is a 49 y.o. male who presents today for follow up.   HPI: Feels well today No complaints  DM- compliant with metformin 1000mg BID.  HLD- compliant with crestor 10mg  Declines US RUQ at this time.  H/o fatty liver disease suspected CT A/P 2013   Very rare alcohol use. No tylenol.    Colonoscopy UTD  HISTORY:  Past Medical History:  Diagnosis Date  . Chicken pox   . Diabetes mellitus, type 2 ([facility])   . Kidney stones   . Motion sickness    back seat of car  . Umbilical hernia 10/17/2011  . Wears contact lenses    Past Surgical History:  Procedure Laterality Date  . ANKLE SURGERY     x2  . COLONOSCOPY WITH PROPOFOL N/A 05/03/2020   Procedure: COLONOSCOPY WITH PROPOFOL;  Surgeon: Wohl, Darren, MD;  Location: [facility];  Service: Endoscopy;  Laterality: N/A;  Diabetic - oral meds priority 4  . EPIGASTRIC HERNIA REPAIR N/A 03/30/2015   Procedure: HERNIA REPAIR EPIGASTRIC ADULT;  Surgeon: Jeffrey W Byrnett, MD;  Location: [facility];  Service: General;  Laterality: N/A;  . HERNIA REPAIR  2014   umbilical hernia  . HERNIA REPAIR  03/30/2015   Epigastric and umbilical hernia with retrorectus 6.4 cm Ventralex mesh  . left and right ankle repair ligament    . nasal spectrum    . rotator cuff right    . SHOULDER SURGERY     rotator cuff   Family History  Problem Relation Age of Onset  . Heart disease Paternal Grandfather   . Hyperlipidemia Paternal Grandfather   . Heart disease Maternal Grandfather   . Hyperlipidemia Maternal Grandfather   . Diabetes Maternal Grandmother   . Hypertension Mother   . Hyperlipidemia Father     Allergies: Patient has no known allergies. Current Outpatient Medications on File Prior to Visit  Medication Sig Dispense Refill  . blood glucose meter kit and supplies Dispense based on patient and insurance  preference. Use up to four times daily as directed. (FOR ICD-10 E10.9, E11.9). 1 each 0  . CINNAMON PO Take 1 tablet by mouth daily.    . glucose blood (ACCU-CHEK GUIDE) test strip Use to check blood sugar twice daily 200 each 3  . metFORMIN (GLUCOPHAGE) 1000 MG tablet Start with 1000 mg by mouth daily for 7 days, then increase to 1000 mg by mouth 2x per day thereafter. 180 tablet 3  . rosuvastatin (CRESTOR) 10 MG tablet Take 1 tablet (10 mg total) by mouth daily. 90 tablet 3   No current facility-administered medications on file prior to visit.    Social History   Tobacco Use  . Smoking status: Never Smoker  . Smokeless tobacco: Never Used  Vaping Use  . Vaping Use: Never used  Substance Use Topics  . Alcohol use: Yes    Alcohol/week: 0.0 standard drinks    Comment: 2x month  . Drug use: No    Review of Systems  Constitutional: Negative for chills and fever.  Respiratory: Negative for cough.   Cardiovascular: Negative for chest pain and palpitations.  Gastrointestinal: Negative for nausea and vomiting.      Objective:    BP 126/80   Pulse 83   Temp 98.6 F (37 C)   Ht 5' 7.76" (1.721 m)     Wt 208 lb (94.3 kg)   SpO2 99%   BMI 31.85 kg/m  BP Readings from Last 3 Encounters:  07/23/20 126/80  05/03/20 112/77  03/05/20 112/74   Wt Readings from Last 3 Encounters:  07/23/20 208 lb (94.3 kg)  05/03/20 197 lb (89.4 kg)  03/05/20 198 lb 9.6 oz (90.1 kg)    Physical Exam Vitals reviewed.  Constitutional:      Appearance: He is well-developed and well-nourished.  Cardiovascular:     Rate and Rhythm: Regular rhythm.     Heart sounds: Normal heart sounds.  Pulmonary:     Effort: Pulmonary effort is normal. No respiratory distress.     Breath sounds: Normal breath sounds. No wheezing, rhonchi or rales.  Skin:    General: Skin is warm and dry.  Neurological:     Mental Status: He is alert.  Psychiatric:        Mood and Affect: Mood and affect normal.         Speech: Speech normal.        Behavior: Behavior normal.        Assessment & Plan:   Problem List Items Addressed This Visit      Endocrine   Diabetes mellitus without complication ([place])    Lab Results  Component Value Date   HGBA1C 7.7 (H) 06/07/2020   Uncontrolled. Advised to increase metformin to 1062m BID. No evidence  Of microalbuminuria, however will discuss low dose ARB at follow up.         Other   HLD (hyperlipidemia)    Stable. Continue crestor 114m      Other Visit Diagnoses    Elevated bilirubin    -  Primary   Relevant Orders   Hepatic function panel (Completed)       I am having Andrew BushyLoftis Jr. "[facility]" maintain his blood glucose meter kit and supplies, Accu-Chek Guide, CINNAMON PO, rosuvastatin, and metFORMIN.   No orders of the defined types were placed in this encounter.   Return precautions given.   Risks, benefits, and alternatives of the medications and treatment plan prescribed today were discussed, and patient expressed understanding.   Education regarding symptom management and diagnosis given to patient on AVS.  Continue to follow with ArBurnard HawthorneFNP for routine health maintenance.   Andrew Bushyoftis Jr. and I agreed with plan.   Andrew Holloway

## 2020-07-24 LAB — HEPATIC FUNCTION PANEL
AG Ratio: 1.5 (calc) (ref 1.0–2.5)
ALT: 30 U/L (ref 9–46)
AST: 22 U/L (ref 10–35)
Albumin: 4.4 g/dL (ref 3.6–5.1)
Alkaline phosphatase (APISO): 48 U/L (ref 35–144)
Bilirubin, Direct: 0.2 mg/dL (ref 0.0–0.2)
Globulin: 3 g/dL (calc) (ref 1.9–3.7)
Indirect Bilirubin: 0.7 mg/dL (calc) (ref 0.2–1.2)
Total Bilirubin: 0.9 mg/dL (ref 0.2–1.2)
Total Protein: 7.4 g/dL (ref 6.1–8.1)

## 2020-07-26 ENCOUNTER — Ambulatory Visit: Payer: BC Managed Care – PPO | Admitting: Pharmacist

## 2020-07-26 ENCOUNTER — Other Ambulatory Visit: Payer: Self-pay

## 2020-07-26 DIAGNOSIS — E119 Type 2 diabetes mellitus without complications: Secondary | ICD-10-CM

## 2020-07-26 DIAGNOSIS — E785 Hyperlipidemia, unspecified: Secondary | ICD-10-CM

## 2020-07-26 MED ORDER — ACCU-CHEK GUIDE VI STRP: ORAL_STRIP | 3 refills | Status: DC

## 2020-07-26 NOTE — Chronic Care Management (AMB) (Addendum)
Care Management   Pharmacy Note  07/26/2020 Name: Andrew Holloway. MRN: [id number] DOB: [birth year]  Andrew Holloway. is a 49 y.o. year old male who is a primary care patient of Burnard Hawthorne, FNP. The Care Management/Care Coordination team team was consulted for assistance with care management and care coordination needs.    Engaged with patient by telephone for follow up visit in response to provider referral for pharmacy case management and/or care coordination services.   Consent to Services:  [name] was given information about care management/care coordination services, agreed to services, and gave verbal consent prior to initiation of services. Please see initial visit note for detailed documentation.   Review of patient status, including review of consultants reports, laboratory and other test data, was performed as part of comprehensive evaluation and provision of chronic care management services.   SDOH (Social Determinants of Health) assessments and interventions performed: none today  SDOH Interventions   Flowsheet Row Most Recent Value  SDOH Interventions   SDOH Interventions for the Following Domains Physical Activity  Financial Strain Interventions Intervention Not Indicated  Physical Activity Interventions Intervention Not Indicated       Objective:  Lab Results  Component Value Date   CREATININE 0.86 06/07/2020   CREATININE 0.89 04/16/2020   CREATININE 0.87 02/16/2020    Lab Results  Component Value Date   HGBA1C 7.7 (H) 06/07/2020       Component Value Date/Time   CHOL 101 06/07/2020 0803   TRIG 115.0 06/07/2020 0803   HDL 35.30 (L) 06/07/2020 0803   CHOLHDL 3 06/07/2020 0803   VLDL 23.0 06/07/2020 0803   LDLCALC 42 06/07/2020 0803   LDLDIRECT 103.0 06/16/2019 1628     BP Readings from Last 3 Encounters:  07/23/20 126/80  05/03/20 112/77  03/05/20 112/74    Care Plan  No Known Allergies  Medications Reviewed Today     Reviewed by De Hollingshead, RPH-CPP (Pharmacist) on 07/26/20 at 0955  Med List Status: <None>  Medication Order Taking? Sig Documenting Provider Last Dose Status Informant  blood glucose meter kit and supplies [id number] Yes Dispense based on patient and insurance preference. Use up to four times daily as directed. (FOR ICD-10 E10.9, E11.9). [name], FNP Taking Active   CINNAMON PO [id number] Yes Take 1 tablet by mouth daily. [provider] Taking Active   glucose blood (ACCU-CHEK GUIDE) test strip [id number] Yes Use to check blood sugar twice daily Burnard Hawthorne, FNP Taking Active   metFORMIN (GLUCOPHAGE) 1000 MG tablet [id number] Yes Start with 1000 mg by mouth daily for 7 days, then increase to 1000 mg by mouth 2x per day thereafter. Burnard Hawthorne, FNP Taking Active   rosuvastatin (CRESTOR) 10 MG tablet [id number] Yes Take 1 tablet (10 mg total) by mouth daily. Burnard Hawthorne, FNP Taking Active           Patient Active Problem List   Diagnosis Date Noted  . Encounter for screening colonoscopy   . Skin lesion 03/05/2020  . Low back pain 10/08/2019  . HLD (hyperlipidemia) 08/06/2019  . Diabetes mellitus without complication ([place]) 40/76/8088  . Abscess of postoperative wound of abdominal wall 05/03/2015  . Recurrent umbilical hernia 06/09/1593  . Epigastric hernia 03/17/2015  . Obesity, unspecified 02/13/2013  . Skin lesion of scalp 02/13/2013  . Umbilical hernia 58/59/2924    Conditions to be addressed/monitored per PCP order: HLD and DMII  Patient Care Plan: Medication Management  Problem Identified: Diabetes, HLD     Long-Range Goal: Disease Progression Prevention   This Visit's Progress: On track  Priority: High  Note:   Current Barriers:  . Unable to maintain control of diabetes  Pharmacist Clinical Goal(s):  [name] Over the next 90 days, patient will achieve control of diabetes as evidenced by A1c  through collaboration with PharmD and  provider.   Interventions: . 1:1 collaboration with Burnard Hawthorne, FNP regarding development and update of comprehensive plan of care as evidenced by provider attestation and co-signature . Inter-disciplinary care team collaboration (see longitudinal plan of care) . Comprehensive medication review performed; medication list updated in electronic medical record  Diabetes: . Uncontrolled; current treatment: metformin 1000 mg BID  . Current glucose readings: post work (~ 6 hours after eating): ~110-120, post prandial glucose: 160-170s . Current meal patterns: reports that it took about a month after his colonoscopy for him to "get back in the groove" in regards to eating appropriately. Was eating too many carbs, too big of portion sizes. Needing to focus more on portion sizes. Would not eat much at work, but then would over indulge; Breakfast: 3 boiled eggs; snack of protein bar/shake; lunch: tuna salad/chicken salad; supper: smaller meal, leftovers, sometimes just protein; drinks: diet soda, water; Dessert: 2 squares of chocolate + milk;  . Current exercise: mostly deer hunting (walking in woods), some walking on golf course. Walks their dogs . Discussed next step would be SGLT2 vs GLP1. Agree to continue current dietary modification + physical activity w/ current metformin regimen at this time. If next A1c uncontrolled, recommend addition of second medication class based upon patient preference.  . Requests refill on test strips today. Will collaborate w/ PCP CMA to do this.   Hyperlipidemia: . Controlled; current treatment: rosuvastatin 10 mg daily  . Recommended to continue current regimen at this time  Patient Goals/Self-Care Activities . Over the next 90 days, patient will:  - take medications as prescribed check glucose BID, document, and provide at future appointments  Follow Up Plan: Telephone follow up appointment with care management team member scheduled for: ~8 weeks       Medication Assistance: None required. Patient affirms current coverage meets needs.   Plan: Telephone follow up appointment with care management team member scheduled for: ~ 8 weeks  [name], PharmD, [place], CPP Clinical Pharmacist [place] [facility]  I have collaborated with the care management provider regarding care management and care coordination activities outlined in this encounter and have reviewed this encounter including documentation in the note and care plan. I am certifying that I agree with the content of this note and encounter as primary care provider.   [name], NP

## 2020-07-26 NOTE — Progress Notes (Signed)
Thanks I have sent!

## 2020-07-26 NOTE — Assessment & Plan Note (Signed)
Stable.   - Continue crestor 10mg

## 2020-07-26 NOTE — Patient Instructions (Signed)
Visit Information  Patient Care Plan: Medication Management    Problem Identified: Diabetes, HLD     Long-Range Goal: Disease Progression Prevention   This Visit's Progress: On track  Priority: High  Note:   Current Barriers:  . Unable to maintain control of diabetes  Pharmacist Clinical Goal(s):  [name] Over the next 90 days, patient will achieve control of diabetes as evidenced by A1c  through collaboration with PharmD and provider.   Interventions: . 1:1 collaboration with Allegra Grana, FNP regarding development and update of comprehensive plan of care as evidenced by provider attestation and co-signature . Inter-disciplinary care team collaboration (see longitudinal plan of care) . Comprehensive medication review performed; medication list updated in electronic medical record  Diabetes: . Uncontrolled; current treatment: metformin 1000 mg BID  . Current glucose readings: post work (~ 6 hours after eating): ~110-120, post prandial glucose: 160-170s . Current meal patterns: reports that it took about a month after his colonoscopy for him to "get back in the groove" in regards to eating appropriately. Was eating too many carbs, too big of portion sizes. Needing to focus more on portion sizes. Would not eat much at work, but then would over indulge; Breakfast: 3 boiled eggs; snack of protein bar/shake; lunch: tuna salad/chicken salad; supper: smaller meal, leftovers, sometimes just protein; drinks: diet soda, water; Dessert: 2 squares of chocolate + milk;  . Current exercise: mostly deer hunting (walking in woods), some walking on golf course. Walks their dogs . Discussed next step would be SGLT2 vs GLP1. Agree to continue current dietary modification + physical activity w/ current metformin regimen at this time. If next A1c uncontrolled, recommend addition of second medication class based upon patient preference.  . Requests refill on test strips today. Will collaborate w/ PCP CMA to do  this.   Hyperlipidemia: . Controlled; current treatment: rosuvastatin 10 mg daily  . Recommended to continue current regimen at this time  Patient Goals/Self-Care Activities . Over the next 90 days, patient will:  - take medications as prescribed check glucose BID, document, and provide at future appointments  Follow Up Plan: Telephone follow up appointment with care management team member scheduled for: ~8 weeks       The patient verbalized understanding of instructions, educational materials, and care plan provided today and declined offer to receive copy of patient instructions, educational materials, and care plan.    Plan: Telephone follow up appointment with care management team member scheduled for: ~ 8 weeks  [name] Feliz Beam, PharmD, [place], CPP Clinical Pharmacist [facility] at [facility] [phone]

## 2020-07-26 NOTE — Assessment & Plan Note (Signed)
Lab Results  Component Value Date   HGBA1C 7.7 (H) 06/07/2020   Uncontrolled. Advised to increase metformin to 1000mg  BID. No evidence  Of microalbuminuria, however will discuss low dose ARB at follow up.

## 2020-08-03 ENCOUNTER — Telehealth: Payer: Self-pay | Admitting: Family

## 2020-08-03 NOTE — Telephone Encounter (Signed)
lft vm to follow up with pt about getting the US abdomen scheduled.

## 2020-08-09 NOTE — Telephone Encounter (Signed)
lft vm for pt to call ofc to follow up on US abdomen.

## 2020-08-24 DIAGNOSIS — Z1159 Encounter for screening for other viral diseases: Secondary | ICD-10-CM | POA: Diagnosis not present

## 2020-09-14 ENCOUNTER — Ambulatory Visit: Payer: BC Managed Care – PPO | Admitting: Pharmacist

## 2020-09-14 DIAGNOSIS — E119 Type 2 diabetes mellitus without complications: Secondary | ICD-10-CM

## 2020-09-14 DIAGNOSIS — E785 Hyperlipidemia, unspecified: Secondary | ICD-10-CM

## 2020-09-14 NOTE — Patient Instructions (Signed)
Visit Information  Goals Addressed              This Visit's Progress     Patient Stated   .  Medication Monitoring (pt-stated)        Patient Goals/Self-Care Activities . Over the next 90 days, patient will:  - take medications as prescribed check glucose BID, document, and provide at future appointments target a minimum of 150 minutes of moderate intensity exercise weekly       Patient verbalizes understanding of instructions provided today and agrees to view in MyChart.  Plan: Telephone follow up appointment with care management team member scheduled for:  ~ 8 weeks  [name] Feliz Beam, PharmD, [place], CPP Clinical Pharmacist [facility] at [employer] [phone]

## 2020-09-14 NOTE — Chronic Care Management (AMB) (Addendum)
Care Management   Pharmacy Note  09/14/2020 Name: Andrew Holloway. MRN: [id number] DOB: 28-Jun-1971  Subjective: Andrew Holloway. is a 50 y.o. year old male who is a primary care patient of Burnard Hawthorne, FNP. The Care Management team was consulted for assistance with care management and care coordination needs.    Engaged with patient by telephone for follow up visit in response to provider referral for pharmacy case management and/or care coordination services.   The patient was given information about Care Management services today including:  1. Care Management services includes personalized support from designated clinical staff supervised by the patient's primary care provider, including individualized plan of care and coordination with other care providers. 2. 24/7 contact phone numbers for assistance for urgent and routine care needs. 3. The patient may stop case management services at any time by phone call to the office staff.  Patient agreed to services and consent obtained.  Assessment:  Review of patient status, including review of consultants reports, laboratory and other test data, was performed as part of comprehensive evaluation and provision of chronic care management services.   SDOH (Social Determinants of Health) assessments and interventions performed:  SDOH Interventions   Flowsheet Row Most Recent Value  SDOH Interventions   Financial Strain Interventions Intervention Not Indicated  Physical Activity Interventions Intervention Not Indicated       Objective:  Lab Results  Component Value Date   CREATININE 0.86 06/07/2020   CREATININE 0.89 04/16/2020   CREATININE 0.87 02/16/2020    Lab Results  Component Value Date   HGBA1C 7.7 (H) 06/07/2020       Component Value Date/Time   CHOL 101 06/07/2020 0803   TRIG 115.0 06/07/2020 0803   HDL 35.30 (L) 06/07/2020 0803   CHOLHDL 3 06/07/2020 0803   VLDL 23.0 06/07/2020 0803   LDLCALC 42  06/07/2020 0803   LDLDIRECT 103.0 06/16/2019 1628   Clinical ASCVD: No  The ASCVD Risk score (Goff DC Jr., et al., 2013) failed to calculate for the following reasons:   The valid total cholesterol range is 130 to 320 mg/dL    BP Readings from Last 3 Encounters:  07/23/20 126/80  05/03/20 112/77  03/05/20 112/74    Care Plan  No Known Allergies  Medications Reviewed Today    Reviewed by De Hollingshead, RPH-CPP (Pharmacist) on 09/14/20 at 0907  Med List Status: <None>  Medication Order Taking? Sig Documenting Provider Last Dose Status Informant  blood glucose meter kit and supplies [id number] Yes Dispense based on patient and insurance preference. Use up to four times daily as directed. (FOR ICD-10 E10.9, E11.9). [name], FNP Taking Active   CINNAMON PO [id number] Yes Take 1 tablet by mouth daily. [provider] Taking Active   glucose blood (ACCU-CHEK GUIDE) test strip [id number] Yes Use to check blood sugar twice daily Burnard Hawthorne, FNP Taking Active   metFORMIN (GLUCOPHAGE) 1000 MG tablet [id number] Yes Start with 1000 mg by mouth daily for 7 days, then increase to 1000 mg by mouth 2x per day thereafter. Burnard Hawthorne, FNP Taking Active   rosuvastatin (CRESTOR) 10 MG tablet [id number] Yes Take 1 tablet (10 mg total) by mouth daily. Burnard Hawthorne, FNP Taking Active           Patient Active Problem List   Diagnosis Date Noted  . Encounter for screening colonoscopy   . Skin lesion 03/05/2020  . Low back pain 10/08/2019  . HLD (  hyperlipidemia) 08/06/2019  . Diabetes mellitus without complication ([place]) 50/53/9767  . Abscess of postoperative wound of abdominal wall 05/03/2015  . Recurrent umbilical hernia 34/19/3790  . Epigastric hernia 03/17/2015  . Obesity, unspecified 02/13/2013  . Skin lesion of scalp 02/13/2013  . Umbilical hernia 24/04/7352    Conditions to be addressed/monitored: HLD and DMII  Care Plan : Medication Management   Updates made by De Hollingshead, RPH-CPP since 09/14/2020 12:00 AM    Problem: Diabetes, HLD     Long-Range Goal: Disease Progression Prevention   This Visit's Progress: On track  Recent Progress: On track  Priority: High  Note:   Current Barriers:  . Unable to maintain control of diabetes  Pharmacist Clinical Goal(s):  [name] Over the next 90 days, patient will achieve control of diabetes as evidenced by A1c  through collaboration with PharmD and provider.   Interventions: . 1:1 collaboration with Burnard Hawthorne, FNP regarding development and update of comprehensive plan of care as evidenced by provider attestation and co-signature . Inter-disciplinary care team collaboration (see longitudinal plan of care) . Comprehensive medication review performed; medication list updated in electronic medical record  Health Maintenance: . Reports recent COVID diagnosis. Notes he had a headache, low grade fever for ~2 days. Lingering occasional fatigue. Overall improving. Asks if he needs booster. Reviewed that he should pursue booster dose at least 90 days after COVID diagnosis.   Diabetes: . Uncontrolled; current treatment: metformin 1000 mg BID  . Current glucose readings: fasting: 160s; post work out ~ 100s; usually <180 but occasionally >200 if he over eats w/ bigger portions  . Current meal patterns: notes lately he may go a while in the day without eating, and then will overeat at the end of the day, resulting in higher bedtime glucose. Notes he is working on that.  . Current exercise: has not been back to the gym since having COVID, but plans to get back into it. Has liked using the rowing machine, some weight lifting. Also has an exercise bike that he has been riding, working on range of motion w/ his ankle.  . Discussed regular meals to prevent overeating.  . Discussed Dawn Phenomenon as cause for higher fasting glucose than bedtime glucose.  . Encouraged to get back into regular  activity at the gym. Eventual goal of 150 minutes of moderate intensity exercise weekly.  . F/u with PCP next month w/ lab work. If A1c not at goal, recommend discussion of adding SGLT2 vs GLP1  Hyperlipidemia: . Controlled per last lipid panel; current treatment: rosuvastatin 10 mg daily  . Recommended to continue current regimen at this time  Patient Goals/Self-Care Activities . Over the next 90 days, patient will:  - take medications as prescribed check glucose BID, document, and provide at future appointments target a minimum of 150 minutes of moderate intensity exercise weekly  Follow Up Plan: Telephone follow up appointment with care management team member scheduled for: ~8 weeks      Medication Assistance:  None required.  Patient affirms current coverage meets needs.  Follow Up:  Patient agrees to Care Plan and Follow-up.  Plan: Telephone follow up appointment with care management team member scheduled for:  ~ 8 weeks  [name], PharmD, [place], CPP Clinical Pharmacist [place] at [facility]  I have collaborated with the care management provider regarding care management and care coordination activities outlined in this encounter and have reviewed this encounter including documentation in the note and care plan.  I am certifying that I agree with the content of this note and encounter as primary care provider.   [name], NP

## 2020-10-22 ENCOUNTER — Encounter: Payer: Self-pay | Admitting: Family

## 2020-10-22 ENCOUNTER — Other Ambulatory Visit: Payer: Self-pay

## 2020-10-22 ENCOUNTER — Ambulatory Visit (INDEPENDENT_AMBULATORY_CARE_PROVIDER_SITE_OTHER): Payer: BC Managed Care – PPO | Admitting: Family

## 2020-10-22 VITALS — BP 124/90 | HR 100 | Temp 98.1°F | Wt 205.2 lb

## 2020-10-22 DIAGNOSIS — E785 Hyperlipidemia, unspecified: Secondary | ICD-10-CM

## 2020-10-22 DIAGNOSIS — E119 Type 2 diabetes mellitus without complications: Secondary | ICD-10-CM | POA: Diagnosis not present

## 2020-10-22 DIAGNOSIS — E1165 Type 2 diabetes mellitus with hyperglycemia: Secondary | ICD-10-CM | POA: Diagnosis not present

## 2020-10-22 LAB — POCT GLYCOSYLATED HEMOGLOBIN (HGB A1C): Hemoglobin A1C: 8.9 % — AB (ref 4.0–5.6)

## 2020-10-22 MED ORDER — METFORMIN HCL 1000 MG PO TABS: 1000.0000 mg | ORAL_TABLET | Freq: Two times a day (BID) | ORAL | 3 refills | Status: DC

## 2020-10-22 NOTE — Patient Instructions (Signed)
Increase metformin to 1000mg  twice per day  This is  Dr.  example of a  "Low GI"  Diet:  It will allow you to lose 4 to 8  lbs  per month if you follow it carefully.  Your goal with exercise is a minimum of 30 minutes of aerobic exercise 5 days per week (Walking does not count once it becomes easy!)    All of the foods can be found at grocery stores and in bulk at [employer].  The Atkins protein bars and shakes are available in more varieties at Target, WalMart and Lowe's Foods.     7 AM Breakfast:  Choose from the following:  Low carbohydrate Protein  Shakes (I recommend the  Premier Protein chocolate shakes,  EAS AdvantEdge "Carb Control" shakes  Or the Atkins shakes all are under 3 net carbs)     a scrambled egg/bacon/cheese burrito made with Mission's "carb balance" whole wheat tortilla  (about 10 net carbs )  Rohm and Haas (basically a quiche without the pastry crust) that is eaten cold and very convenient way to get your eggs.  8 carbs)  If you make your own protein shakes, avoid bananas and pineapple,  And use low carb greek yogurt or original /unsweetened almond or soy milk    Avoid cereal and bananas, oatmeal and cream of wheat and grits. They are loaded with carbohydrates!   10 AM: high protein snack:  Protein bar by Atkins (the snack size, under 200 cal, usually < 6 net carbs).    A stick of cheese:  Around 1 carb,  100 cal     Dannon Light n Fit Medical laboratory scientific officer Yogurt  (80 cal, 8 carbs)  Other so called "protein bars" and Greek yogurts tend to be loaded with carbohydrates.  Remember, in food advertising, the word "energy" is synonymous for " carbohydrate."  Lunch:   A Sandwich using the bread choices listed, Can use any  Eggs,  lunchmeat, grilled meat or canned tuna), avocado, regular mayo/mustard  and cheese.  A Salad using blue cheese, ranch,  Goddess or vinagrette,  Avoid taco shells, croutons or "confetti" and no "candied nuts" but regular nuts OK.    No pretzels, nabs  or chips.  Pickles and miniature sweet peppers are a good low carb alternative that provide a "crunch"  The bread is the only source of carbohydrate in a sandwich and  can be decreased by trying some of the attached alternatives to traditional loaf bread   Avoid "Low fat dressings, as well as [place] and Reyne Dumas dressings They are loaded with sugar!   3 PM/ Mid day  Snack:  Consider  1 ounce of  almonds, walnuts, pistachios, pecans, peanuts,  Macadamia nuts or a nut medley.  Avoid "granola and granola bars "  Mixed nuts are ok in moderation as long as there are no raisins,  cranberries or dried fruit.   KIND bars are OK if you get the low glycemic index variety   Try the prosciutto/mozzarella cheese sticks by Fiorruci  In deli /backery section   High protein      6 PM  Dinner:     Meat/fowl/fish with a green salad, and either broccoli, cauliflower, green beans, spinach, brussel sprouts or  Lima beans. DO NOT BREAD THE PROTEIN!!      There is a low carb pasta by Dreamfield's that is acceptable and tastes great: only 5 digestible carbs/serving.( All grocery stores but BJs  carry it ) Several ready made meals are available low carb:   Try Michel Angelo's chicken piccata or chicken or eggplant parm over low carb pasta.(Lowes and BJs)   Clifton Custard Sanchez's "Carnitas" (pulled pork, no sauce,  0 carbs) or his beef pot roast to make a dinner burrito (at [employer]J's)  Pesto over low carb pasta (bj's sells a good quality pesto in the center refrigerated section of the deli   Try satueeing  Roosvelt Harps with mushroooms as a good side   Green Giant makes a mashed cauliflower that tastes like mashed potatoes  Whole wheat pasta is still full of digestible carbs and  Not as low in glycemic index as Dreamfield's.   Brown rice is still rice,  So skip the rice and noodles if you eat [place] or [place] (or at least limit to 1/2 cup)  9 PM snack :   Breyer's "low carb" fudgsicle or  ice cream bar  (Carb Smart line), or  Weight Watcher's ice cream bar , or another "no sugar added" ice cream;  a serving of fresh berries/cherries with whipped cream   Cheese or DANNON'S LlGHT N FIT GREEK YOGURT  8 ounces of [place] unsweetened almond/cococunut milk    Treat yourself to a parfait made with whipped cream blueberiies, walnuts and vanilla greek yogurt  Avoid bananas, pineapple, grapes  and watermelon on a regular basis because they are high in sugar.  THINK OF THEM AS DESSERT  Remember that snack Substitutions should be less than 10 NET carbs per serving and meals < 20 carbs. Remember to subtract fiber grams to get the "net carbs."

## 2020-10-22 NOTE — Assessment & Plan Note (Signed)
Lab Results  Component Value Date   HGBA1C 8.9 (A) 10/22/2020  uncontrolled. Advised to increase metformin to 2000mg /day from 1000mg /dayas well as consideration for GLP 1 agonist. He would like to aggressively work on dietary changes as in the past he has been able to make significant progress. He will consider increasing metformin  And let me know what he decides.

## 2020-10-22 NOTE — Progress Notes (Signed)
Subjective:    Patient ID: Andrew Loma., male    DOB: [birth year], 50 y.o.   MRN: [id number]  CC: Andrew Dome Mclucas Brooke Bonito. is a 50 y.o. male who presents today for follow up.   HPI: Feels well today.   Endorses eating larger quantities and eating more often . His FBG have increased from < 110 to 145-150.  compliant with metformin 1064m QD    HLD- compliant with crestor 135m      HISTORY:  Past Medical History:  Diagnosis Date  . Chicken pox   . [place] 2 [place]  . Kidney stones   . Motion sickness    back seat of car  . Umbilical hernia 10/06/61/7858. Wears contact lenses    Past Surgical History:  Procedure Laterality Date  . ANKLE SURGERY     x2  . COLONOSCOPY WITH PROPOFOL N/A 05/03/2020   Procedure: COLONOSCOPY WITH PROPOFOL;  Surgeon: WoLucilla LameMD;  [facility] Service: Endoscopy;  Laterality: N/A;  Diabetic - oral meds priority 4  . EPIGASTRIC HERNIA REPAIR N/A 03/30/2015   Procedure: HERNIA REPAIR EPIGASTRIC ADULT;  Surgeon: JeRobert BellowMD;  Location: [facility];  Service: General;  Laterality: N/A;  . HERNIA REPAIR  [id number] umbilical hernia  . HERNIA REPAIR  03/30/2015   Epigastric and umbilical hernia with retrorectus 6.4 cm Ventralex mesh  . left and right ankle repair ligament    . nasal spectrum    . rotator cuff right    . SHOULDER SURGERY     rotator cuff   Family History  Problem Relation Age of Onset  . Heart disease Paternal Grandfather   . Hyperlipidemia Paternal Grandfather   . Heart disease Maternal Grandfather   . Hyperlipidemia Maternal Grandfather   . Diabetes Maternal Grandmother   . Hypertension Mother   . Hyperlipidemia Father     Allergies: Patient has no known allergies. Current Outpatient Medications on File Prior to Visit  Medication Sig Dispense Refill  . blood glucose meter kit and supplies Dispense based on patient and insurance preference. Use up to four times daily as  directed. (FOR ICD-10 E10.9, E11.9). 1 each 0  . CINNAMON PO Take 1 tablet by mouth daily.    . Marland Kitchenlucose blood (ACCU-CHEK GUIDE) test strip Use to check blood sugar twice daily 200 each 3  . rosuvastatin (CRESTOR) 10 MG tablet Take 1 tablet (10 mg total) by mouth daily. 90 tablet 3   No current facility-administered medications on file prior to visit.    Social History   Tobacco Use  . Smoking status: Never Smoker  . Smokeless tobacco: Never Used  Vaping Use  . Vaping Use: Never used  Substance Use Topics  . Alcohol use: Yes    Alcohol/week: 0.0 standard drinks    Comment: 2x month  . Drug use: No    Review of Systems  Constitutional: Negative for chills and fever.  Respiratory: Negative for cough.   Cardiovascular: Negative for chest pain and palpitations.  Gastrointestinal: Negative for nausea and vomiting.      Objective:    BP 124/90   Pulse 100   Temp 98.1 F (36.7 C)   Wt 205 lb 3.2 oz (93.1 kg)   SpO2 99%   BMI 31.42 kg/m  BP Readings from Last 3 Encounters:  10/22/20 124/90  07/23/20 126/80  05/03/20 112/77   Wt Readings from Last 3 Encounters:  10/22/20 205 lb 3.2 oz (93.1 kg)  07/23/20 208 lb (94.3 kg)  05/03/20 197 lb (89.4 kg)    Physical Exam     Assessment & Plan:   Problem List Items Addressed This Visit      Endocrine   Diabetes mellitus without complication ([place]) - Primary    Lab Results  Component Value Date   HGBA1C 8.9 (A) 10/22/2020  uncontrolled. Advised to increase metformin to 202m/day from 10063mdayas well as consideration for GLP 1 agonist. He would like to aggressively work on dietary changes as in the past he has been able to make significant progress. He will consider increasing metformin  And let me know what he decides.        Relevant Medications   metFORMIN (GLUCOPHAGE) 1000 MG tablet   Other Relevant Orders   POCT HgB A1C (Completed)     Other   HLD (hyperlipidemia)    Excellent control. Continue crestor  1053m     Other Visit Diagnoses    Type 2 diabetes mellitus with hyperglycemia, without long-term current use of insulin (HCC)       Relevant Medications   metFORMIN (GLUCOPHAGE) 1000 MG tablet       I have changed Andrew Bushyoftis Jr. "SONNY"'s metFORMIN. I am also having him maintain his blood glucose meter kit and supplies, CINNAMON PO, rosuvastatin, and Accu-Chek Guide.   Meds ordered this encounter  Medications  . metFORMIN (GLUCOPHAGE) 1000 MG tablet    Sig: Take 1 tablet (1,000 mg total) by mouth 2 (two) times daily with a meal.    Dispense:  180 tablet    Refill:  3    Order Specific Question:   Supervising Provider    Answer:   TULCrecencio [id number]]    Return precautions given.   Risks, benefits, and alternatives of the medications and treatment plan prescribed today were discussed, and patient expressed understanding.   Education regarding symptom management and diagnosis given to patient on AVS.  Continue to follow with ArnBurnard HawthorneNP for routine health maintenance.   Andrew Bushyftis Jr. and I agreed with plan.   MarMable ParisNP

## 2020-10-22 NOTE — Assessment & Plan Note (Signed)
Excellent control. Continue crestor 10mg 

## 2020-11-23 ENCOUNTER — Telehealth: Payer: BC Managed Care – PPO

## 2020-11-30 ENCOUNTER — Ambulatory Visit: Payer: BC Managed Care – PPO | Admitting: Pharmacist

## 2020-11-30 DIAGNOSIS — E785 Hyperlipidemia, unspecified: Secondary | ICD-10-CM

## 2020-11-30 DIAGNOSIS — E1165 Type 2 diabetes mellitus with hyperglycemia: Secondary | ICD-10-CM

## 2020-11-30 NOTE — Patient Instructions (Signed)
Visit Information  Goals Addressed              This Visit's Progress     Patient Stated   .  Medication Monitoring (pt-stated)        Patient Goals/Self-Care Activities . Over the next 90 days, patient will:  - take medications as prescribed check glucose BID, document, and provide at future appointments target a minimum of 150 minutes of moderate intensity exercise weekly        Patient verbalizes understanding of instructions provided today and agrees to view in MyChart.   Plan: Face to Face appointment with care management team member scheduled for: ~ 8 weeks w/ PCP visit  [name] Feliz Beam, PharmD, [place], CPP Clinical Pharmacist [employer] at [facility] [phone]

## 2020-11-30 NOTE — Chronic Care Management (AMB) (Addendum)
Care Management   Pharmacy Note  11/30/2020 Name: Andrew Holloway. MRN: [id number] DOB: 1970-09-20  Subjective: Andrew Holloway Andrew Holloway. is a 50 y.o. year old male who is a primary care patient of Burnard Hawthorne, FNP. The Care Management team was consulted for assistance with care management and care coordination needs.    Engaged with patient by telephone for follow up visit in response to provider referral for pharmacy case management and/or care coordination services.   The patient was given information about Care Management services today including:  1. Care Management services includes personalized support from designated clinical staff supervised by the patient's primary care provider, including individualized plan of care and coordination with other care providers. 2. 24/7 contact phone numbers for assistance for urgent and routine care needs. 3. The patient may stop case management services at any time by phone call to the office staff.  Patient agreed to services and consent obtained.  Assessment:  Review of patient status, including review of consultants reports, laboratory and other test data, was performed as part of comprehensive evaluation and provision of chronic care management services.   SDOH (Social Determinants of Health) assessments and interventions performed:    Objective:  Lab Results  Component Value Date   CREATININE 0.86 06/07/2020   CREATININE 0.89 04/16/2020   CREATININE 0.87 02/16/2020    Lab Results  Component Value Date   HGBA1C 8.9 (A) 10/22/2020       Component Value Date/Time   CHOL 101 06/07/2020 0803   TRIG 115.0 06/07/2020 0803   HDL 35.30 (L) 06/07/2020 0803   CHOLHDL 3 06/07/2020 0803   VLDL 23.0 06/07/2020 0803   LDLCALC 42 06/07/2020 0803   LDLDIRECT 103.0 06/16/2019 1628    Clinical ASCVD: No  The ASCVD Risk score (Goff DC Jr., et al., 2013) failed to calculate for the following reasons:   The valid total cholesterol range  is 130 to 320 mg/dL    BP Readings from Last 3 Encounters:  10/22/20 124/90  07/23/20 126/80  05/03/20 112/77    Care Plan  No Known Allergies  Medications Reviewed Today    Reviewed by De Hollingshead, RPH-CPP (Pharmacist) on 11/30/20 at 27  Med List Status: <None>  Medication Order Taking? Sig Documenting Provider Last Dose Status Informant  blood glucose meter kit and supplies [id number] Yes Dispense based on patient and insurance preference. Use up to four times daily as directed. (FOR ICD-10 E10.9, E11.9). [name], FNP Taking Active   CINNAMON PO [id number] Yes Take 1 tablet by mouth daily. [provider] Taking Active   glucose blood (ACCU-CHEK GUIDE) test strip [id number]  Use to check blood sugar twice daily Burnard Hawthorne, FNP  Active   metFORMIN (GLUCOPHAGE) 1000 MG tablet [id number] Yes Take 1 tablet (1,000 mg total) by mouth 2 (two) times daily with a meal. Burnard Hawthorne, FNP Taking Active   rosuvastatin (CRESTOR) 10 MG tablet [id number] Yes Take 1 tablet (10 mg total) by mouth daily. Burnard Hawthorne, FNP Taking Active           Patient Active Problem List   Diagnosis Date Noted  . Encounter for screening colonoscopy   . Skin lesion 03/05/2020  . Low back pain 10/08/2019  . HLD (hyperlipidemia) 08/06/2019  . Diabetes mellitus without complication ([place]) 17/00/1749  . Abscess of postoperative wound of abdominal wall 05/03/2015  . Recurrent umbilical hernia 44/96/7591  . Epigastric hernia 03/17/2015  . Obesity, unspecified 02/13/2013  .  Skin lesion of scalp 02/13/2013  . Umbilical hernia 09/81/1914    Conditions to be addressed/monitored: DMII  Care Plan : Medication Management  Updates made by De Hollingshead, RPH-CPP since 11/30/2020 12:00 AM    Problem: Diabetes, HLD     Long-Range Goal: Disease Progression Prevention   This Visit's Progress: On track  Recent Progress: On track  Priority: High  Note:   Current Barriers:   . Unable to maintain control of diabetes  Pharmacist Clinical Goal(s):  [name] Over the next 90 days, patient will achieve control of diabetes as evidenced by A1c  through collaboration with PharmD and provider.   Interventions: . 1:1 collaboration with Burnard Hawthorne, FNP regarding development and update of comprehensive plan of care as evidenced by provider attestation and co-signature . Inter-disciplinary care team collaboration (see longitudinal plan of care) . Comprehensive medication review performed; medication list updated in electronic medical record  Diabetes: . Uncontrolled; current treatment: metformin 1000 mg BID - reports a miscommunication and increased metformin to 1 tablet three times daily  . Current glucose readings: fasting: 140-160s; later in the day can be up to 190-210 after meals; down to 110 after exercise . Current meal patterns: working 6 a- 4 p; breakfast: sometimes yogurt, eggs; if really hungry, will add in a bag of tuna; lunch: sometimes burger, sometimes pack of cracker; supper: lean protein, salad; sometimes will just eat a thing of tuna for supper if he gets home late and doesn't feel like eating a lot; drinks: diet soda, water; snacks: strawberries, blueberries, sometimes dessert: fruit + cool whip on top; healthy choices fudge bars. Has cut back on alcohol.  . Current exercise: has not been back to the gym since having COVID, wife is still having post COVID fatigue. Work has also been busy lately. They have started playing golf again, walking at his father's land.  . Praised for good meal choices. Continue focus on lean proteins, fruits, vegetables, higher fibers. Focus on hydration. . Encouraged eventual goal of 150 minutes of moderate intensity exercise weekly.  . Advised that 3000 mg of metformin is over the daily recommended dose. Reduce to 1000 mg BID. Patient verbalized understanding.  [name] Extensive education on goal A1c, goal fasting, and goal 2 hour post  prandial glucose readings. . Extensive education on GLP1 (Ozempic, Trulicity, Rybelsus) vs SGLT2 (Jardiance, [place]). Patient not interested in adding pharmacotherapy at this time. Prefers to work on increasing physical activity. Strongly advise that if next A1c is not at goal that a second agent be added to metformin.   Hyperlipidemia: . Controlled per last lipid panel; current treatment: rosuvastatin 10 mg daily  . Recommended to continue current regimen at this time  Patient Goals/Self-Care Activities . Over the next 90 days, patient will:  - take medications as prescribed check glucose twice daily, document, and provide at future appointments target a minimum of 150 minutes of moderate intensity exercise weekly  Follow Up Plan: Face to Face appointment with care management team member scheduled for:  ~8 weeks w/ PCP visit      Medication Assistance:  None required.  Patient affirms current coverage meets needs.  Follow Up:  Patient agrees to Care Plan and Follow-up.  Plan: Face to Face appointment with care management team member scheduled for: ~ 8 weeks w/ PCP visit  [name], PharmD, [place], CPP Clinical Pharmacist [place] [facility] [place]  I have collaborated with the care management provider regarding care management and care coordination  activities outlined in this encounter and have reviewed this encounter including documentation in the note and care plan. I am certifying that I agree with the content of this note and encounter as supervising physician.   Margaret arnett, np

## 2021-01-18 IMAGING — DX DG WRIST COMPLETE 3+V*R*
4 series · 4 of 4 positions shown · non-contrast
Comparison: None.

CLINICAL DATA: Right wrist pain after fall at work.

EXAM:
RIGHT WRIST - COMPLETE 3+ VIEW

[wrist pa]
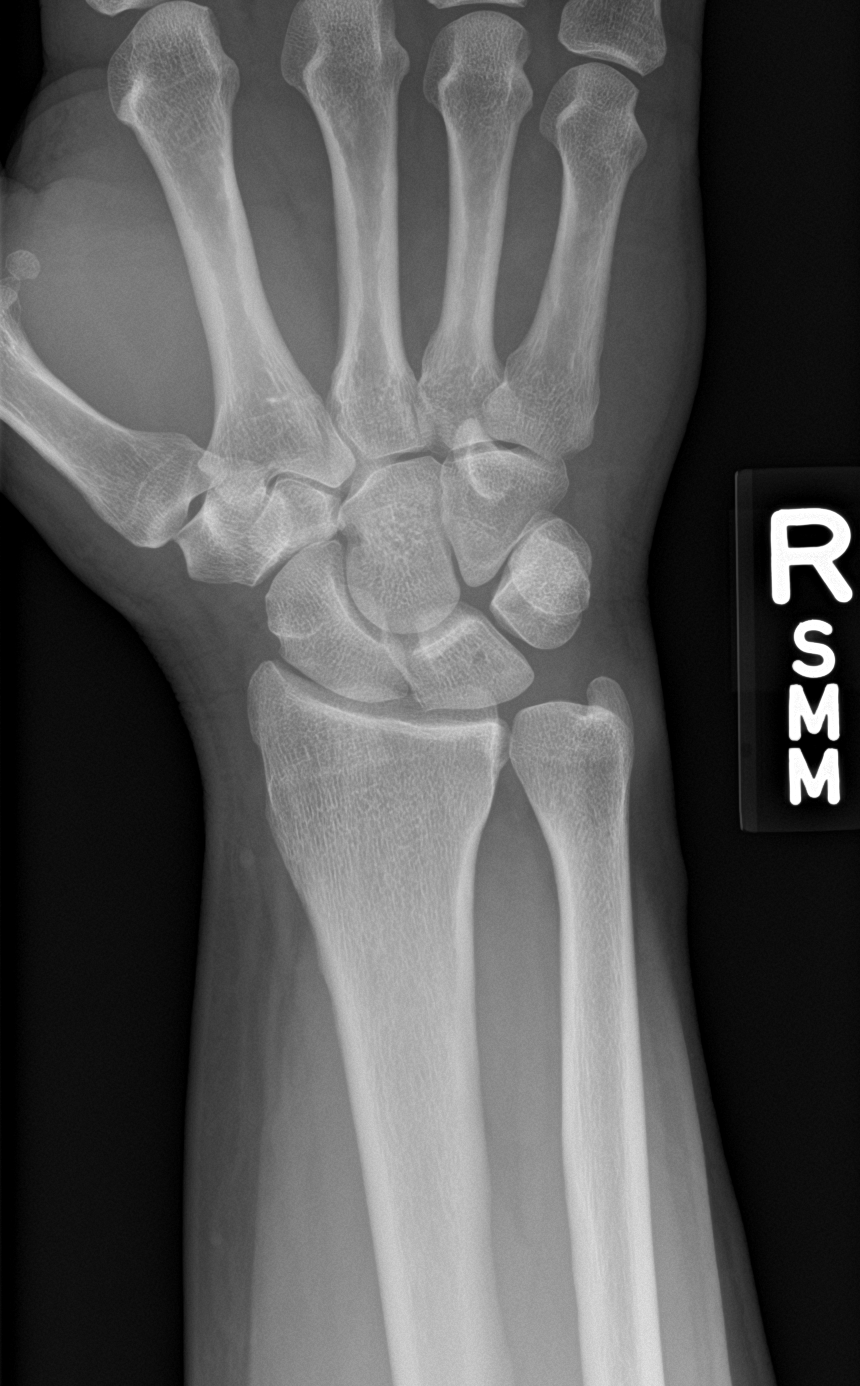

[wrist navicular]
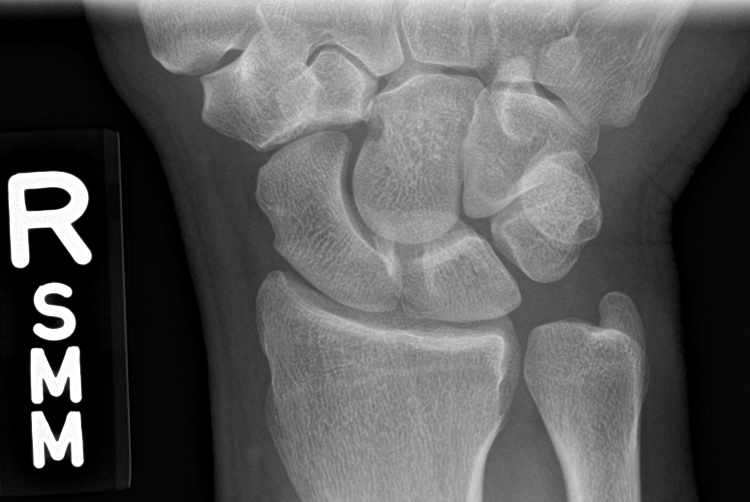

[wrist obl]
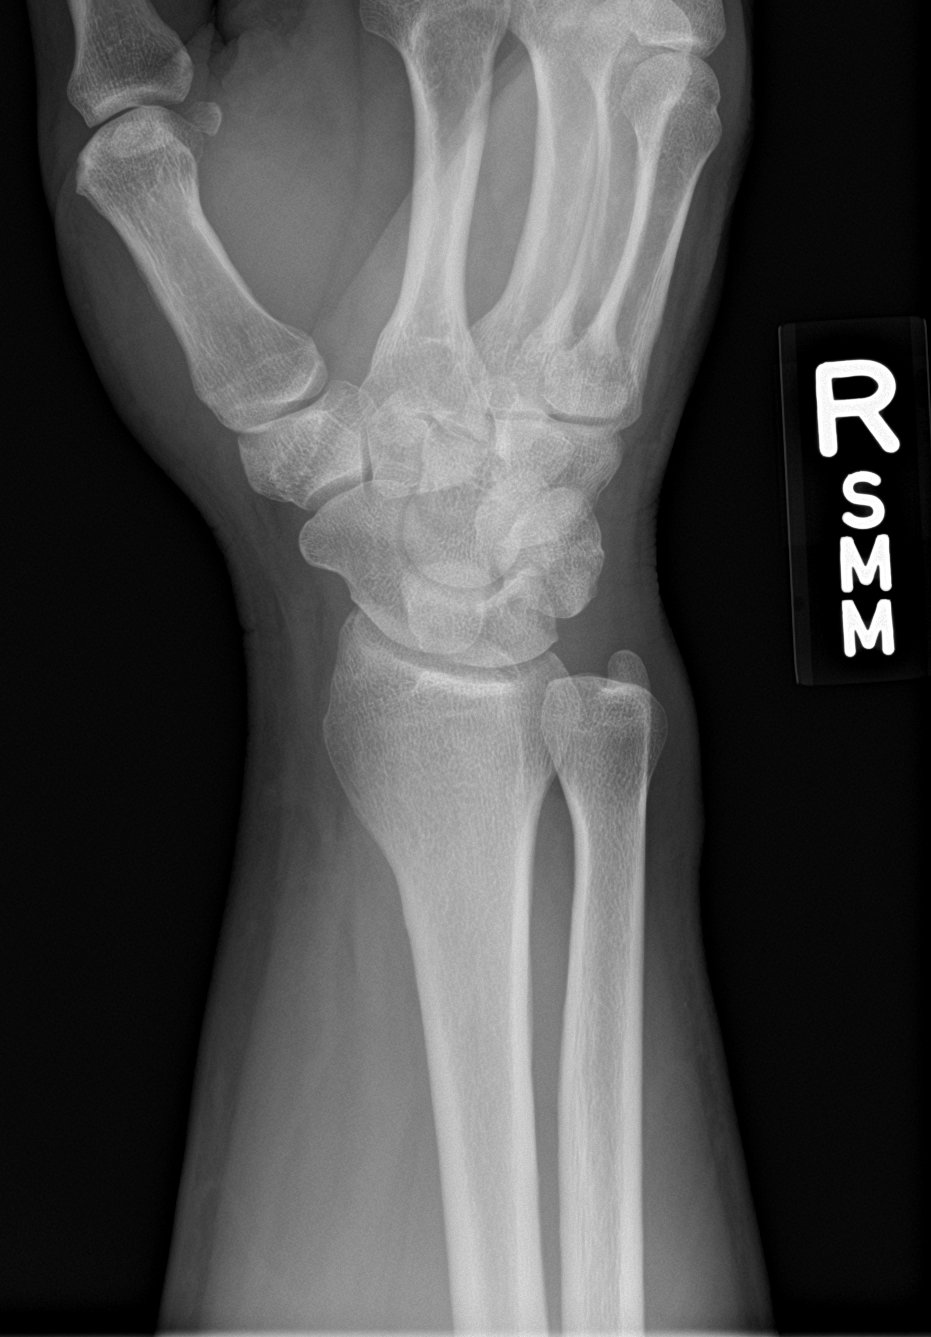

[wrist lat]
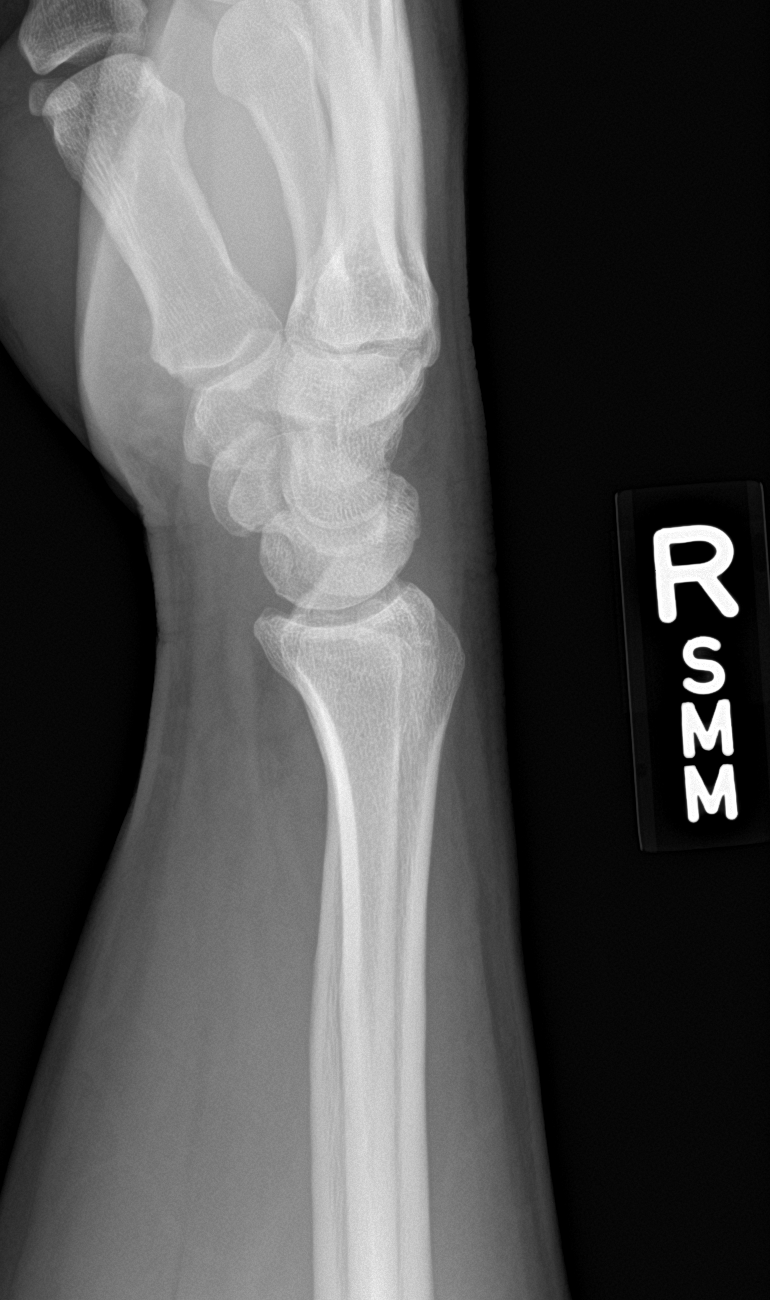

[4 of 4 positions shown; findings below may reference images not displayed]

FINDINGS: There is no evidence of fracture or dislocation. There is no
evidence of arthropathy or other focal bone abnormality. Soft
tissues are unremarkable.
IMPRESSION: Negative.

## 2021-01-24 ENCOUNTER — Ambulatory Visit: Payer: BC Managed Care – PPO | Admitting: Pharmacist

## 2021-01-24 ENCOUNTER — Ambulatory Visit (INDEPENDENT_AMBULATORY_CARE_PROVIDER_SITE_OTHER): Payer: BC Managed Care – PPO | Admitting: Family

## 2021-01-24 ENCOUNTER — Other Ambulatory Visit: Payer: Self-pay

## 2021-01-24 ENCOUNTER — Encounter: Payer: Self-pay | Admitting: Family

## 2021-01-24 VITALS — BP 120/82 | HR 96 | Temp 98.2°F | Ht 67.76 in | Wt 205.0 lb

## 2021-01-24 DIAGNOSIS — E119 Type 2 diabetes mellitus without complications: Secondary | ICD-10-CM

## 2021-01-24 DIAGNOSIS — Z125 Encounter for screening for malignant neoplasm of prostate: Secondary | ICD-10-CM | POA: Diagnosis not present

## 2021-01-24 DIAGNOSIS — E1165 Type 2 diabetes mellitus with hyperglycemia: Secondary | ICD-10-CM | POA: Diagnosis not present

## 2021-01-24 DIAGNOSIS — M67432 Ganglion, left wrist: Secondary | ICD-10-CM

## 2021-01-24 DIAGNOSIS — E785 Hyperlipidemia, unspecified: Secondary | ICD-10-CM

## 2021-01-24 LAB — POCT GLYCOSYLATED HEMOGLOBIN (HGB A1C): Hemoglobin A1C: 8.9 % — AB (ref 4.0–5.6)

## 2021-01-24 MED ORDER — RYBELSUS 3 MG PO TABS: 3.0000 mg | ORAL_TABLET | Freq: Every day | ORAL | 0 refills | Status: DC

## 2021-01-24 NOTE — Assessment & Plan Note (Addendum)
Exam consistent with ganglions cyst. Advised dedicated US to further characterize. Declines consult with Dr Stephenie Acres, [place] Nodal. Patient will let me know if he would consider this in the future.

## 2021-01-24 NOTE — Patient Instructions (Addendum)
Go to the Rybelsus website and download a savings card to use in combination with your insurance.   Make sure that you take this medication first thing in the morning on an empty stomach. Wait at least 30 minutes before you take any other medications or eat breakfast.   I scheduled a follow up phone call in 4 weeks to check on you!  [name] Feliz Beam, PharmD [phone]  Visit Information   Goals Addressed               This Visit's Progress     Patient Stated     Medication Monitoring (pt-stated)        Patient Goals/Self-Care Activities Over the next 90 days, patient will:  - take medications as prescribed check glucose twice daily, document, and provide at future appointments target a minimum of 150 minutes of moderate intensity exercise weekly         Print copy of patient instructions, educational materials, and care plan provided in person.   Plan: Telephone follow up appointment with care management team member scheduled for:  ~ 4 weeks  [name] Feliz Beam, PharmD, [place], CPP Clinical Pharmacist [employer] at [employer] [phone]

## 2021-01-24 NOTE — Patient Instructions (Addendum)
Trial Rybelsus  Start rybelsus 3mg  for 30 days and then we will increase to 7mg  once daily. You may call the office for this new prescription.  Also, with the Rybelsus, be very sure that you take on an empty stomach with a little bit of water and take 30 minutes before eating.  Do not take with other medicines and no other food with medication. This is important for effectiveness.   Remember black box warning that you may not take this medication if you or a family member is diagnosed with thyroid cancer.    Semaglutide Oral Tablets What is this medicine? SEMAGLUTIDE (Sem a GLOO tide) controls blood sugar in people with type 2 diabetes. It is used with lifestyle changes like diet and exercise. This medicine may be used for other purposes; ask your health care provider or pharmacist if you have questions. COMMON BRAND NAME(S): Rybelsus What should I tell my health care provider before I take this medicine? They need to know if you have any of these conditions: endocrine tumors (MEN 2) or if someone in your family had these tumors eye disease history of pancreatitis kidney disease stomach or intestine problems thyroid cancer or if someone in your family had thyroid cancer vision problems an unusual or allergic reaction to semaglutide, other medicines, foods, dyes, or preservatives pregnant or trying to get pregnant breast-feeding How should I use this medicine? Take this medicine by mouth. Take it as directed on the prescription label at the same time every day. Take the dose right after waking up. Do not eat or drink anything before taking it. Do not take it with any other drink except a glass of plain water that is less than 4 ounces (less than 120 mL). Do not cut, crush or chew this medicine. Swallow the tablets whole. After taking it, do not eat breakfast, drink, or take any other medicines or vitamins for at least 30 minutes. Keep taking it unless your health care provider tells you to  stop. A special MedGuide will be given to you by the pharmacist with each prescription and refill. Be sure to read this information carefully each time. Talk to your health care provider about the use of this medicine in children. Special care may be needed. Overdosage: If you think you have taken too much of this medicine contact a poison control center or emergency room at once. NOTE: This medicine is only for you. Do not share this medicine with others. What if I miss a dose? If you miss a dose, skip it. Take your next dose at the normal time. Do not take extra or 2 doses at the same time to make up for the missed dose. What may interact with this medicine? What may interact with this medicine? aminophylline carbamazepine cyclosporine digoxin levothyroxine other medicines for diabetes phenytoin tacrolimus theophylline warfarin Many medications may cause changes in blood sugar, these include: alcohol containing beverages antiviral medicines for HIV or AIDS aspirin and aspirin-like drugs certain medicines for blood pressure, heart disease, irregular heart beat chromium diuretics male hormones, such as estrogens or progestins, birth control pills fenofibrate gemfibrozil isoniazid lanreotide male hormones or anabolic steroids MAOIs like Carbex, Eldepryl, Marplan, Nardil, and Parnate medicines for weight loss medicines for allergies, asthma, cold, or cough medicines for depression, anxiety, or psychotic disturbances niacin nicotine NSAIDs, medicines for pain and inflammation, like ibuprofen or naproxen octreotide pasireotide pentamidine phenytoin probenecid quinolone antibiotics such as ciprofloxacin, levofloxacin, ofloxacin some herbal dietary supplements steroid medicines such as  prednisone or cortisone sulfamethoxazole; trimethoprim thyroid hormones Some medications can hide the warning symptoms of low blood sugar (hypoglycemia). You may need to monitor your blood  sugar more closely if you are taking one of these medications. These include: beta-blockers, often used for high blood pressure or heart problems (examples include atenolol, metoprolol, propranolol) clonidine guanethidine reserpine This list may not describe all possible interactions. Give your health care provider a list of all the medicines, herbs, non-prescription drugs, or dietary supplements you use. Also tell them if you smoke, drink alcohol, or use illegal drugs. Some items may interact with your medicine. What should I watch for while using this medicine? Visit your health care provider for regular checks on your progress. Check with your health care provider if you have severe diarrhea, nausea, and vomiting, or if you sweat a lot. The loss of too much body fluid may make it dangerous for you to take this medicine. A test called the HbA1C (A1C) will be monitored. This is a simple blood test. It measures your blood sugar control over the last 2 to 3 months. You will receive this test every 3 to 6 months. Learn how to check your blood sugar. Learn the symptoms of low and high blood sugar and how to manage them. Always carry a quick-source of sugar with you in case you have symptoms of low blood sugar. Examples include hard sugar candy or glucose tablets. Make sure others know that you can choke if you eat or drink when you develop serious symptoms of low blood sugar, such as seizures or unconsciousness. Get medical help at once. Tell your health care provider if you have high blood sugar. You might need to change the dose of your medicine. If you are sick or exercising more than usual, you might need to change the dose of your medicine. Do not skip meals. Ask your health care provider if you should avoid alcohol. Many nonprescription cough and cold products contain sugar or alcohol. These can affect blood sugar. Wear a medical ID bracelet or chain. Carry a card that describes your condition. List  the medicines and doses you take on the card. Do not become pregnant while taking this medicine. Women should inform their health care provider if they wish to become pregnant or think they might be pregnant. There is a potential for serious side effects to an unborn child. Talk to your health care provider for more information. Do not breast-feed an infant while taking this medicine. What side effects may I notice from receiving this medicine? Side effects that you should report to your doctor or health care provider as soon as possible: allergic reactions (skin rash, itching or hives; swelling of the face, lips, or tongue) changes in vision diarrhea that continues or is severe infection (fever, chills, cough, sore throat, pain or trouble passing urine) kidney injury (trouble passing urine or change in the amount of urine) low blood sugar (feeling anxious; confusion; dizziness; increased hunger; unusually weak or tired; increased sweating; shakiness; cold, clammy skin; irritable; headache; blurred vision; fast heartbeat; loss of consciousness) lump or swelling on the neck painful or difficulty swallowing severe nausea severe or unusual stomach pain trouble breathing vomiting Side effects that usually do not require medical attention (report these to your doctor or health care provider if they continue or are bothersome): constipation diarrhea nausea upset stomach This list may not describe all possible side effects. Call your doctor for medical advice about side effects. You may report side  effects to FDA at 1-800-FDA-1088. Where should I keep my medicine? Keep out of the reach of children and pets. Store at room temperature between 20 and 25 degrees C (68 and 77 degrees F). Keep this medicine in the original container. Protect from moisture. Keep the container tightly closed. Get rid of any unused medicine after the expiration date. To get rid of medicines that are no longer needed or have  expired: Take the medicine to a medicine take-back program. Check with your pharmacy or law enforcement to find a location. If you cannot return the medicine, check the label or package insert to see if the medicine should be thrown out in the garbage or flushed down the toilet. If you are not sure, ask your health care provider. If it is safe to put it in the trash, take the medicine out of the container. Mix the medicine with cat litter, dirt, coffee grounds, or other unwanted substance. Seal the mixture in a bag or container. Put it in the trash. NOTE: This sheet is a summary. It may not cover all possible information. If you have questions about this medicine, talk to your doctor, pharmacist, or health care provider.  2021 Elsevier/Gold Standard (2020-06-21 15:08:33)  https://www.foothealthfacts.org/conditions/ganglion-cyst"> https://www.clinicalkey.com">  Ganglion Cyst  A ganglion cyst is a non-cancerous, fluid-filled lump of tissue that occurs near a joint, tendon, or ligament. The cyst grows out of a joint or the lining of a tendon or ligament. Ganglion cysts most often develop in the hand or wrist, but they can also develop in the shoulder, elbow, hip, knee, ankle, orfoot. Ganglion cysts are ball-shaped or egg-shaped. Their size can range from the size of a pea to larger than a grape. Increased activity may cause the cyst toget bigger because more fluid starts to build up. What are the causes? The exact cause of this condition is not known, but it may be related to: Inflammation or irritation around the joint. An injury or tear in the layers of tissue around the joint (joint capsule). Repetitive movements or overuse. History of acute or repeated injury. What increases the risk? You are more likely to develop this condition if: You are a male. You are 31-80 years old. What are the signs or symptoms? The main symptom of this condition is a lump. It most often appears on the hand or  wrist. In many cases, there are no other symptoms, but a cyst can sometimes cause: Tingling. Pain or tenderness. Numbness. Weakness or loss of strength in the affected joint. Decreased range of motion in the affected area of the body. How is this diagnosed? Ganglion cysts are usually diagnosed based on a physical exam. Your health care provider will feel the lump and may shine a light next to it. If it is aganglion cyst, the light will likely shine through it. Your health care provider may order an X-ray, ultrasound, MRI, or CT scan torule out other conditions. How is this treated? Ganglion cysts often go away on their own without treatment. If you have pain or other symptoms, treatment may be needed. Treatment is also needed if the ganglion cyst limits your movement or if it gets infected. Treatment may include: Wearing a brace or splint on your wrist or finger. Taking anti-inflammatory medicine. Having fluid drained from the lump with a needle (aspiration). Getting an injection of medicine into the joint to decrease inflammation. This may be corticosteroids, ethanol, or hyaluronidase. Having surgery to remove the ganglion cyst. Placing a pad in your  shoe or wearing shoes that will not rub against the cyst if it is on your foot. Follow these instructions at home: Do not press on the ganglion cyst, poke it with a needle, or hit it. Take over-the-counter and prescription medicines only as told by your health care provider. If you have a brace or splint: Wear it as told by your health care provider. Remove it as told by your health care provider. Ask if you need to remove it when you take a shower or a bath. Watch your ganglion cyst for any changes. Keep all follow-up visits as told by your health care provider. This is important. Contact a health care provider if: Your ganglion cyst becomes larger or more painful. You have pus coming from the lump. You have weakness or numbness in the  affected area. You have a fever or chills. Get help right away if: You have a fever and have any of these in the cyst area: Increased redness. Red streaks. Swelling. Summary A ganglion cyst is a non-cancerous, fluid-filled lump that occurs near a joint, tendon, or ligament. Ganglion cysts most often develop in the hand or wrist, but they can also develop in the shoulder, elbow, hip, knee, ankle, or foot. Ganglion cysts often go away on their own without treatment. This information is not intended to replace advice given to you by your health care provider. Make sure you discuss any questions you have with your healthcare provider. Document Revised: 10/15/2019 Document Reviewed: 10/15/2019 Elsevier Patient Education  2022 [employer]

## 2021-01-24 NOTE — Progress Notes (Signed)
Subjective:    Patient ID: Andrew Holloway., male    DOB: 02/16/1971, 50 y.o.   MRN: [id number]  CC: Andrew Holloway. is a 50 y.o. male who presents today for follow up.   HPI: Andrew Holloway pharmD accompanied me on visit  Complains of knot dorsal area left wrist noted 10 days ago.  proximal to where we wears wrist watch and he noticing watch slides down and aggrevates area.   Non tender. No drainage.  No pain in wrist. Full range of motion  DM- compliant with metformin 1076m bid. Following low glycemic diet. Would like to loose 15 -20 lbs.  FBG 168.     No personal or family h/o thyroid cancer.     HISTORY:  Past Medical History:  Diagnosis Date   Chicken pox    [place]    Kidney stones    Motion sickness    back seat of car   Umbilical hernia 37/56/4332  Wears contact lenses    Past Surgical History:  Procedure Laterality Date   ANKLE SURGERY     x2   COLONOSCOPY WITH PROPOFOL N/A 05/03/2020   Procedure: COLONOSCOPY WITH PROPOFOL;  Surgeon: WLucilla Lame MD;  [facility]  Service: Endoscopy;  Laterality: N/A;  Diabetic - oral meds priority 4   EPIGASTRIC HERNIA REPAIR N/A 03/30/2015   Procedure: HERNIA REPAIR EPIGASTRIC ADULT;  Surgeon: JRobert Bellow MD;  Location: [facility];  Service: General;  Laterality: N/A;   HERNIA REPAIR  [id number]  umbilical hernia   HERNIA REPAIR  03/30/2015   Epigastric and umbilical hernia with retrorectus 6.4 cm Ventralex mesh   left and right ankle repair ligament     nasal spectrum     rotator cuff right     SHOULDER SURGERY     rotator cuff   Family History  Problem Relation Age of Onset   Hypertension Mother    Hyperlipidemia Father    Leukemia Father    Diabetes Maternal Grandmother    Heart disease Maternal Grandfather    Hyperlipidemia Maternal Grandfather    Heart disease Paternal Grandfather    Hyperlipidemia Paternal Grandfather    Thyroid cancer Neg Hx      Allergies: Patient has no known allergies. Current Outpatient Medications on File Prior to Visit  Medication Sig Dispense Refill   blood glucose meter kit and supplies Dispense based on patient and insurance preference. Use up to four times daily as directed. (FOR ICD-10 E10.9, E11.9). 1 each 0   CINNAMON PO Take 1 tablet by mouth daily.     glucose blood (ACCU-CHEK GUIDE) test strip Use to check blood sugar twice daily 200 each 3   metFORMIN (GLUCOPHAGE) 1000 MG tablet Take 1 tablet (1,000 mg total) by mouth 2 (two) times daily with a meal. 180 tablet 3   rosuvastatin (CRESTOR) 10 MG tablet Take 1 tablet (10 mg total) by mouth daily. 90 tablet 3   No current facility-administered medications on file prior to visit.    Social History   Tobacco Use   Smoking status: Never   Smokeless tobacco: Never  Vaping Use   Vaping Use: Never used  Substance Use Topics   Alcohol use: Yes    Alcohol/week: 0.0 standard drinks    Comment: 2x month   Drug use: No    Review of Systems  Constitutional:  Negative for chills and fever.  HENT:  Negative for congestion,  ear pain, rhinorrhea, sinus pressure and sore throat.   Eyes:  Negative for visual disturbance.  Respiratory:  Negative for cough, shortness of breath and wheezing.   Cardiovascular:  Negative for chest pain and palpitations.  Gastrointestinal:  Negative for diarrhea, nausea and vomiting.  Genitourinary:  Negative for dysuria.  Musculoskeletal:  Negative for joint swelling and myalgias.  Skin:  Negative for rash and wound.  Neurological:  Negative for headaches.  Hematological:  Negative for adenopathy.     Objective:    BP 120/82 (BP Location: Left Arm, Patient Position: Sitting, Cuff Size: Large)   Pulse 96   Temp 98.2 F (36.8 C) (Oral)   Ht 5' 7.76" (1.721 m)   Wt 205 lb (93 kg)   SpO2 99%   BMI 31.39 kg/m  BP Readings from Last 3 Encounters:  01/24/21 120/82  10/22/20 124/90  07/23/20 126/80   Wt Readings  from Last 3 Encounters:  01/24/21 205 lb (93 kg)  10/22/20 205 lb 3.2 oz (93.1 kg)  07/23/20 208 lb (94.3 kg)    Physical Exam Vitals reviewed.  Constitutional:      Appearance: He is well-developed.  Cardiovascular:     Rate and Rhythm: Regular rhythm.     Heart sounds: Normal heart sounds.  Pulmonary:     Effort: Pulmonary effort is normal. No respiratory distress.     Breath sounds: Normal breath sounds. No wheezing, rhonchi or rales.  Musculoskeletal:     Left wrist: No swelling or tenderness. Normal range of motion.       Arms:     Comments: Nontender approx 3 cm oblong shaped mass dorsal aspect of wrist. Nonfluctuant. No erythema. Full range of motion of writs without pain.   Skin:    General: Skin is warm and dry.  Neurological:     Mental Status: He is alert.  Psychiatric:        Speech: Speech normal.        Behavior: Behavior normal.       Assessment & Plan:   Problem List Items Addressed This Visit       Endocrine   Diabetes mellitus without complication ([place])    Lab Results  Component Value Date   HGBA1C 8.9 (A) 01/24/2021  Uncontrolled. Continue metformin 1051m bid, start rybelus 313m ( sample provided from [employer] Andrew and I counseled patient on side effects and black box warning including medullary thyroid cancer.        Relevant Medications   Semaglutide (RYBELSUS) 3 MG TABS     Other   Ganglion cyst of dorsum of left wrist    Exam consistent with ganglions cyst. Advised dedicated [place] further characterize. Declines consult with Dr SoPeggye LeyEmAmelia JoPatient will let me know if he would consider this in the future.          Other Visit Diagnoses     Type 2 diabetes mellitus with hyperglycemia, without long-term current use of insulin (HCC)    -  Primary   Relevant Medications   Semaglutide (RYBELSUS) 3 MG TABS   Other Relevant Orders   POCT HgB A1C (Completed)   Microalbumin / creatinine urine ratio   Screening for prostate  cancer       Relevant Orders   PSA        I am having Andrew BushyLoftis Jr. "SONNY" start on Rybelsus. I am also having him maintain his blood glucose meter kit and supplies, CINNAMON PO, rosuvastatin,  Accu-Chek Guide, and metFORMIN.   Meds ordered this encounter  Medications   Semaglutide (RYBELSUS) 3 MG TABS    Sig: Take 3 mg by mouth daily.    Dispense:  30 tablet    Refill:  0    <SAMPLE>    Order Specific Question:   Supervising Provider    Answer:   Crecencio Mc [[id number]]    Return precautions given.   Risks, benefits, and alternatives of the medications and treatment plan prescribed today were discussed, and patient expressed understanding.   Education regarding symptom management and diagnosis given to patient on AVS.  Continue to follow with Burnard Hawthorne, FNP for routine health maintenance.   Grace Bushy Langill Jr. and I agreed with plan.   [name], FNP

## 2021-01-24 NOTE — Assessment & Plan Note (Signed)
Lab Results  Component Value Date   HGBA1C 8.9 (A) 01/24/2021   Uncontrolled. Continue metformin 1000mg  bid, start rybelus 3mg   ( sample provided from ) . Andrew Holloway and I counseled patient on side effects and black box warning including medullary thyroid cancer.

## 2021-01-24 NOTE — Chronic Care Management (AMB) (Signed)
Care Management   Pharmacy Note  01/24/2021 Name: Denzel Etienne. MRN: [id number] DOB: 01/08/1971  Subjective: Grace Bushy [place] Brooke Bonito. is a 50 y.o. year old male who is a primary care patient of Burnard Hawthorne, FNP. The Care Management team was consulted for assistance with care management and care coordination needs.    Engaged with patient face to face for follow up visit in response to provider referral for pharmacy case management and/or care coordination services.   The patient was given information about Care Management services today including:  Care Management services includes personalized support from designated clinical staff supervised by the patient's primary care provider, including individualized plan of care and coordination with other care providers. 24/7 contact phone numbers for assistance for urgent and routine care needs. The patient may stop case management services at any time by phone call to the office staff.  Patient agreed to services and consent obtained.  Assessment:  Review of patient status, including review of consultants reports, laboratory and other test data, was performed as part of comprehensive evaluation and provision of chronic care management services.   SDOH (Social Determinants of Health) assessments and interventions performed:  SDOH Interventions    Flowsheet Row Most Recent Value  SDOH Interventions   Financial Strain Interventions Other (Comment)  [discussed savings card]        Objective:  Lab Results  Component Value Date   CREATININE 0.86 06/07/2020   CREATININE 0.89 04/16/2020   CREATININE 0.87 02/16/2020    Lab Results  Component Value Date   HGBA1C 8.9 (A) 01/24/2021       Component Value Date/Time   CHOL 101 06/07/2020 0803   TRIG 115.0 06/07/2020 0803   HDL 35.30 (L) 06/07/2020 0803   CHOLHDL 3 06/07/2020 0803   VLDL 23.0 06/07/2020 0803   LDLCALC 42 06/07/2020 0803   LDLDIRECT 103.0 06/16/2019 1628      Clinical ASCVD: No  The ASCVD Risk score (Goff DC Jr., et al., 2013) failed to calculate for the following reasons:   The valid total cholesterol range is 130 to 320 mg/dL     BP Readings from Last 3 Encounters:  01/24/21 120/82  10/22/20 124/90  07/23/20 126/80    Care Plan  No Known Allergies  Medications Reviewed Today     Reviewed by Burnard Hawthorne, FNP (Family Nurse Practitioner) on 01/24/21 at [place] List Status: <None>   Medication Order Taking? Sig Documenting Provider Last Dose Status Informant  blood glucose meter kit and supplies [id number] Yes Dispense based on patient and insurance preference. Use up to four times daily as directed. (FOR ICD-10 E10.9, E11.9). [name], FNP Taking Active   CINNAMON PO [id number] Yes Take 1 tablet by mouth daily. [provider] Taking Active   glucose blood (ACCU-CHEK GUIDE) test strip [id number] Yes Use to check blood sugar twice daily Burnard Hawthorne, FNP Taking Active   metFORMIN (GLUCOPHAGE) 1000 MG tablet [id number] Yes Take 1 tablet (1,000 mg total) by mouth 2 (two) times daily with a meal. Burnard Hawthorne, FNP Taking Active   rosuvastatin (CRESTOR) 10 MG tablet [id number] Yes Take 1 tablet (10 mg total) by mouth daily. Burnard Hawthorne, FNP Taking Active             Patient Active Problem List   Diagnosis Date Noted   Ganglion cyst of dorsum of left wrist 01/24/2021   Encounter for screening colonoscopy    Skin lesion 03/05/2020  Low back pain 10/08/2019   HLD (hyperlipidemia) 08/06/2019   Diabetes mellitus without complication ([place]) 52/77/8242   Abscess of postoperative wound of abdominal wall 05/03/2015   Recurrent umbilical hernia 35/36/1443   Epigastric hernia 03/17/2015   Obesity, unspecified 02/13/2013   Skin lesion of scalp 15/40/0867   Umbilical hernia 61/95/0932    Conditions to be addressed/monitored: HLD and DMII  Care Plan : Medication Management  Updates made by  De Hollingshead, RPH-CPP since 01/24/2021 12:00 AM     Problem: Diabetes, HLD      Long-Range Goal: Disease Progression Prevention   This Visit's Progress: On track  Recent Progress: On track  Priority: High  Note:   Current Barriers:  Unable to maintain control of diabetes  Pharmacist Clinical Goal(s):  Over the next 90 days, patient will achieve control of diabetes as evidenced by A1c  through collaboration with PharmD and provider.   Interventions: 1:1 collaboration with Burnard Hawthorne, FNP regarding development and update of comprehensive plan of care as evidenced by provider attestation and co-signature Inter-disciplinary care team collaboration (see longitudinal plan of care) Comprehensive medication review performed; medication list updated in electronic medical record  Diabetes: Uncontrolled; current treatment: metformin 1000 mg BID Current glucose readings: fasting: 160-180s; fastings: 160s  Counseled on GLP1 agonists, including mechanism of action, side effects, and benefits. No personal or family history of medullary thyroid cancer, personal history of pancreatitis or gallbladder disease. Counseled on potential side effects of nausea, stomach upset, queasiness, constipation, and that these generally improve over time. Advised to contact our office with more severe symptoms, including nausea, diarrhea, stomach pain. Patient verbalized understanding. P Patient declines Ozempic due to needle phobia. Recommend to start Rybelsus 3 mg once daily. Take first thing in the morning on an empty stomach and wait 30 minutes before eating or taking other medications. Patient notes this will fit his morning routine. Provided sample. Discussed savings card for future fills.  Counseled that 3 mg dose may not have significant glycemic impact, but is more for establishing tolerability. Follow up call scheduled in ~ 4 weeks to determine if dose needs to be titrated.    Hyperlipidemia: Controlled per last lipid panel; current treatment: rosuvastatin 10 mg daily  Recommended to continue current regimen at this time  Patient Goals/Self-Care Activities Over the next 90 days, patient will:  - take medications as prescribed check glucose twice daily, document, and provide at future appointments target a minimum of 150 minutes of moderate intensity exercise weekly  Follow Up Plan: Telephone follow up appointment with care management team member scheduled for: ~4 weeks      Medication Assistance:  None required.  Patient affirms current coverage meets needs.  Follow Up:  Patient agrees to Care Plan and Follow-up.  Plan: Telephone follow up appointment with care management team member scheduled for:  ~ 4 weeks  [name], PharmD, [place], [place] Clinical Pharmacist [employer] at [employer] [phone]

## 2021-02-01 ENCOUNTER — Other Ambulatory Visit: Payer: BC Managed Care – PPO

## 2021-02-18 ENCOUNTER — Ambulatory Visit: Payer: BC Managed Care – PPO | Admitting: Pharmacist

## 2021-02-18 DIAGNOSIS — E119 Type 2 diabetes mellitus without complications: Secondary | ICD-10-CM

## 2021-02-18 DIAGNOSIS — E1165 Type 2 diabetes mellitus with hyperglycemia: Secondary | ICD-10-CM

## 2021-02-18 DIAGNOSIS — E785 Hyperlipidemia, unspecified: Secondary | ICD-10-CM

## 2021-02-18 MED ORDER — RYBELSUS 7 MG PO TABS: 7.0000 mg | ORAL_TABLET | Freq: Every day | ORAL | 1 refills | Status: DC

## 2021-02-18 NOTE — Patient Instructions (Signed)
Visit Information   Goals Addressed               This Visit's Progress     Patient Stated     Medication Monitoring (pt-stated)        Patient Goals/Self-Care Activities Over the next 90 days, patient will:  - take medications as prescribed check glucose twice daily, document, and provide at future appointments target a minimum of 150 minutes of moderate intensity exercise weekly         Patient verbalizes understanding of instructions provided today and agrees to view in MyChart.    Plan: Telephone follow up appointment with care management team member scheduled for:  ~ 12 weeks  [name] Feliz Beam, PharmD, [place], CPP Clinical Pharmacist [employer] at [employer] [phone]

## 2021-02-18 NOTE — Chronic Care Management (AMB) (Signed)
Chronic Care Management Pharmacy Note  02/18/2021 Name:  Andrew Besancon Nolton Jr. MRN:  [id number] DOB:  1971/07/16   Subjective: Andrew Bushy [place] Brooke Bonito. is an 50 y.o. year old male who is a primary patient of Burnard Hawthorne, FNP.  The CCM team was consulted for assistance with disease management and care coordination needs.    Engaged with patient by telephone for follow up visit in response to provider referral for pharmacy case management and/or care coordination services.   Consent to Services:  The patient was given information about Chronic Care Management services, agreed to services, and gave verbal consent prior to initiation of services.  Please see initial visit note for detailed documentation.   Patient Care Team: Burnard Hawthorne, FNP as PCP - General (Family Medicine) Bary Castilla, Forest Gleason, MD (General Surgery) Wendie Agreste, MD as Consulting Physician (Family Medicine) De Hollingshead, RPH-CPP as Pharmacist (Pharmacist)  Objective:  Lab Results  Component Value Date   CREATININE 0.86 06/07/2020   CREATININE 0.89 04/16/2020   CREATININE 0.87 02/16/2020    Lab Results  Component Value Date   HGBA1C 8.9 (A) 01/24/2021   Last diabetic Eye exam: No results found for: HMDIABEYEEXA  Last diabetic Foot exam: No results found for: HMDIABFOOTEX      Component Value Date/Time   CHOL 101 06/07/2020 0803   TRIG 115.0 06/07/2020 0803   HDL 35.30 (L) 06/07/2020 0803   CHOLHDL 3 06/07/2020 0803   VLDL 23.0 06/07/2020 0803   LDLCALC 42 06/07/2020 0803   LDLDIRECT 103.0 06/16/2019 1628    Hepatic Function Latest Ref Rng & Units 07/23/2020 06/07/2020 04/16/2020  Total Protein 6.1 - 8.1 g/dL 7.4 7.5 7.1  Albumin 3.5 - 5.2 g/dL - 4.5 4.5  AST 10 - 35 U/L _0 ALT 9 - 46 U/L _1 Alk Phosphatase 39 - 117 U/L - 38(L) 52  Total Bilirubin 0.2 - 1.2 mg/dL 0.9 1.6(H) 0.8  Bilirubin, Direct 0.0 - 0.2 mg/dL 0.2 - -    Lab Results  Component Value Date/Time    TSH 2.18 06/16/2019 04:28 PM    CBC Latest Ref Rng & Units 06/16/2019 11/04/2014  WBC 4.0 - 10.5 K/uL 6.6 14.4(H)  Hemoglobin 13.0 - 17.0 g/dL 17.0 17.0  Hematocrit 39.0 - 52.0 % 49.6 50.7  Platelets 150.0 - 400.0 K/uL 249.0 290    No results found for: VD25OH  Clinical ASCVD: No  The ASCVD Risk score Mikey Bussing DC Jr., et al., 2013) failed to calculate for the following reasons:   The valid total cholesterol range is 130 to 320 mg/dL      Social History   Tobacco Use  Smoking Status Never  Smokeless Tobacco Never   BP Readings from Last 3 Encounters:  01/24/21 120/82  10/22/20 124/90  07/23/20 126/80   Pulse Readings from Last 3 Encounters:  01/24/21 96  10/22/20 100  07/23/20 83   Wt Readings from Last 3 Encounters:  01/24/21 205 lb (93 kg)  10/22/20 205 lb 3.2 oz (93.1 kg)  07/23/20 208 lb (94.3 kg)    Assessment: Review of patient past medical history, allergies, medications, health status, including review of consultants reports, laboratory and other test data, was performed as part of comprehensive evaluation and provision of chronic care management services.   SDOH:  (Social Determinants of Health) assessments and interventions performed: none   CCM Care Plan  No Known Allergies  Medications Reviewed Today  Reviewed by De Hollingshead, RPH-CPP (Pharmacist) on 02/18/21 at [place] List Status: <None>   Medication Order Taking? Sig Documenting Provider Last Dose Status Informant  blood glucose meter kit and supplies [id number]  Dispense based on patient and insurance preference. Use up to four times daily as directed. (FOR ICD-10 E10.9, E11.9). [name], FNP  Active   CINNAMON PO [id number]  Take 1 tablet by mouth daily. [provider]  Active   glucose blood (ACCU-CHEK GUIDE) test strip [id number]  Use to check blood sugar twice daily Burnard Hawthorne, FNP  Active   metFORMIN (GLUCOPHAGE) 1000 MG tablet [id number] Yes Take 1 tablet  (1,000 mg total) by mouth 2 (two) times daily with a meal. Burnard Hawthorne, FNP Taking Active   rosuvastatin (CRESTOR) 10 MG tablet [id number] Yes Take 1 tablet (10 mg total) by mouth daily. Burnard Hawthorne, FNP Taking Active   Semaglutide (RYBELSUS) 3 MG TABS [id number] Yes Take 3 mg by mouth daily. Burnard Hawthorne, FNP Taking Active             Patient Active Problem List   Diagnosis Date Noted   Ganglion cyst of dorsum of left wrist 01/24/2021   Encounter for screening colonoscopy    Skin lesion 03/05/2020   Low back pain 10/08/2019   HLD (hyperlipidemia) 08/06/2019   Diabetes mellitus without complication ([place]) 92/42/6834   Abscess of postoperative wound of abdominal wall 05/03/2015   Recurrent umbilical hernia 19/62/2297   Epigastric hernia 03/17/2015   Obesity, unspecified 02/13/2013   Skin lesion of scalp 98/92/1194   Umbilical hernia 17/40/8144    Immunization History  Administered Date(s) Administered   Moderna Sars-Covid-2 Vaccination 11/07/2019, 12/05/2019   Td 07/13/2018    Conditions to be addressed/monitored: HLD and DMII  Care Plan : Medication Management  Updates made by De Hollingshead, RPH-CPP since 02/18/2021 12:00 AM     Problem: Diabetes, HLD      Long-Range Goal: Disease Progression Prevention   This Visit's Progress: On track  Recent Progress: On track  Priority: High  Note:   Current Barriers:  Unable to maintain control of diabetes  Pharmacist Clinical Goal(s):  Over the next 90 days, patient will achieve control of diabetes as evidenced by A1c  through collaboration with PharmD and provider.   Interventions: 1:1 collaboration with Burnard Hawthorne, FNP regarding development and update of comprehensive plan of care as evidenced by provider attestation and co-signature Inter-disciplinary care team collaboration (see longitudinal plan of care) Comprehensive medication review performed; medication list updated in electronic  medical record  Diabetes: Uncontrolled; current treatment: metformin 1000 mg BID, Rybelsus 3 mg QAM Denies any GI upset with starting Rybelsus. Does indicate decrease in appetite. Does note that is had a bad taste. Current glucose readings: 3-4 pm: 110-190s Notes that he has been trying to be more cognizant of food choices, portion sizes. Breakfast: 2 protein bars, if he gets a biscuit, will eat the sausage/egg/cheese and throw out the biscuit Denies any weight loss at this time Recommend to increase Rybelsus dose to target glycemic control, weight loss. Patient amenable. Discussed with primary care provider, she is in agreement. Start Rybelsus 7 mg daily. Script sent to the pharmacy. Discussed manufacturer savings card available on website.   Hyperlipidemia: Controlled per last lipid panel; current treatment: rosuvastatin 10 mg daily  Previously recommended to continue current regimen at this time  Patient Goals/Self-Care Activities Over the next 90 days, patient will:  -  take medications as prescribed check glucose twice daily, document, and provide at future appointments target a minimum of 150 minutes of moderate intensity exercise weekly  Follow Up Plan: Telephone follow up appointment with care management team member scheduled for: ~12 weeks      Medication Assistance: None required.  Patient affirms current coverage meets needs.  Patient's preferred pharmacy is:  [id number] [address], [place] [address] Phone: [phone] Fax: [phone]    Follow Up:  Patient agrees to Care Plan and Follow-up.  Plan: Telephone follow up appointment with care management team member scheduled for:  ~ 12 weeks  [name], PharmD, [place], [place] Clinical Pharmacist [employer] at [employer] [phone]

## 2021-03-02 ENCOUNTER — Ambulatory Visit: Payer: BC Managed Care – PPO | Admitting: Pharmacist

## 2021-03-02 ENCOUNTER — Telehealth: Payer: Self-pay | Admitting: Family

## 2021-03-02 DIAGNOSIS — E785 Hyperlipidemia, unspecified: Secondary | ICD-10-CM

## 2021-03-02 DIAGNOSIS — E1165 Type 2 diabetes mellitus with hyperglycemia: Secondary | ICD-10-CM

## 2021-03-02 NOTE — Patient Instructions (Signed)
Visit Information   Goals Addressed               This Visit's Progress     Patient Stated     Medication Monitoring (pt-stated)        Patient Goals/Self-Care Activities Over the next 90 days, patient will:  - take medications as prescribed check glucose twice daily, document, and provide at future appointments target a minimum of 150 minutes of moderate intensity exercise weekly         Patient verbalizes understanding of instructions provided today and agrees to view in MyChart.    Plan: Telephone follow up appointment with care management team member scheduled for:  ~ 12 weeks  [name] Dorn Hartshorne, PharmD, BCACP, CPP Clinical Pharmacist [facility] at [place] [phone]  

## 2021-03-02 NOTE — Chronic Care Management (AMB) (Signed)
Care Management   Pharmacy Note  03/02/2021 Name: Andrew Holloway. MRN: [id number] DOB: 02-Jan-1971  Subjective: Andrew Holloway. is a 50 y.o. year old male who is a primary care patient of Burnard Hawthorne, FNP. The Care Management team was consulted for assistance with care management and care coordination needs.    Engaged with patient by telephone for  medication access  in response to provider referral for pharmacy case management and/or care coordination services.   The patient was given information about Care Management services today including:  Care Management services includes personalized support from designated clinical staff supervised by the patient's primary care provider, including individualized plan of care and coordination with other care providers. 24/7 contact phone numbers for assistance for urgent and routine care needs. The patient may stop case management services at any time by phone call to the office staff.  Patient agreed to services and consent obtained.  Assessment:  Review of patient status, including review of consultants reports, laboratory and other test data, was performed as part of comprehensive evaluation and provision of chronic care management services.   SDOH (Social Determinants of Health) assessments and interventions performed:  SDOH Interventions    Flowsheet Row Most Recent Value  SDOH Interventions   Financial Strain Interventions Other (Comment)  [high deductible insurance plan]        Objective:  Lab Results  Component Value Date   CREATININE 0.86 06/07/2020   CREATININE 0.89 04/16/2020   CREATININE 0.87 02/16/2020    Lab Results  Component Value Date   HGBA1C 8.9 (A) 01/24/2021       Component Value Date/Time   CHOL 101 06/07/2020 0803   TRIG 115.0 06/07/2020 0803   HDL 35.30 (L) 06/07/2020 0803   CHOLHDL 3 06/07/2020 0803   VLDL 23.0 06/07/2020 0803   LDLCALC 42 06/07/2020 0803   LDLDIRECT 103.0 06/16/2019  1628    Clinical ASCVD: No  The ASCVD Risk score (Goff DC Jr., et al., 2013) failed to calculate for the following reasons:   The valid total cholesterol range is 130 to 320 mg/dL      BP Readings from Last 3 Encounters:  01/24/21 120/82  10/22/20 124/90  07/23/20 126/80    Care Plan  No Known Allergies  Medications Reviewed Today     Reviewed by De Hollingshead, RPH-CPP (Pharmacist) on 02/18/21 at 88  Med List Status: <None>   Medication Order Taking? Sig Documenting Provider Last Dose Status Informant  blood glucose meter kit and supplies [id number]  Dispense based on patient and insurance preference. Use up to four times daily as directed. (FOR ICD-10 E10.9, E11.9). [name], FNP  Active   CINNAMON PO [id number]  Take 1 tablet by mouth daily. [provider]  Active   glucose blood (ACCU-CHEK GUIDE) test strip [id number]  Use to check blood sugar twice daily Burnard Hawthorne, FNP  Active   metFORMIN (GLUCOPHAGE) 1000 MG tablet [id number] Yes Take 1 tablet (1,000 mg total) by mouth 2 (two) times daily with a meal. Burnard Hawthorne, FNP Taking Active   rosuvastatin (CRESTOR) 10 MG tablet [id number] Yes Take 1 tablet (10 mg total) by mouth daily. Burnard Hawthorne, FNP Taking Active   Semaglutide (RYBELSUS) 3 MG TABS [id number] Yes Take 3 mg by mouth daily. Burnard Hawthorne, FNP Taking Active             Patient Active Problem List   Diagnosis Date Noted  Ganglion cyst of dorsum of left wrist 01/24/2021   Encounter for screening colonoscopy    Skin lesion 03/05/2020   Low back pain 10/08/2019   HLD (hyperlipidemia) 08/06/2019   Diabetes mellitus without complication ([place]) 26/66/6486   Abscess of postoperative wound of abdominal wall 05/03/2015   Recurrent umbilical hernia 16/07/2399   Epigastric hernia 03/17/2015   Obesity, unspecified 02/13/2013   Skin lesion of scalp 18/04/7043   Umbilical hernia 92/52/4159    Conditions to be  addressed/monitored: HLD and DMII  Care Plan : Medication Management  Updates made by De Hollingshead, RPH-CPP since 03/02/2021 12:00 AM     Problem: Diabetes, HLD      Long-Range Goal: Disease Progression Prevention   This Visit's Progress: On track  Recent Progress: On track  Priority: High  Note:   Current Barriers:  Unable to maintain control of diabetes  Pharmacist Clinical Goal(s):  Over the next 90 days, patient will achieve control of diabetes as evidenced by A1c  through collaboration with PharmD and provider.   Interventions: 1:1 collaboration with Burnard Hawthorne, FNP regarding development and update of comprehensive plan of care as evidenced by provider attestation and co-signature Inter-disciplinary care team collaboration (see longitudinal plan of care) Comprehensive medication review performed; medication list updated in electronic medical record  Diabetes: Uncontrolled; current treatment: metformin 1000 mg BID, Rybelsus 3 mg QAM Calls today to report that the Rybelsus with the manufacturer savings card is >$500. 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 Discussed that there are no other options to reduce the cost of brand name medications other than him paying through his deductible. Recommend to discontinue Rybelsus. Alternative options would be glipizide or pioglitazone. Patient declines to add additional medication at this time. Recommend to continue metformin 1000 mg BID at this time with lifestyle modification and follow up with PCP in September as scheduled  Hyperlipidemia: Controlled per last lipid panel; current treatment: rosuvastatin 10 mg daily  Previously recommended to continue current regimen at this time  Patient Goals/Self-Care Activities Over the next 90 days, patient will:  - take medications as prescribed check glucose twice daily, document, and provide at future appointments target a minimum of 150 minutes of moderate  intensity exercise weekly  Follow Up Plan: Telephone follow up appointment with care management team member scheduled for: ~12 weeks as previously scheduled      Medication Assistance:  None required.  Patient affirms current coverage meets needs.  Follow Up:  Patient agrees to Care Plan and Follow-up.  Plan: Telephone follow up appointment with care management team member scheduled for:  ~ 12 weeks  [name], PharmD, [place], [place] Clinical Pharmacist [employer] at [employer] [phone]

## 2021-03-02 NOTE — Telephone Encounter (Signed)
Pt called the Semaglutide (RYBELSUS) 7 MG TABS for him was going to cost him $500 a month, he wanted to know if there is help with the medication or something else that can be called in

## 2021-03-02 NOTE — Telephone Encounter (Signed)
Returned patient call. See CCM documentation 

## 2021-03-07 ENCOUNTER — Other Ambulatory Visit: Payer: Self-pay | Admitting: Family

## 2021-03-07 DIAGNOSIS — E785 Hyperlipidemia, unspecified: Secondary | ICD-10-CM

## 2021-03-31 DIAGNOSIS — E119 Type 2 diabetes mellitus without complications: Secondary | ICD-10-CM | POA: Diagnosis not present

## 2021-03-31 LAB — HM DIABETES EYE EXAM

## 2021-05-03 ENCOUNTER — Ambulatory Visit: Payer: BC Managed Care – PPO | Admitting: Family

## 2021-05-20 ENCOUNTER — Telehealth: Payer: BC Managed Care – PPO

## 2021-05-23 ENCOUNTER — Ambulatory Visit: Payer: BC Managed Care – PPO | Admitting: Pharmacist

## 2021-05-23 DIAGNOSIS — E119 Type 2 diabetes mellitus without complications: Secondary | ICD-10-CM

## 2021-05-23 DIAGNOSIS — E785 Hyperlipidemia, unspecified: Secondary | ICD-10-CM

## 2021-05-23 NOTE — Chronic Care Management (AMB) (Signed)
Care Management   Pharmacy Note  05/23/2021 Name: Andrew Holloway. MRN: [id number] DOB: [birth year]  Subjective: Andrew Holloway. is a 50 y.o. year old male who is a primary care patient of Burnard Hawthorne, FNP. The Care Management team was consulted for assistance with care management and care coordination needs.    Engaged with patient by telephone for follow up visit in response to provider referral for pharmacy case management and/or care coordination services.   The patient was given information about Care Management services today including:  Care Management services includes personalized support from designated clinical staff supervised by the patient's primary care provider, including individualized plan of care and coordination with other care providers. 24/7 contact phone numbers for assistance for urgent and routine care needs. The patient may stop case management services at any time by phone call to the office staff.  Patient agreed to services and consent obtained.  Assessment:  Review of patient status, including review of consultants reports, laboratory and other test data, was performed as part of comprehensive evaluation and provision of chronic care management services.   SDOH (Social Determinants of Health) assessments and interventions performed:  SDOH Interventions    Flowsheet Row Most Recent Value  SDOH Interventions   Financial Strain Interventions Intervention Not Indicated        Objective:  Lab Results  Component Value Date   CREATININE 0.86 06/07/2020   CREATININE 0.89 04/16/2020   CREATININE 0.87 02/16/2020    Lab Results  Component Value Date   HGBA1C 8.9 (A) 01/24/2021       Component Value Date/Time   CHOL 101 06/07/2020 0803   TRIG 115.0 06/07/2020 0803   HDL 35.30 (L) 06/07/2020 0803   CHOLHDL 3 06/07/2020 0803   VLDL 23.0 06/07/2020 0803   LDLCALC 42 06/07/2020 0803   LDLDIRECT 103.0 06/16/2019 1628    BP Readings  from Last 3 Encounters:  01/24/21 120/82  10/22/20 124/90  07/23/20 126/80    Care Plan  No Known Allergies  Medications Reviewed Today     Reviewed by De Hollingshead, RPH-CPP (Pharmacist) on 05/23/21 at 35  Med List Status: <None>   Medication Order Taking? Sig Documenting Provider Last Dose Status Informant  APPLE CIDER VINEGAR PO [id number] Yes Take 1 tablet by mouth daily. [provider] Taking Active   blood glucose meter kit and supplies [id number]  Dispense based on patient and insurance preference. Use up to four times daily as directed. (FOR ICD-10 E10.9, E11.9). [name], FNP  Active   glucose blood (ACCU-CHEK GUIDE) test strip [id number]  Use to check blood sugar twice daily Burnard Hawthorne, FNP  Active   metFORMIN (GLUCOPHAGE) 1000 MG tablet [id number] Yes Take 1 tablet (1,000 mg total) by mouth 2 (two) times daily with a meal. Burnard Hawthorne, FNP Taking Active   rosuvastatin (CRESTOR) 10 MG tablet [id number] Yes TAKE 1 TABLET(10 MG) BY MOUTH DAILY Burnard Hawthorne, FNP Taking Active             Patient Active Problem List   Diagnosis Date Noted   Ganglion cyst of dorsum of left wrist 01/24/2021   Encounter for screening colonoscopy    Skin lesion 03/05/2020   Low back pain 10/08/2019   HLD (hyperlipidemia) 08/06/2019   Diabetes mellitus without complication ([place]) 28/78/6767   Abscess of postoperative wound of abdominal wall 05/03/2015   Recurrent umbilical hernia 20/94/7096   Epigastric hernia 03/17/2015   Obesity, unspecified  02/13/2013   Skin lesion of scalp 43/83/7793   Umbilical hernia 96/88/6484    Conditions to be addressed/monitored: HLD and DMII  Care Plan : Medication Management  Updates made by De Hollingshead, RPH-CPP since 05/23/2021 12:00 AM     Problem: Diabetes, HLD      Long-Range Goal: Disease Progression Prevention   This Visit's Progress: On track  Recent Progress: On track  Priority: High  Note:    Current Barriers:  Unable to maintain control of diabetes  Pharmacist Clinical Goal(s):  Over the next 90 days, patient will achieve control of diabetes as evidenced by A1c  through collaboration with PharmD and provider.   Interventions: 1:1 collaboration with Burnard Hawthorne, FNP regarding development and update of comprehensive plan of care as evidenced by provider attestation and co-signature Inter-disciplinary care team collaboration (see longitudinal plan of care) Comprehensive medication review performed; medication list updated in electronic medical record  Health Maintenance   Yearly diabetic eye exam: due - recommended to pursue Yearly diabetic foot exam: up to date Urine microalbumin: due - ordered Yearly influenza vaccination: due  - recommended to pursue, though patient has historically not received.  Td/Tdap vaccination: up to date Pneumonia vaccination: due  COVID vaccinations: due - recommended bivalent booster Shingrix vaccinations: due - will discuss moving forward  Colonoscopy: up to date  Diabetes: Uncontrolled; current treatment: metformin 1000 mg BID Reports feeling significantly better with new lifestyle routine. Has only been doing this for a few weeks.  Previously tried - Rybelsus, copay was too expensive due to high deductible health plan Current meal pattern: new meal pattern eating <2100 calories daily, focused on >115 g protein, 1 serving of carbohydrates at lunch; sometimes protein bars/shakes; 2 meals - lunch and supper. Intermittent fasting from ~ 6 pm to noon the next day; Current exercise patterns: at least 10,000 steps daily, integrated strength training 2 times weekly Current glucose readings: fasting: ~120s; post prandials: average 170 after meals (2-4 hours after meals) Encouraged to continue current regimen at this time. Follow up with PCP at upcoming visit. Requests refill on metformin today. Will collaborate w/ PCP to have this done.    Hyperlipidemia: Controlled per last lipid panel; current treatment: rosuvastatin 10 mg daily  Previously recommended to continue current regimen at this time  Patient Goals/Self-Care Activities Over the next 90 days, patient will:  - take medications as prescribed check glucose twice daily, document, and provide at future appointments target a minimum of 150 minutes of moderate intensity exercise weekly  Follow Up Plan: Telephone follow up appointment with care management team member scheduled for: ~8 weeks      Medication Assistance:  None required.  Patient affirms current coverage meets needs.  Follow Up:  Patient agrees to Care Plan and Follow-up.  Plan: Telephone follow up appointment with care management team member scheduled for:  8 weeks  [name], PharmD, [place], [place] Clinical Pharmacist [employer] at [employer] [phone]

## 2021-05-23 NOTE — Patient Instructions (Signed)
Sonny,   Keep up the great work!  Please have your eye doctor fax the results of your most recent diabetic eye exam to our office at [phone].   We recommend you get the influenza vaccine for this season.   We recommend you get the updated bivalent COVID-19 booster, at least 2 months after any prior doses. You may consider delaying a booster dose by 3 months from a prior episode of COVID-19 per the CDC.   You can find pharmacies that have this formulation in stock at [email or web address].   Take care! [name] Feliz Beam, PharmD   Visit Information   Goals Addressed               This Visit's Progress     Patient Stated     Medication Monitoring (pt-stated)        Patient Goals/Self-Care Activities Over the next 90 days, patient will:  - take medications as prescribed check glucose twice daily, document, and provide at future appointments target a minimum of 150 minutes of moderate intensity exercise weekly         Patient verbalizes understanding of instructions provided today and agrees to view in MyChart.   Plan: Telephone follow up appointment with care management team member scheduled for:  8 weeks  [name] Feliz Beam, PharmD, [place], CPP Clinical Pharmacist [employer] at [employer] [phone]

## 2021-05-24 ENCOUNTER — Other Ambulatory Visit: Payer: Self-pay | Admitting: Family

## 2021-05-24 DIAGNOSIS — E1165 Type 2 diabetes mellitus with hyperglycemia: Secondary | ICD-10-CM

## 2021-05-24 MED ORDER — METFORMIN HCL 1000 MG PO TABS: 1000.0000 mg | ORAL_TABLET | Freq: Two times a day (BID) | ORAL | 3 refills | Status: DC

## 2021-06-01 ENCOUNTER — Ambulatory Visit: Payer: BC Managed Care – PPO | Admitting: Family

## 2021-07-06 ENCOUNTER — Ambulatory Visit: Payer: BC Managed Care – PPO | Admitting: Family

## 2021-07-27 ENCOUNTER — Ambulatory Visit: Payer: BC Managed Care – PPO | Admitting: Pharmacist

## 2021-07-27 DIAGNOSIS — E785 Hyperlipidemia, unspecified: Secondary | ICD-10-CM

## 2021-07-27 DIAGNOSIS — E119 Type 2 diabetes mellitus without complications: Secondary | ICD-10-CM

## 2021-07-27 NOTE — Patient Instructions (Signed)
Visit Information  Following are the goals we discussed today:  Patient Goals/Self-Care Activities Over the next 90 days, patient will:  - take medications as prescribed check glucose twice daily, document, and provide at future appointments target a minimum of 150 minutes of moderate intensity exercise weekly        Plan: video visit scheduled in 3 months     [name] Feliz Beam, PharmD, [place], CPP Clinical Pharmacist [facility] at [facility] [phone]       Please call the care guide team at [phone] if you need to cancel or reschedule your appointment.   Patient verbalizes understanding of instructions provided today and agrees to view in MyChart.

## 2021-07-27 NOTE — Chronic Care Management (AMB) (Signed)
Chronic Care Management CCM Pharmacy Note  07/27/2021 Name:  Andrew Holloway. MRN:  [id number] DOB:  07-25-1971  Summary: - Tolerating regimen well. Glucose overall controlled except for some post prandial readings  Recommendations/Changes made from today's visit: - Continue current regimen at this time  Subjective: Andrew Holloway. is an 50 y.o. year old male who is a primary patient of Burnard Hawthorne, FNP.  The CCM team was consulted for assistance with disease management and care coordination needs.    Engaged with patient by telephone for follow up visit for pharmacy case management and/or care coordination services.   Objective:  Medications Reviewed Today     Reviewed by De Hollingshead, RPH-CPP (Pharmacist) on 07/27/21 at 1129  Med List Status: <None>   Medication Order Taking? Sig Documenting Provider Last Dose Status Informant  APPLE CIDER VINEGAR PO [id number]  Take 1 tablet by mouth daily. [provider]  Active   blood glucose meter kit and supplies [id number]  Dispense based on patient and insurance preference. Use up to four times daily as directed. (FOR ICD-10 E10.9, E11.9). [name], FNP  Active   glucose blood (ACCU-CHEK GUIDE) test strip [id number]  Use to check blood sugar twice daily Burnard Hawthorne, FNP  Active   metFORMIN (GLUCOPHAGE) 1000 MG tablet [id number] Yes Take 1 tablet (1,000 mg total) by mouth 2 (two) times daily with a meal. Burnard Hawthorne, FNP Taking Active   rosuvastatin (CRESTOR) 10 MG tablet [id number] Yes TAKE 1 TABLET(10 MG) BY MOUTH DAILY Burnard Hawthorne, FNP Taking Active             Pertinent Labs:   Lab Results  Component Value Date   HGBA1C 8.9 (A) 01/24/2021   Lab Results  Component Value Date   CHOL 101 06/07/2020   HDL 35.30 (L) 06/07/2020   LDLCALC 42 06/07/2020   LDLDIRECT 103.0 06/16/2019   TRIG 115.0 06/07/2020   CHOLHDL 3 06/07/2020   Lab Results  Component Value Date    CREATININE 0.86 06/07/2020   BUN 13 06/07/2020   NA 138 06/07/2020   K 4.1 06/07/2020   CL 102 06/07/2020   CO2 26 06/07/2020    SDOH:  (Social Determinants of Health) assessments and interventions performed:  SDOH Interventions    Flowsheet Row Most Recent Value  SDOH Interventions   Financial Strain Interventions Intervention Not Indicated       CCM Care Plan  Review of patient past medical history, allergies, medications, health status, including review of consultants reports, laboratory and other test data, was performed as part of comprehensive evaluation and provision of chronic care management services.   Care Plan : Medication Management  Updates made by De Hollingshead, RPH-CPP since 07/27/2021 12:00 AM     Problem: Diabetes, HLD      Long-Range Goal: Disease Progression Prevention   Recent Progress: On track  Priority: High  Note:   Current Barriers:  Unable to maintain control of diabetes  Pharmacist Clinical Goal(s):  Over the next 90 days, patient will achieve control of diabetes as evidenced by A1c  through collaboration with PharmD and provider.   Interventions: 1:1 collaboration with Burnard Hawthorne, FNP regarding development and update of comprehensive plan of care as evidenced by provider attestation and co-signature Inter-disciplinary care team collaboration (see longitudinal plan of care) Comprehensive medication review performed; medication list updated in electronic medical record  Health Maintenance   Yearly diabetic eye exam: due -  previously recommended to schedule Yearly diabetic foot exam: up to date Urine microalbumin: due - ordered Yearly influenza vaccination: due  - previously recommended to pursue, though patient has historically not received.  Td/Tdap vaccination: up to date Pneumonia vaccination: due  - previously recommended  COVID vaccinations: due - recommended bivalent booster Shingrix vaccinations: due - previously  recommended Colonoscopy: up to date  Diabetes: Uncontrolled; current treatment: metformin 1000 mg BID Previously tried - Rybelsus, copay was too expensive due to high deductible health plan Current meal pattern: Intermittent fasting from ~ 4 pm to 7 am next day; breakfast: eggs, yogurt; lunch: grilled chicken, salad, avocados, olives; maybe 1-2 times a week he'll have a bigger take out meal. Focusing on higher protein, lower carb options.  Current exercise patterns: at least 10,000 steps daily, integrated strength training 2 times weekly; walking the dogs at least twice daily.  Current glucose readings: fasting: ~120s; post prandials: some are over 200 Encouraged to continue current regimen at this time. Follow up with PCP at upcoming visit for lab work.  Plans to continue current high deductible insurance plan in 2023. Limits access of brand name medications.   Hyperlipidemia: Controlled per last lipid panel; current treatment: rosuvastatin 10 mg daily  Previously recommended to continue current regimen at this time  Patient Goals/Self-Care Activities Over the next 90 days, patient will:  - take medications as prescribed check glucose twice daily, document, and provide at future appointments target a minimum of 150 minutes of moderate intensity exercise weekly      Plan: video visit scheduled in 3 months   [name], PharmD, [place], CPP Clinical Pharmacist [employer] at [employer] [phone]

## 2021-08-19 ENCOUNTER — Ambulatory Visit: Payer: BC Managed Care – PPO | Admitting: Family

## 2021-09-26 ENCOUNTER — Other Ambulatory Visit: Payer: Self-pay

## 2021-09-26 ENCOUNTER — Ambulatory Visit (INDEPENDENT_AMBULATORY_CARE_PROVIDER_SITE_OTHER): Payer: BC Managed Care – PPO | Admitting: Family

## 2021-09-26 ENCOUNTER — Telehealth: Payer: Self-pay | Admitting: Family

## 2021-09-26 ENCOUNTER — Encounter: Payer: Self-pay | Admitting: Family

## 2021-09-26 VITALS — BP 128/78 | HR 98 | Temp 98.3°F | Ht 65.0 in | Wt 204.6 lb

## 2021-09-26 DIAGNOSIS — E119 Type 2 diabetes mellitus without complications: Secondary | ICD-10-CM | POA: Diagnosis not present

## 2021-09-26 DIAGNOSIS — E1165 Type 2 diabetes mellitus with hyperglycemia: Secondary | ICD-10-CM | POA: Diagnosis not present

## 2021-09-26 DIAGNOSIS — Z125 Encounter for screening for malignant neoplasm of prostate: Secondary | ICD-10-CM | POA: Diagnosis not present

## 2021-09-26 DIAGNOSIS — Z0000 Encounter for general adult medical examination without abnormal findings: Secondary | ICD-10-CM

## 2021-09-26 DIAGNOSIS — Z23 Encounter for immunization: Secondary | ICD-10-CM

## 2021-09-26 DIAGNOSIS — E785 Hyperlipidemia, unspecified: Secondary | ICD-10-CM

## 2021-09-26 LAB — POCT GLYCOSYLATED HEMOGLOBIN (HGB A1C): Hemoglobin A1C: 9.9 % — AB (ref 4.0–5.6)

## 2021-09-26 MED ORDER — OZEMPIC (0.25 OR 0.5 MG/DOSE) 2 MG/1.5ML ~~LOC~~ SOPN: PEN_INJECTOR | SUBCUTANEOUS | 3 refills | Status: DC

## 2021-09-26 MED ORDER — ACCU-CHEK GUIDE VI STRP: ORAL_STRIP | 3 refills | Status: AC

## 2021-09-26 NOTE — Telephone Encounter (Addendum)
Pt called in stating that the pharmacy had spoke with Pt wife about medication (Semaglutide,0.25 or 0.5MG /DOS, (OZEMPIC, 0.25 OR 0.5 MG/DOSE,) 2 MG/1.5ML SOPN). Pharmacy advise pt wife that medication cost is $900.00. Pt stated that is too expensive. Asked Pt if that was the price is with the copay card.Pt doesn't know.  Pt requesting callback

## 2021-09-26 NOTE — Assessment & Plan Note (Signed)
Chronic, well controlled.  LDL less than 50.  Continue rosuvastatin 10 mg

## 2021-09-26 NOTE — Patient Instructions (Addendum)
Start ozempic 0.25mg  once per week once per week injected subcutaneously ( [place])  in stomach. Please clean with alcohol swab prior to injection and be sure to rotate site. You may schedule a nurse visit if you would like to first injection.   After 4 weeks, and if tolerated and weight loss has not reached 1-2 lbs per week, please increase to 0.5mg  once per week .    Please read information on medication below and remember black box warning that you may not take if you or a family member is diagnosed with thyroid cancer (medullary thyroid cancer), or multiple endocrine neoplasia ( MEN).       Semaglutide injection solution What is this medicine? SEMAGLUTIDE (Sem a GLOO tide) is used to improve blood sugar control in adults with type 2 diabetes. This medicine may be used with other diabetes medicines. This drug may also reduce the risk of heart attack or stroke if you have type 2 diabetes and risk factors for heart disease. This medicine may be used for other purposes; ask your health care provider or pharmacist if you have questions. COMMON BRAND NAME(S): OZEMPIC What should I tell my health care provider before I take this medicine? They need to know if you have any of these conditions: endocrine tumors (MEN 2) or if someone in your family had these tumors eye disease, vision problems history of pancreatitis kidney disease stomach problems thyroid cancer or if someone in your family had thyroid cancer an unusual or allergic reaction to semaglutide, other medicines, foods, dyes, or preservatives pregnant or trying to get pregnant breast-feeding How should I use this medicine? This medicine is for injection under the skin of your upper leg (thigh), stomach area, or upper arm. It is given once every week (every 7 days). You will be taught how to prepare and give this medicine. Use exactly as directed. Take your medicine at regular intervals. Do not take it more often than directed. If you use  this medicine with insulin, you should inject this medicine and the insulin separately. Do not mix them together. Do not give the injections right next to each other. Change (rotate) injection sites with each injection. It is important that you put your used needles and syringes in a special sharps container. Do not put them in a trash can. If you do not have a sharps container, call your pharmacist or healthcare provider to get one. A special MedGuide will be given to you by the pharmacist with each prescription and refill. Be sure to read this information carefully each time. This drug comes with INSTRUCTIONS FOR USE. Ask your pharmacist for directions on how to use this drug. Read the information carefully. Talk to your pharmacist or health care provider if you have questions. Talk to your pediatrician regarding the use of this medicine in children. Special care may be needed. Overdosage: If you think you have taken too much of this medicine contact a poison control center or emergency room at once. NOTE: This medicine is only for you. Do not share this medicine with others. What if I miss a dose? If you miss a dose, take it as soon as you can within 5 days after the missed dose. Then take your next dose at your regular weekly time. If it has been longer than 5 days after the missed dose, do not take the missed dose. Take the next dose at your regular time. Do not take double or extra doses. If you have questions  about a missed dose, contact your health care provider for advice. What may interact with this medicine? other medicines for diabetes Many medications may cause changes in blood sugar, these include: alcohol containing beverages antiviral medicines for HIV or AIDS aspirin and aspirin-like drugs certain medicines for blood pressure, heart disease, irregular heart beat chromium diuretics male hormones, such as estrogens or progestins, birth control  pills fenofibrate gemfibrozil isoniazid lanreotide male hormones or anabolic steroids MAOIs like Carbex, Eldepryl, Marplan, Nardil, and Parnate medicines for weight loss medicines for allergies, asthma, cold, or cough medicines for depression, anxiety, or psychotic disturbances niacin nicotine NSAIDs, medicines for pain and inflammation, like ibuprofen or naproxen octreotide pasireotide pentamidine phenytoin probenecid quinolone antibiotics such as ciprofloxacin, levofloxacin, ofloxacin some herbal dietary supplements steroid medicines such as prednisone or cortisone sulfamethoxazole; trimethoprim thyroid hormones Some medications can hide the warning symptoms of low blood sugar (hypoglycemia). You may need to monitor your blood sugar more closely if you are taking one of these medications. These include: beta-blockers, often used for high blood pressure or heart problems (examples include atenolol, metoprolol, propranolol) clonidine guanethidine reserpine This list may not describe all possible interactions. Give your health care provider a list of all the medicines, herbs, non-prescription drugs, or dietary supplements you use. Also tell them if you smoke, drink alcohol, or use illegal drugs. Some items may interact with your medicine. What should I watch for while using this medicine? Visit your doctor or health care professional for regular checks on your progress. Drink plenty of fluids while taking this medicine. Check with your doctor or health care professional if you get an attack of severe diarrhea, nausea, and vomiting. The loss of too much body fluid can make it dangerous for you to take this medicine. A test called the HbA1C (A1C) will be monitored. This is a simple blood test. It measures your blood sugar control over the last 2 to 3 months. You will receive this test every 3 to 6 months. Learn how to check your blood sugar. Learn the symptoms of low and high blood  sugar and how to manage them. Always carry a quick-source of sugar with you in case you have symptoms of low blood sugar. Examples include hard sugar candy or glucose tablets. Make sure others know that you can choke if you eat or drink when you develop serious symptoms of low blood sugar, such as seizures or unconsciousness. They must get medical help at once. Tell your doctor or health care professional if you have high blood sugar. You might need to change the dose of your medicine. If you are sick or exercising more than usual, you might need to change the dose of your medicine. Do not skip meals. Ask your doctor or health care professional if you should avoid alcohol. Many nonprescription cough and cold products contain sugar or alcohol. These can affect blood sugar. Pens should never be shared. Even if the needle is changed, sharing may result in passing of viruses like hepatitis or HIV. Wear a medical ID bracelet or chain, and carry a card that describes your disease and details of your medicine and dosage times. Do not become pregnant while taking this medicine. Women should inform their doctor if they wish to become pregnant or think they might be pregnant. There is a potential for serious side effects to an unborn child. Talk to your health care professional or pharmacist for more information. What side effects may I notice from receiving this medicine? Side effects  that you should report to your doctor or health care professional as soon as possible: allergic reactions like skin rash, itching or hives, swelling of the face, lips, or tongue breathing problems changes in vision diarrhea that continues or is severe lump or swelling on the neck severe nausea signs and symptoms of infection like fever or chills; cough; sore throat; pain or trouble passing urine signs and symptoms of low blood sugar such as feeling anxious, confusion, dizziness, increased hunger, unusually weak or tired,  sweating, shakiness, cold, irritable, headache, blurred vision, fast heartbeat, loss of consciousness signs and symptoms of kidney injury like trouble passing urine or change in the amount of urine trouble swallowing unusual stomach upset or pain vomiting Side effects that usually do not require medical attention (report to your doctor or health care professional if they continue or are bothersome): constipation diarrhea nausea pain, redness, or irritation at site where injected stomach upset This list may not describe all possible side effects. Call your doctor for medical advice about side effects. You may report side effects to FDA at 1-800-FDA-1088. Where should I keep my medicine? Keep out of the reach of children. Store unopened pens in a refrigerator between 2 and 8 degrees C (36 and 46 degrees F). Do not freeze. Protect from light and heat. After you first use the pen, it can be stored for 56 days at room temperature between 15 and 30 degrees C (59 and 86 degrees F) or in a refrigerator. Throw away your used pen after 56 days or after the expiration date, whichever comes first. Do not store your pen with the needle attached. If the needle is left on, medicine may leak from the pen. NOTE: This sheet is a summary. It may not cover all possible information. If you have questions about this medicine, talk to your doctor, pharmacist, or health care provider.  2021 Elsevier/Gold Standard (2019-04-08 09:41:51)  Goal of fasting blood sugar is between 70-120. If in this range, we are reaching our target a1c ( goal 6.5%)   Please check fasting blood sugar in the morning time once or twice a week.  You may also check if you feel like you are having a low episode or particularly high episode of blood sugar.  If blood sugars increase, I may advise you to check blood sugar after your largest meal.  You specifically do this TWO hours after largest meal with the goal of being less than 180.  If blood  sugar is checked sooner than 2 hours after largest meal, and it will be  expected to be elevated. You must wait 2 hours.   If your blood sugar is less than 180 hours after your largest meal, again we are reaching our target a1c goal   Call [facility] clinic if: BG < 70 or > 300.   If you have any symptoms of low blood sugar ( sweating, shakiness, lightheaded, dizzy) that you notify me. If you have a low, please drink a glass of orange juice and recheck blood sugar every 5 minutes until you dont feel symptomatic AND blood sugar is above 80.   Nice to see you!

## 2021-09-26 NOTE — Assessment & Plan Note (Addendum)
Lab Results  Component Value Date   HGBA1C 9.9 (A) 09/26/2021   Uncontrolled.  Continue metformin 1000 mg twice daily.  He had tolerated rybelus well in the past; insurance didn't cover. Start ozempic. Provided sample to start and 0.5mg  prescription sent. CMA Donney Dice provided education regarding site administration and pen. Advised to monitor FBG and PP to ensure we are heading in the right direction.

## 2021-09-26 NOTE — Progress Notes (Signed)
Subjective:    Patient ID: Andrew Loma., male    DOB: [birth year], 51 y.o.   MRN: [id number]  CC: Andrew Basu Tarpley Brooke Bonito. is a 51 y.o. male who presents today for follow up.   HPI: Feels well today.  No new complaints   he is feeling great today ' tons better'.  He has focused on his diet and limiting carbs.  He is lost inches off of his waist size He drinks zero sugar drinks.   Going to gym 3 x per week.   No prednisone use, dysuria, wounds  Denies increased urination, urinary hesitancy, decrease in urination  DM -compliant with metformin 1000 mg twice daily. No personal or family h/o thyroid cancer  Rybelsus previously too expensive by insurance  HLD- compliant with rosuvastatin 10 mg HISTORY:  Past Medical History:  Diagnosis Date   Chicken pox    Diabetes mellitus, type 2 ([place])    Kidney stones    Motion sickness    back seat of car   Umbilical hernia 08/07/7508   Wears contact lenses    Past Surgical History:  Procedure Laterality Date   ANKLE SURGERY     x2   COLONOSCOPY WITH PROPOFOL N/A 05/03/2020   Procedure: COLONOSCOPY WITH PROPOFOL;  Surgeon: Lucilla Lame, MD;  Location: [facility];  Service: Endoscopy;  Laterality: N/A;  Diabetic - oral meds priority 4   EPIGASTRIC HERNIA REPAIR N/A 03/30/2015   Procedure: HERNIA REPAIR EPIGASTRIC ADULT;  Surgeon: Robert Bellow, MD;  Location: [facility];  Service: General;  Laterality: N/A;   HERNIA REPAIR  2585   umbilical hernia   HERNIA REPAIR  03/30/2015   Epigastric and umbilical hernia with retrorectus 6.4 cm Ventralex mesh   left and right ankle repair ligament     nasal spectrum     rotator cuff right     SHOULDER SURGERY     rotator cuff   Family History  Problem Relation Age of Onset   Hypertension Mother    Hyperlipidemia Father    Leukemia Father    Diabetes Maternal Grandmother    Heart disease Maternal Grandfather    Hyperlipidemia Maternal Grandfather    Heart disease Paternal  Grandfather    Hyperlipidemia Paternal Grandfather    Thyroid cancer Neg Hx     Allergies: Patient has no known allergies. Current Outpatient Medications on File Prior to Visit  Medication Sig Dispense Refill   APPLE CIDER VINEGAR PO Take 1 tablet by mouth daily.     blood glucose meter kit and supplies Dispense based on patient and insurance preference. Use up to four times daily as directed. (FOR ICD-10 E10.9, E11.9). 1 each 0   metFORMIN (GLUCOPHAGE) 1000 MG tablet Take 1 tablet (1,000 mg total) by mouth 2 (two) times daily with a meal. 180 tablet 3   rosuvastatin (CRESTOR) 10 MG tablet TAKE 1 TABLET(10 MG) BY MOUTH DAILY 90 tablet 3   No current facility-administered medications on file prior to visit.    Social History   Tobacco Use   Smoking status: Never   Smokeless tobacco: Never  Vaping Use   Vaping Use: Never used  Substance Use Topics   Alcohol use: Yes    Alcohol/week: 0.0 standard drinks    Comment: 2x month   Drug use: No    Review of Systems  Constitutional:  Negative for chills and fever.  Respiratory:  Negative for cough.   Cardiovascular:  Negative for chest pain  and palpitations.  Gastrointestinal:  Negative for nausea and vomiting.  Genitourinary:  Negative for difficulty urinating.     Objective:    BP 128/78 (BP Location: Left Arm, Patient Position: Sitting, Cuff Size: Normal)    Pulse 98    Temp 98.3 F (36.8 C) (Oral)    Ht _0  (1.651 m)    Wt 204 lb 9.6 oz (92.8 kg)    SpO2 98%    BMI 34.05 kg/m  BP Readings from Last 3 Encounters:  09/26/21 128/78  01/24/21 120/82  10/22/20 124/90   Wt Readings from Last 3 Encounters:  09/26/21 204 lb 9.6 oz (92.8 kg)  01/24/21 205 lb (93 kg)  10/22/20 205 lb 3.2 oz (93.1 kg)    Physical Exam Vitals reviewed.  Constitutional:      Appearance: He is well-developed.  Cardiovascular:     Rate and Rhythm: Regular rhythm.     Heart sounds: Normal heart sounds.  Pulmonary:     Effort: Pulmonary effort  is normal. No respiratory distress.     Breath sounds: Normal breath sounds. No wheezing, rhonchi or rales.  Skin:    General: Skin is warm and dry.  Neurological:     Mental Status: He is alert.  Psychiatric:        Speech: Speech normal.        Behavior: Behavior normal.       Assessment & Plan:   Problem List Items Addressed This Visit       Endocrine   Diabetes mellitus without complication ([place])    Lab Results  Component Value Date   HGBA1C 9.9 (A) 09/26/2021  Uncontrolled.  Continue metformin 1000 mg twice daily.  He had tolerated rybelus well in the past; insurance didn't cover. Start ozempic. Provided sample to start and 0.63m prescription sent. [place] education regarding site administration and pen. Advised to monitor FBG and PP to ensure we are heading in the right direction.         Relevant Medications   Semaglutide,0.25 or 0.5MG/DOS, (OZEMPIC, 0.25 OR 0.5 MG/DOSE,) 2 MG/1.5ML SOPN     Other   HLD (hyperlipidemia)    Chronic, well controlled.  LDL less than 50.  Continue rosuvastatin 10 mg      Other Visit Diagnoses     Type 2 diabetes mellitus with hyperglycemia, without long-term current use of insulin ([facility])    -  Primary   Relevant Medications   Semaglutide,0.25 or 0.5MG/DOS, (OZEMPIC, 0.25 OR 0.5 MG/DOSE,) 2 MG/1.5ML SOPN   Other Relevant Orders   POCT HgB A1C (Completed)   Comprehensive metabolic panel   CBC with Differential/Platelet   Urine Microalbumin w/creat. ratio   Routine adult health maintenance       Screening for prostate cancer       Relevant Orders   PSA   Need for shingles vaccine       Relevant Orders   Varicella-zoster vaccine IM (Shingrix) (Completed)        I am having Andrew Bushy Savich Jr. "SONNY" start on Ozempic (0.25 or 0.5 MG/DOSE). I am also having him maintain his blood glucose meter kit and supplies, rosuvastatin, APPLE CIDER VINEGAR PO, and metFORMIN.   Meds ordered this encounter  Medications    Semaglutide,0.25 or 0.5MG/DOS, (OZEMPIC, 0.25 OR 0.5 MG/DOSE,) 2 MG/1.5ML SOPN    Sig: After 4 weeks on 0.271m start 0.8m64mc qwk.    Dispense:  3 mL    Refill:  3  Order Specific Question:   Supervising Provider    Answer:   Crecencio Mc [[id number]]    Return precautions given.   Risks, benefits, and alternatives of the medications and treatment plan prescribed today were discussed, and patient expressed understanding.   Education regarding symptom management and diagnosis given to patient on AVS.  Continue to follow with Burnard Hawthorne, FNP for routine health maintenance.   Andrew Bushy Mcneece Jr. and I agreed with plan.   [name], FNP

## 2021-09-27 NOTE — Telephone Encounter (Signed)
Andrew Holloway I had asked patient to go to [facility] [employer].  Can you call and ask him?  There may be coupon that we can take advantage of  [name] , any other ideas?

## 2021-09-27 NOTE — Telephone Encounter (Signed)
Agree, would ensure Ozempic savings card is being used. However, that will only take off a maximum of $150 per 28 day supply (unclear what day supply pharmacy is running, since the quantity sent yesterday is 2 pens, a 56 day supply).   If card is being used, there are no other options for a high deductible health plan situation, unfortunately, besides him paying through the deductible.   May be more affordable to prescribe basal insulin, some plans cover for a flat rate and/or there are savings cards (on [place] website, for example) that bring copay down to $99 per month or [id number] per a certain quantity.

## 2021-09-27 NOTE — Telephone Encounter (Signed)
Pt is using [facility] & was told by pharmacy as well as patient that cost is due to his high deductible.

## 2021-09-27 NOTE — Telephone Encounter (Signed)
I called pharmacy & patient actually has a high deductible plan. It isn't that medication is not covered. Until he meets deductible Ozempic is $750. Please advise on options?

## 2021-09-27 NOTE — Telephone Encounter (Signed)
Pt has & used. That was the cost $750 with the copay card.

## 2021-10-02 NOTE — Telephone Encounter (Signed)
Pt has ozempic sample currently Andrew Holloway, advise he may complete sample   [name], agree with basal insulin. When you see him 10/11/21, do you want to discuss?   [name], please let pt know the above

## 2021-10-03 NOTE — Telephone Encounter (Signed)
I called and spoke with patient to make him aware that he should finish the Ozempic & [name] will be discussing basal insulin with him on VV 3/7.

## 2021-10-03 NOTE — Telephone Encounter (Signed)
See below Ensure pt is called

## 2021-10-11 ENCOUNTER — Telehealth: Payer: BC Managed Care – PPO | Admitting: Pharmacist

## 2021-10-11 VITALS — Wt 195.0 lb

## 2021-10-11 DIAGNOSIS — E119 Type 2 diabetes mellitus without complications: Secondary | ICD-10-CM

## 2021-10-11 NOTE — Patient Instructions (Addendum)
Andrew Holloway,  ? ?Check your blood sugars twice daily:  ?1) Fasting, first thing in the morning before breakfast and  ?2) 2 hours after your largest meal.  ? ?For a goal A1c of less than 7%, goal fasting readings are less than 130 and goal 2 hour after meal readings are less than 180.  ? ?Moving forward, the options I would recommend talking to Andrew Holloway about are:  ? ?1) Continuing Ozempic and paying through your insurance deductible. This medication helps with weight loss, does not cause low blood sugars, and has been shown to reduce risk of heart attacks or strokes. ?2) Add a long acting dose of insulin (see if your insurance prefers any of the following options: Tresiba, Lantus, Basaglar, or an alternative). This medication can cause low blood sugars and can make it harder to lose weight, but should be more affordable than Ozempic ?3) Add a tablet called glipizide before breakfast and supper. This medication can cause low blood sugars and can make it harder to lose weight, but should be more affordable than Ozempic ? ?If it is any way possible, continuing the Ozempic would clinically be the strongest recommendation for you.  ? ?We could see about insurance coverage for the [facility] 3 continuous glucose monitor. It should be no more than $75 a month at the pharmacy. I have a sample I can give you to try for free first. The brand of adhesive that we usually recommend is called Tegaderm or Skin Grip - both can be purchased from [employer].  ? ?Take care! ? ?[name] Feliz Beam, PharmD ?

## 2021-10-11 NOTE — Progress Notes (Signed)
? ?   ? ?Chief Complaint  ?Patient presents with  ? Diabetes  ? ? ?Andrew Holloway. is a 51 y.o. year old male who was referred for medication management by their primary care provider, Arnett, Yvetta Coder, FNP. They presented for a virtual visit in the context of the COVID-19 pandemic. We were unable to connect so did a telephone visit  ? ? ?Subjective: ?Diabetes: ? ?Current medications: metformin 1000 mg twice daily, Ozempic 0.25 mg weekly (x 1 week) - reports high deductible insurance plan, copay for Ozempic would be $700 ? ?Current glucose readings: late afternoon readings: 180-200s ? ?Current dietary modifications: intermittent fasting; minimizing meal sizes. Reports he has been seeing weight loss recently, very proud of himself.  ? ?Denies any prior episodes of hypoglycemia. Discussed symptoms today. ? ?  ? ?Objective: ?Lab Results  ?Component Value Date  ? HGBA1C 9.9 (A) 09/26/2021  ? ? ?Lab Results  ?Component Value Date  ? CREATININE 0.86 06/07/2020  ? BUN 13 06/07/2020  ? NA 138 06/07/2020  ? K 4.1 06/07/2020  ? CL 102 06/07/2020  ? CO2 26 06/07/2020  ? ? ?Lab Results  ?Component Value Date  ? CHOL 101 06/07/2020  ? HDL 35.30 (L) 06/07/2020  ? [place] 42 06/07/2020  ? LDLDIRECT 103.0 06/16/2019  ? TRIG 115.0 06/07/2020  ? CHOLHDL 3 06/07/2020  ? ? ?Medications Reviewed Today   ? ? Reviewed by De Hollingshead, RPH-CPP (Pharmacist) on 10/11/21 at 1300  Med List Status: <None>  ? ?Medication Order Taking? Sig Documenting Provider Last Dose Status Informant  ?APPLE CIDER VINEGAR PO [id number] Yes Take 1 tablet by mouth daily. [provider] Taking Active   ?blood glucose meter kit and supplies [id number]  Dispense based on patient and insurance preference. Use up to four times daily as directed. (FOR ICD-10 E10.9, E11.9). [name], FNP  Active   ?glucose blood (ACCU-CHEK GUIDE) test strip [id number]  Use to check blood sugar twice daily Burnard Hawthorne, FNP  Active   ?metFORMIN  (GLUCOPHAGE) 1000 MG tablet [id number] Yes Take 1 tablet (1,000 mg total) by mouth 2 (two) times daily with a meal. Burnard Hawthorne, FNP Taking Active   ?rosuvastatin (CRESTOR) 10 MG tablet [id number] Yes TAKE 1 TABLET(10 MG) BY MOUTH DAILY Burnard Hawthorne, FNP Taking Active   ?Semaglutide,0.25 or 0.5MG/DOS, (OZEMPIC, 0.25 OR 0.5 MG/DOSE,) 2 MG/1.5ML SOPN [id number] No After 4 weeks on 0.62m, start 0.529msc qwk.  ?Patient not taking: Reported on 10/11/2021  ? ArBurnard HawthorneFNP Not Taking Active   ? ?  ?  ? ?  ? ? ?Assessment/Plan:  ? ?Diabetes: ?- Currently uncontrolled but anticipated to be improved ?- Reviewed long term cardiovascular and renal outcomes of uncontrolled blood sugar. Discussed use of CGM to allow for better monitoring of glucose, as copay should be no more that $75 a month. Patient will contemplate and discuss with his wife.  ?- Reviewed goal A1c, goal fasting, and goal 2 hour post prandial glucose ?- Discussed options moving forward. 1) Continue Ozempic and pay through deductible vs 2) add insurance preferred basal insulin; 3) addition of sulfonylurea. Patient elects to continue on Ozempic sample at this time and follow up with PCP in 4 weeks to discuss blood sugar control and options.  ?- He will discuss CGM with his wife and let usKoreanow if he would like a prescription sent to the pharmacy ? ?Follow Up Plan: follow up with PCP in  4 weeks ? ?[name], PharmD, BCACP, CPP ?Clinical Pharmacist ?Therapist, music at [employer] ?[phone] ? ? ? ?

## 2021-11-08 ENCOUNTER — Encounter: Payer: Self-pay | Admitting: Family

## 2021-11-08 ENCOUNTER — Telehealth (INDEPENDENT_AMBULATORY_CARE_PROVIDER_SITE_OTHER): Payer: BC Managed Care – PPO | Admitting: Family

## 2021-11-08 ENCOUNTER — Ambulatory Visit: Payer: BC Managed Care – PPO | Admitting: Family

## 2021-11-08 VITALS — BP 127/78 | HR 96 | Ht 65.0 in | Wt 193.0 lb

## 2021-11-08 DIAGNOSIS — E119 Type 2 diabetes mellitus without complications: Secondary | ICD-10-CM

## 2021-11-08 MED ORDER — EMPAGLIFLOZIN 10 MG PO TABS: 10.0000 mg | ORAL_TABLET | Freq: Every day | ORAL | 1 refills | Status: DC

## 2021-11-08 NOTE — Patient Instructions (Addendum)
Pick up sample of ozempic from our office.  You can increase to 1mg  injection once per week after 4 weeks on Ozempic 0.5mg . ? ?Let me know how much jardiance 10mg  cost and if it goes to your insurance.  If it does go through , I think this is a viable option to change from Ozempic to [place] ? ?We discussed starting a new medication called Jardiance today which protects her kidneys, prevents against cardiovascular disease and lowers your hemoglobin A1c.  It is very important in this medication that you read the below.  ? ? ?You should stop taking Jardiance ( SGLT2 inhibitor) and seek medical attention immediately if you have any symptoms of ketoacidosis, a serious condition in which the body produces high levels of blood acids called ketones.   ? ?Symptoms of ketoacidosis include nausea, vomiting, abdominal pain, tiredness, and trouble breathing.   ? ?You should also be alert for signs and symptoms of a urinary tract infection, such as a feeling of burning when urinating or the need to urinate often or right away; pain in the lower part of the stomach area or pelvis; fever; or blood in the urine.  ? ?Lastly, if you develop vomiting or diarrhea, such as with gastroenteritis, you should also hold Jardiance and call the office immediately.  As any volume depletion on this medication can cause ketoacidosis. ? ?Please hold Jardiance and contact me immediately if you are experiencing any of these symptoms above. ? ?Empagliflozin Oral Tablets ?What is this medication? ?EMPAGLIFLOZIN (EM pa gli FLOE zin) helps to treat type 2 diabetes. It helps to control blood sugar. Treatment is combined with diet and exercise. This drug may also reduce the risk of heart attack, stroke, or death if you have type 2 diabetes and risk factors for heart disease. It also treats heart failure. Itmay lower the risk for treatment of heart failure in the hospital. ?This medicine may be used for other purposes; ask your health care  provider orpharmacist if you have questions. ?COMMON BRAND NAME(S): Jardiance ?What should I tell my care team before I take this medication? ?They need to know if you have any of these conditions: ?dehydration ?diabetic ketoacidosis ?diet low in salt ?eating less due to illness, surgery, dieting, or any other reason ?having surgery ?high cholesterol ?high levels of potassium in the blood ?history of pancreatitis or pancreas problems ?history of yeast infection of the penis or vagina ?if you often drink alcohol ?infections in the bladder, kidneys, or urinary tract ?kidney disease ?liver disease ?low blood pressure ?on hemodialysis ?problems urinating ?type 1 diabetes ?uncircumcised male ?an unusual or allergic reaction to empagliflozin, other medicines, foods, dyes, or preservatives ?pregnant or trying to get pregnant ?breast-feeding ?How should I use this medication? ?Take this medicine by mouth with water. Take it as directed on the prescription label at the same time every day. You may take it with or without food. Keeptaking it unless your health care provider tells you to stop. ?A special MedGuide will be given to you by the pharmacist with eachprescription and refill. Be sure to read this information carefully each time. ?Talk to your health care provider about the use of this medicine in children.Special care may be needed. ?Overdosage: If you think you have taken too much of this medicine contact apoison control center or emergency room at once. ?NOTE: This medicine is only for you. Do not share this medicine with others. ?What if I miss a dose? ?If you miss a dose,  take it as soon as you can. If it is almost time for yournext dose, take only that dose. Do not take double or extra doses. ?What may interact with this medication? ?alcohol ?diuretics ?insulin ?This list may not describe all possible interactions. Give your health care provider a list of all the medicines, herbs, non-prescription drugs, or  dietary supplements you use. Also tell them if you smoke, drink alcohol, or use illegaldrugs. Some items may interact with your medicine. ?What should I watch for while using this medication? ?Visit your health care provider for regular checks on your progress. Tell your health care provider if your symptoms do not start to get better or if they getworse. ?This medicine can cause a serious condition in which there is too much acid in the blood. If you develop nausea, vomiting, stomach pain, unusual tiredness, or breathing problems, stop taking this medicine and call your doctor right away.If possible, use a ketone dipstick to check for ketones in your urine. ?Check with your health care provider if you have severe diarrhea, nausea, and vomiting, or if you sweat a lot. The loss of too much body fluid may make itdangerous for you to take this medicine. ?A test called the HbA1C (A1C) will be monitored. This is a simple blood test. It measures your blood sugar control over the last 2 to 3 months. You willreceive this test every 3 to 6 months. ?Learn how to check your blood sugar. Learn the symptoms of low and high bloodsugar and how to manage them. ?Always carry a quick-source of sugar with you in case you have symptoms of low blood sugar. Examples include hard sugar candy or glucose tablets. Make sure others know that you can choke if you eat or drink when you develop serious symptoms of low blood sugar, such as seizures or unconsciousness. Get medicalhelp at once. ?Tell your health care provider if you have high blood sugar. You might need to change the dose of your medicine. If you are sick or exercising more thanusual, you may need to change the dose of your medicine. ?What side effects may I notice from receiving this medication? ?Side effects that you should report to your doctor or health care professionalas soon as possible: ?allergic reactions (skin rash, itching or hives, swelling of the face, lips, or  tongue) ?breathing problems ?dizziness ?feeling faint or lightheaded, falls ?genital infection (fever; tenderness, redness, or swelling in the genitals or area from the genitals to the back of the rectum) ?kidney injury (trouble passing urine or change in the amount of urine) ?low blood sugar (feeling anxious; confusion; dizziness; increased hunger; unusually weak or tired; increased sweating; shakiness; cold, clammy skin; irritable; headache; blurred vision; fast heartbeat; loss of consciousness) ?muscle weakness ?nausea, vomiting, unusual stomach upset or pain ?new pain or tenderness, change in skin color, sores or ulcers, or infection in legs or feet ?penile discharge, itching, or pain ?unusual tiredness ?unusual vaginal discharge, itching, or odor ?urinary tract infection (fever; chills; a burning feeling when urinating; urgent need to urinate more often; blood in the urine; back pain) ?Side effects that usually do not require medical attention (report to yourdoctor or health care professional if they continue or are bothersome): ?mild increase in urination ?thirsty ?This list may not describe all possible side effects. Call your doctor for medical advice about side effects. You may report side effects to FDA at1-800-FDA-1088. ?Where should I keep my medication? ?Keep out of the reach of children and pets. ?Store at room  temperature between 20 and 25 degrees C (68 and 77 degrees F).Get rid of any unused medicine after the expiration date. ?To get rid of medicines that are no longer needed or have expired: ?Take the medicine to a medicine take-back program. Check with your pharmacy or law enforcement to find a location. ?If you cannot return the medicine, check the label or package insert to see if the medicine should be thrown out in the garbage or flushed down the toilet. If you are not sure, ask your health care provider. If it is safe to put it in the trash, take the medicine out of the container. Mix the  medicine with cat litter, dirt, coffee grounds, or other unwanted substance. Seal the mixture in a bag or container. Put it in the trash. ?NOTE: This sheet is a summary. It may not cover all possible information. If

## 2021-11-08 NOTE — Progress Notes (Signed)
Virtual Visit via Video Note ? ?I connected with@ ? on 11/08/21 at 11:30 AM EDT by a video enabled telemedicine application and verified that I am speaking with the correct person using two identifiers. ? Location patient: home ?Location provider:work  ?Persons participating in the virtual visit: patient, provider ? ?I discussed the limitations of evaluation and management by telemedicine and the availability of in person appointments. The patient expressed understanding and agreed to proceed. ? ? ?HPI: ? ?Diabetic follow-up ?He feels well today and feels blood sugars are improving ?Compliant with metformin 1000 mg twice daily, Ozempic 0.5 mg weekly x 4 weeks . No nausea, constipation.  ?FBG 110-135 ?Post prandial 197 ?He didn't fill a CGM ? ? ?ROS: See pertinent positives and negatives per HPI. ? ? ? ?EXAM: ? ?VITALS per patient if applicable: ?BP 127/78   Pulse 96   Ht 5\' 5"  (1.651 m)   Wt 193 lb (87.5 kg)   BMI 32.12 kg/m?  ?BP Readings from Last 3 Encounters:  ?11/08/21 127/78  ?09/26/21 128/78  ?01/24/21 120/82  ? ?Wt Readings from Last 3 Encounters:  ?11/08/21 193 lb (87.5 kg)  ?10/11/21 195 lb (88.5 kg)  ?09/26/21 204 lb 9.6 oz (92.8 kg)  ? ? ?GENERAL: alert, oriented, appears well and in no acute distress ? ?HEENT: atraumatic, conjunttiva clear, no obvious abnormalities on inspection of external nose and ears ? ?NECK: normal movements of the head and neck ? ?LUNGS: on inspection no signs of respiratory distress, breathing rate appears normal, no obvious gross SOB, gasping or wheezing ? ?CV: no obvious cyanosis ? ?MS: moves all visible extremities without noticeable abnormality ? ?PSYCH/NEURO: pleasant and cooperative, no obvious depression or anxiety, speech and thought processing grossly intact ? ?ASSESSMENT AND PLAN: ? ?Discussed the following assessment and plan: ? ?Problem List Items Addressed This Visit   ? ?  ? Endocrine  ? Diabetes mellitus without complication (HCC) - Primary  ?  Improving. a1c  due in ~ 6 weeks at follow up.  ?Insurance doesn't cover ozempic and discussed at some point will need to transition from reliance on samples from our office which he understands. He will increase ozempic to 1mg  ( provided another sample) to last him until follow up in which likely transition to jardiance I have sent jardiance 10mg   today to see if covered by insurance and counseled on mechanism of action, side effects of medication. If covered, we will wait to start until our follow up appointment next month. Continue metformin 1000mg  BID.   ?  ?  ? Relevant Medications  ? empagliflozin (JARDIANCE) 10 MG TABS tablet  ? ? ?-we discussed possible serious and likely etiologies, options for evaluation and workup, limitations of telemedicine visit vs in person visit, treatment, treatment risks and precautions. Pt prefers to treat via telemedicine empirically rather then risking or undertaking an in person visit at this moment.  ?. ?  ?I discussed the assessment and treatment plan with the patient. The patient was provided an opportunity to ask questions and all were answered. The patient agreed with the plan and demonstrated an understanding of the instructions. ?  ?The patient was advised to call back or seek an in-person evaluation if the symptoms worsen or if the condition fails to improve as anticipated. ? ? ?09/28/21, FNP  ?

## 2021-11-08 NOTE — Assessment & Plan Note (Signed)
Improving. a1c due in ~ 6 weeks at follow up.  ?Insurance doesn't cover ozempic and discussed at some point will need to transition from reliance on samples from our office which he understands. He will increase ozempic to 1mg  ( provided another sample) to last him until follow up in which likely transition to jardiance I have sent jardiance 10mg   today to see if covered by insurance and counseled on mechanism of action, side effects of medication. If covered, we will wait to start until our follow up appointment next month. Continue metformin 1000mg  BID.   ?

## 2021-11-09 ENCOUNTER — Other Ambulatory Visit: Payer: Self-pay | Admitting: Family

## 2021-11-09 ENCOUNTER — Encounter: Payer: Self-pay | Admitting: Family

## 2021-11-09 DIAGNOSIS — E119 Type 2 diabetes mellitus without complications: Secondary | ICD-10-CM

## 2021-12-01 LAB — HM DIABETES EYE EXAM

## 2021-12-02 ENCOUNTER — Encounter: Payer: Self-pay | Admitting: Family

## 2021-12-26 ENCOUNTER — Encounter: Payer: Self-pay | Admitting: Family

## 2021-12-26 ENCOUNTER — Ambulatory Visit (INDEPENDENT_AMBULATORY_CARE_PROVIDER_SITE_OTHER): Payer: BC Managed Care – PPO | Admitting: Family

## 2021-12-26 VITALS — BP 118/84 | HR 81 | Temp 98.1°F | Ht 65.0 in | Wt 199.0 lb

## 2021-12-26 DIAGNOSIS — Z125 Encounter for screening for malignant neoplasm of prostate: Secondary | ICD-10-CM

## 2021-12-26 DIAGNOSIS — Z23 Encounter for immunization: Secondary | ICD-10-CM

## 2021-12-26 DIAGNOSIS — E119 Type 2 diabetes mellitus without complications: Secondary | ICD-10-CM | POA: Diagnosis not present

## 2021-12-26 DIAGNOSIS — E785 Hyperlipidemia, unspecified: Secondary | ICD-10-CM

## 2021-12-26 LAB — CBC WITH DIFFERENTIAL/PLATELET
Basophils Absolute: 0 10*3/uL (ref 0.0–0.1)
Basophils Relative: 0.5 % (ref 0.0–3.0)
Eosinophils Absolute: 0.1 10*3/uL (ref 0.0–0.7)
Eosinophils Relative: 1.3 % (ref 0.0–5.0)
HCT: 49.4 % (ref 39.0–52.0)
Hemoglobin: 16.7 g/dL (ref 13.0–17.0)
Lymphocytes Relative: 38.3 % (ref 12.0–46.0)
Lymphs Abs: 2.2 10*3/uL (ref 0.7–4.0)
MCHC: 33.9 g/dL (ref 30.0–36.0)
MCV: 88.8 fl (ref 78.0–100.0)
Monocytes Absolute: 0.5 10*3/uL (ref 0.1–1.0)
Monocytes Relative: 8.4 % (ref 3.0–12.0)
Neutro Abs: 3 10*3/uL (ref 1.4–7.7)
Neutrophils Relative %: 51.5 % (ref 43.0–77.0)
Platelets: 266 10*3/uL (ref 150.0–400.0)
RBC: 5.57 Mil/uL (ref 4.22–5.81)
RDW: 14.1 % (ref 11.5–15.5)
WBC: 5.7 10*3/uL (ref 4.0–10.5)

## 2021-12-26 LAB — COMPREHENSIVE METABOLIC PANEL
ALT: 38 U/L (ref 0–53)
AST: 24 U/L (ref 0–37)
Albumin: 4.7 g/dL (ref 3.5–5.2)
Alkaline Phosphatase: 42 U/L (ref 39–117)
BUN: 13 mg/dL (ref 6–23)
CO2: 30 mEq/L (ref 19–32)
Calcium: 10 mg/dL (ref 8.4–10.5)
Chloride: 100 mEq/L (ref 96–112)
Creatinine, Ser: 0.92 mg/dL (ref 0.40–1.50)
GFR: 95.95 mL/min (ref >60.00)
Glucose, Bld: 196 mg/dL — ABNORMAL HIGH (ref 70–99)
Potassium: 4.3 mEq/L (ref 3.5–5.1)
Sodium: 138 mEq/L (ref 135–145)
Total Bilirubin: 2.1 mg/dL — ABNORMAL HIGH (ref 0.2–1.2)
Total Protein: 7.8 g/dL (ref 6.0–8.3)

## 2021-12-26 LAB — MICROALBUMIN / CREATININE URINE RATIO
Creatinine,U: 129.6 mg/dL
Microalb Creat Ratio: 2.5 mg/g (ref 0.0–30.0)
Microalb, Ur: 3.2 mg/dL — ABNORMAL HIGH (ref 0.0–1.9)

## 2021-12-26 LAB — POCT GLYCOSYLATED HEMOGLOBIN (HGB A1C): Hemoglobin A1C: 7.7 % — AB (ref 4.0–5.6)

## 2021-12-26 LAB — TSH: TSH: 2.46 u[IU]/mL (ref 0.35–5.50)

## 2021-12-26 LAB — LIPID PANEL
Cholesterol: 109 mg/dL (ref 0–200)
HDL: 39.1 mg/dL (ref >39.00)
LDL Cholesterol: 43 mg/dL (ref 0–99)
NonHDL: 69.62
Total CHOL/HDL Ratio: 3
Triglycerides: 134 mg/dL (ref 0.0–149.0)
VLDL: 26.8 mg/dL (ref 0.0–40.0)

## 2021-12-26 LAB — PSA: PSA: 2.82 ng/mL (ref 0.10–4.00)

## 2021-12-26 MED ORDER — GLIPIZIDE ER 2.5 MG PO TB24: 2.5000 mg | ORAL_TABLET | Freq: Every day | ORAL | 1 refills | Status: DC

## 2021-12-26 NOTE — Patient Instructions (Signed)
Start glipizide.  You must take this medication with a meal as discussed  Goal of fasting blood sugar is between 70-120. If in this range, we are reaching our target a1c ( goal 6.5%)   Please check fasting blood sugar in the morning time once or twice a week.  You may also check if you feel like you are having a low episode or particularly high episode of blood sugar.  If blood sugars increase, I may advise you to check blood sugar after your largest meal.  You specifically do this TWO hours after largest meal with the goal of being less than 180.  If blood sugar is checked sooner than 2 hours after largest meal, and it will be  expected to be elevated. You must wait 2 hours.   If your blood sugar is less than 180 hours after your largest meal, again we are reaching our target a1c goal   Call [facility] if: BG < 70 or > 300.   If you have any symptoms of low blood sugar ( sweating, shakiness, lightheaded, dizzy) that you notify me. If you have a low, please drink a glass of orange juice and recheck blood sugar every 5 minutes until you don't feel symptomatic AND blood sugar is above 80.

## 2021-12-26 NOTE — Assessment & Plan Note (Signed)
Anticipate controlled.  Pending lipid panel today.  Continue Crestor 10 mg

## 2021-12-26 NOTE — Progress Notes (Signed)
Subjective:    Patient ID: Andrew Loma., male    DOB: [birth year], 51 y.o.   MRN: [id number]  CC: Andrew Malinowski Atiyeh Brooke Bonito. is a 51 y.o. male who presents today for follow up.   HPI: Feels well today No complaints   DM-compliant with metformin 1000 mg twice daily, Ozempic 0.36m  mg weekly ( sample provided) Insurance doesn't cover ozempic, jardiance.  FBG 110-120. No numbness in his feet. He is following low carb diet.  HLD- compliant with crestor 187m   No trouble urinating ,hesitancy.    HISTORY:  Past Medical History:  Diagnosis Date   Chicken pox    Diabetes mellitus, type 2 ([facility]   Kidney stones    Motion sickness    back seat of car   Umbilical hernia 10/11/95/9480 Wears contact lenses    Past Surgical History:  Procedure Laterality Date   ANKLE SURGERY     x2   COLONOSCOPY WITH PROPOFOL N/A 05/03/2020   Procedure: COLONOSCOPY WITH PROPOFOL;  Surgeon: WoLucilla LameMD;  [facility] Service: Endoscopy;  Laterality: N/A;  Diabetic - oral meds priority 4   EPIGASTRIC HERNIA REPAIR N/A 03/30/2015   Procedure: HERNIA REPAIR EPIGASTRIC ADULT;  Surgeon: JeRobert BellowMD;  Location: [facility];  Service: General;  Laterality: N/A;   HERNIA REPAIR  [id number] umbilical hernia   HERNIA REPAIR  03/30/2015   Epigastric and umbilical hernia with retrorectus 6.4 cm Ventralex mesh   left and right ankle repair ligament     nasal spectrum     rotator cuff right     SHOULDER SURGERY     rotator cuff   Family History  Problem Relation Age of Onset   Hypertension Mother    Hyperlipidemia Father    Leukemia Father    Diabetes Maternal Grandmother    Heart disease Maternal Grandfather    Hyperlipidemia Maternal Grandfather    Heart disease Paternal Grandfather    Hyperlipidemia Paternal Grandfather    Thyroid cancer Neg Hx     Allergies: Patient has no known allergies. Current Outpatient Medications on File Prior to Visit  Medication Sig Dispense  Refill   APPLE CIDER VINEGAR PO Take 1 tablet by mouth daily.     blood glucose meter kit and supplies Dispense based on patient and insurance preference. Use up to four times daily as directed. (FOR ICD-10 E10.9, E11.9). 1 each 0   glucose blood (ACCU-CHEK GUIDE) test strip Use to check blood sugar twice daily 200 each 3   metFORMIN (GLUCOPHAGE) 1000 MG tablet Take 1 tablet (1,000 mg total) by mouth 2 (two) times daily with a meal. 180 tablet 3   rosuvastatin (CRESTOR) 10 MG tablet TAKE 1 TABLET(10 MG) BY MOUTH DAILY 90 tablet 3   No current facility-administered medications on file prior to visit.    Social History   Tobacco Use   Smoking status: Never   Smokeless tobacco: Never  Vaping Use   Vaping Use: Never used  Substance Use Topics   Alcohol use: Yes    Alcohol/week: 0.0 standard drinks    Comment: 2x month   Drug use: No    Review of Systems  Constitutional:  Negative for chills and fever.  Respiratory:  Negative for cough.   Cardiovascular:  Negative for chest pain and palpitations.  Gastrointestinal:  Negative for nausea and vomiting.     Objective:    BP 118/84 (BP Location: Left  Arm, Patient Position: Sitting, Cuff Size: Normal)   Pulse 81   Temp 98.1 F (36.7 C) (Oral)   Ht _0  (1.651 m)   Wt 199 lb (90.3 kg)   SpO2 98%   BMI 33.12 kg/m  BP Readings from Last 3 Encounters:  12/26/21 118/84  11/08/21 127/78  09/26/21 128/78   Wt Readings from Last 3 Encounters:  12/26/21 199 lb (90.3 kg)  11/08/21 193 lb (87.5 kg)  10/11/21 195 lb (88.5 kg)    Physical Exam Vitals reviewed.  Constitutional:      Appearance: He is well-developed.  Cardiovascular:     Rate and Rhythm: Regular rhythm.     Heart sounds: Normal heart sounds.  Pulmonary:     Effort: Pulmonary effort is normal. No respiratory distress.     Breath sounds: Normal breath sounds. No wheezing, rhonchi or rales.  Skin:    General: Skin is warm and dry.  Neurological:     Mental  Status: He is alert.  Psychiatric:        Speech: Speech normal.        Behavior: Behavior normal.       Assessment & Plan:   Problem List Items Addressed This Visit       Endocrine   Diabetes mellitus without complication ([place]) - Primary    A1c improved.  Unfortunately insurance would not cover Ozempic.  He will continue metformin 1000 mg twice daily.  We will start glipizide 2.5 mg XL with meal.  He will monitor blood sugar and if fasting blood sugars were to increase, will increase glipizide to 5 mg        Relevant Medications   glipiZIDE (GLUCOTROL XL) 2.5 MG 24 hr tablet   Other Relevant Orders   Hemoglobin A1c   TSH   CBC with Differential/Platelet   Comprehensive metabolic panel   Urine Microalbumin w/creat. ratio     Other   HLD (hyperlipidemia)    Anticipate controlled.  Pending lipid panel today.  Continue Crestor 10 mg       Relevant Orders   TSH   Lipid panel   Comprehensive metabolic panel   Other Visit Diagnoses     Screening for prostate cancer       Relevant Orders   PSA   Need for shingles vaccine       Relevant Orders   Varicella-zoster vaccine IM (Shingrix)        I have discontinued Andrew Bushy. Andrew Jr. "SONNY"'s Ozempic (0.25 or 0.5 MG/DOSE). I am also having him start on glipiZIDE. Additionally, I am having him maintain his blood glucose meter kit and supplies, rosuvastatin, APPLE CIDER VINEGAR PO, metFORMIN, and Accu-Chek Guide.   Meds ordered this encounter  Medications   glipiZIDE (GLUCOTROL XL) 2.5 MG 24 hr tablet    Sig: Take 1 tablet (2.5 mg total) by mouth daily. With meal.    Dispense:  90 tablet    Refill:  1    Order Specific Question:   Supervising Provider    Answer:   Andrew Holloway [[id number]]    Return precautions given.   Risks, benefits, and alternatives of the medications and treatment plan prescribed today were discussed, and patient expressed understanding.   Education regarding symptom management and  diagnosis given to patient on AVS.  Continue to follow with Andrew Hawthorne, FNP for routine health maintenance.   Andrew Bushy Grassel Jr. and I agreed with plan.   [name], FNP

## 2021-12-26 NOTE — Assessment & Plan Note (Signed)
A1c improved.  Unfortunately insurance would not cover Ozempic.  He will continue metformin 1000 mg twice daily.  We will start glipizide 2.5 mg XL with meal.  He will monitor blood sugar and if fasting blood sugars were to increase, will increase glipizide to 5 mg

## 2021-12-26 NOTE — Addendum Note (Signed)
Addended by: Marijo Sanes on: 12/26/2021 08:49 AM   Modules accepted: Orders

## 2021-12-27 ENCOUNTER — Other Ambulatory Visit: Payer: Self-pay | Admitting: Family

## 2021-12-27 DIAGNOSIS — R899 Unspecified abnormal finding in specimens from other organs, systems and tissues: Secondary | ICD-10-CM

## 2022-03-05 ENCOUNTER — Other Ambulatory Visit: Payer: Self-pay | Admitting: Family

## 2022-03-05 DIAGNOSIS — E785 Hyperlipidemia, unspecified: Secondary | ICD-10-CM

## 2022-03-08 ENCOUNTER — Ambulatory Visit (INDEPENDENT_AMBULATORY_CARE_PROVIDER_SITE_OTHER): Payer: BC Managed Care – PPO | Admitting: Family

## 2022-03-08 ENCOUNTER — Ambulatory Visit: Payer: Self-pay

## 2022-03-08 ENCOUNTER — Encounter: Payer: Self-pay | Admitting: Family

## 2022-03-08 DIAGNOSIS — M25371 Other instability, right ankle: Secondary | ICD-10-CM

## 2022-03-08 DIAGNOSIS — M25571 Pain in right ankle and joints of right foot: Secondary | ICD-10-CM | POA: Diagnosis not present

## 2022-03-08 NOTE — Progress Notes (Signed)
Office Visit Note   Patient: Andrew Holloway.           Date of Birth: [birth year]           MRN: [id number] Visit Date: 03/08/2022              Requested by: Allegra Grana, FNP [address] [place],  [place] [address] PCP: Allegra Grana, FNP  Chief Complaint  Patient presents with   Right Ankle - Injury      HPI: Patient is a 51 year old gentleman who presents today for initial evaluation of right ankle.  He states that he has been having difficulty with the ankle for many years he most recently had a twisting injury few days ago and has had difficulty bearing weight since he typically wears lace up ankle boots for support due to chronic instability his most recent significant injury was about a year and a half ago he states that he stepped [place]inkler [place] and had a significant inversion injury of his ankle he has medial and lateral ankle pain feels that whenever he steps on uneven terrain for the last 1-1/2 years his ankle gives out gives way instability of the ankle fears falling fears walking on uneven terrain  He must walk on uneven terrain for his work duties and is unable to do so despite lace up leather ankle work boots  Today is wearing a lace up ASO  Reports did have reconstruction of his ankle ligaments over 30 years ago.  Initially did quite well until the injury about a year and a half ago   He is currently using ibuprofen for pain alternating ice and heat  Assessment & Plan: Visit Diagnoses:  1. Pain in right ankle and joints of right foot     Plan: We will plan for MRI evaluate instability and ankle ligaments.  He will follow-up with Dr. Lajoyce Corners  Follow-Up Instructions: No follow-ups on file.   Right Ankle Exam   Tenderness  The patient is experiencing tenderness in the ATF and deltoid. Swelling: mild  Tests  Anterior drawer: 1+ (Significant pain.  Poorly tolerated)  Other  Erythema: absent Pulse: present    Comments:  Pain with lateral compression of syndesmosis      Patient is alert, oriented, no adenopathy, well-dressed, normal affect, normal respiratory effort.   Imaging: No results found. No images are attached to the encounter.  Labs: Lab Results  Component Value Date   HGBA1C 7.7 (A) 12/26/2021   HGBA1C 9.9 (A) 09/26/2021   HGBA1C 8.9 (A) 01/24/2021     Lab Results  Component Value Date   ALBUMIN 4.7 12/26/2021   ALBUMIN 4.5 06/07/2020   ALBUMIN 4.5 04/16/2020    No results found for: "MG" No results found for: "VD25OH"  No results found for: "PREALBUMIN"    Latest Ref Rng & Units 12/26/2021    8:37 AM 06/16/2019    4:28 PM 11/04/2014   11:43 AM  CBC EXTENDED  WBC 4.0 - 10.5 K/uL 5.7  6.6  14.4   RBC 4.22 - 5.81 Mil/uL 5.57  5.51  5.58   Hemoglobin 13.0 - 17.0 g/dL 81.0  17.5  10.2   HCT 39.0 - 52.0 % 49.4  49.6  50.7   Platelets 150.0 - 400.0 K/uL 266.0  249.0  290   NEUT# 1.4 - 7.7 K/uL 3.0     Lymph# 0.7 - 4.0 K/uL 2.2  There is no height or weight on file to calculate BMI.  Orders:  Orders Placed This Encounter  Procedures   XR Ankle Complete Right   No orders of the defined types were placed in this encounter.    Procedures: No procedures performed  Clinical Data: No additional findings.  ROS:  All other systems negative, except as noted in the HPI. Review of Systems  Constitutional:  Negative for chills and fever.  Musculoskeletal:  Positive for arthralgias, gait problem and myalgias. Negative for joint swelling.  Skin:  Negative for color change.  Neurological:  Negative for weakness and numbness.    Objective: Vital Signs: There were no vitals taken for this visit.  Specialty Comments:  No specialty comments available.  PMFS History: Patient Active Problem List   Diagnosis Date Noted   Ganglion cyst of dorsum of left wrist 01/24/2021   Encounter for screening colonoscopy    Skin lesion 03/05/2020   Low back pain  10/08/2019   HLD (hyperlipidemia) 08/06/2019   Diabetes mellitus without complication (HCC) 08/06/2019   Abscess of postoperative wound of abdominal wall 05/03/2015   Recurrent umbilical hernia 04/19/2015   Epigastric hernia 03/17/2015   Obesity, unspecified 02/13/2013   Skin lesion of scalp 02/13/2013   Umbilical hernia 10/17/2011   Past Medical History:  Diagnosis Date   Chicken pox    Diabetes mellitus, type 2 (HCC)    Kidney stones    Motion sickness    back seat of car   Umbilical hernia 10/17/2011   Wears contact lenses     Family History  Problem Relation Age of Onset   Hypertension Mother    Hyperlipidemia Father    Leukemia Father    Diabetes Maternal Grandmother    Heart disease Maternal Grandfather    Hyperlipidemia Maternal Grandfather    Heart disease Paternal Grandfather    Hyperlipidemia Paternal Grandfather    Thyroid cancer Neg Hx     Past Surgical History:  Procedure Laterality Date   ANKLE SURGERY     x2   COLONOSCOPY WITH PROPOFOL N/A 05/03/2020   Procedure: COLONOSCOPY WITH PROPOFOL;  Surgeon: Midge Minium, MD;  Location: [facility];  Service: Endoscopy;  Laterality: N/A;  Diabetic - oral meds priority 4   EPIGASTRIC HERNIA REPAIR N/A 03/30/2015   Procedure: HERNIA REPAIR EPIGASTRIC ADULT;  Surgeon: Earline Mayotte, MD;  Location: [facility];  Service: General;  Laterality: N/A;   HERNIA REPAIR  2014   umbilical hernia   HERNIA REPAIR  03/30/2015   Epigastric and umbilical hernia with retrorectus 6.4 cm Ventralex mesh   left and right ankle repair ligament     nasal spectrum     rotator cuff right     SHOULDER SURGERY     rotator cuff   Social History   Occupational History   Not on file  Tobacco Use   Smoking status: Never   Smokeless tobacco: Never  Vaping Use   Vaping Use: Never used  Substance and Sexual Activity   Alcohol use: Yes    Alcohol/week: 0.0 standard drinks of alcohol    Comment: 2x month   Drug use: No    Sexual activity: Yes

## 2022-03-19 ENCOUNTER — Ambulatory Visit
Admission: RE | Admit: 2022-03-19 | Discharge: 2022-03-19 | Disposition: A | Discharge status: Home / Self Care | Payer: BC Managed Care – PPO | Source: Ambulatory Visit | Attending: Family | Admitting: Family

## 2022-03-19 DIAGNOSIS — M25371 Other instability, right ankle: Secondary | ICD-10-CM

## 2022-03-19 DIAGNOSIS — M25571 Pain in right ankle and joints of right foot: Secondary | ICD-10-CM | POA: Diagnosis not present

## 2022-03-19 DIAGNOSIS — R600 Localized edema: Secondary | ICD-10-CM | POA: Diagnosis not present

## 2022-03-22 ENCOUNTER — Ambulatory Visit: Payer: BC Managed Care – PPO | Admitting: Orthopaedic Surgery

## 2022-03-28 ENCOUNTER — Encounter: Payer: Self-pay | Admitting: Family

## 2022-03-28 ENCOUNTER — Ambulatory Visit (INDEPENDENT_AMBULATORY_CARE_PROVIDER_SITE_OTHER): Payer: BC Managed Care – PPO | Admitting: Family

## 2022-03-28 VITALS — BP 118/84 | HR 94 | Temp 98.0°F | Ht 68.0 in | Wt 206.0 lb

## 2022-03-28 DIAGNOSIS — E785 Hyperlipidemia, unspecified: Secondary | ICD-10-CM | POA: Diagnosis not present

## 2022-03-28 DIAGNOSIS — M545 Low back pain, unspecified: Secondary | ICD-10-CM

## 2022-03-28 DIAGNOSIS — R972 Elevated prostate specific antigen [PSA]: Secondary | ICD-10-CM | POA: Diagnosis not present

## 2022-03-28 DIAGNOSIS — E119 Type 2 diabetes mellitus without complications: Secondary | ICD-10-CM

## 2022-03-28 LAB — POCT GLYCOSYLATED HEMOGLOBIN (HGB A1C): Hemoglobin A1C: 10.6 % — AB (ref 4.0–5.6)

## 2022-03-28 MED ORDER — GLIPIZIDE ER 10 MG PO TB24: 10.0000 mg | ORAL_TABLET | Freq: Every day | ORAL | 1 refills | Status: DC

## 2022-03-28 NOTE — Assessment & Plan Note (Signed)
Lab Results Component Value Date  LDLCALC 43 12/26/2021  Excellent control.  Continue Crestor 10 mg

## 2022-03-28 NOTE — Patient Instructions (Addendum)
Let me know if you would me to send mobic ( anti inflammatory) or order xray of low back.   Let me know with new insurance see if either Jardiance or ozempic is covered.   Increase glipizide to 5mg  for the next couple of days and then start glipizide 10mg .   Goal of fasting blood sugar is between 70-120. If in this range, we are reaching our target a1c ( goal 6.5%)   Please check fasting blood sugar in the morning time once or twice a week.  You may also check if you feel like you are having a low episode or particularly high episode of blood sugar.  If blood sugars increase, I may advise you to check blood sugar after your largest meal.  You specifically do this TWO hours after largest meal with the goal of being less than 180.  If blood sugar is checked sooner than 2 hours after largest meal, and it will be  expected to be elevated. You must wait 2 hours.   If your blood sugar is less than 180 hours after your largest meal, again we are reaching our target a1c goal   Call [facility] clinic if: BG < 70 or > 300.   If you have any symptoms of low blood sugar ( sweating, shakiness, lightheaded, dizzy) that you notify me. If you have a low, please drink a glass of orange juice and recheck blood sugar every 5 minutes until you don't feel symptomatic AND blood sugar is above 80.

## 2022-03-28 NOTE — Progress Notes (Signed)
Discussed during OV. Please see OV notes

## 2022-03-28 NOTE — Assessment & Plan Note (Addendum)
Uncontrolled.  Patient's been unable to exercise as he typically has in the past due to ankle instability.  We discussed increasing glipizide from 2.5 mg to 10 mg with close vigilance as it relates to hypoglycemia.  He understands to take this medication with a meal.  Unfortunately his insurance is denied coverage for GLP-1, SGLT2.  He will obtain new insurance January 2024 and will likely retrial 1 of these agents.  May consider insulin if needed.  He will continue metformin 1000 mg twice daily and call me with blood sugars as we increase glipizide.  We also discussed proteinuria.  Patient politely declines starting losartan for renal protection at this time.  Suspect proteinuria is also related to poor diabetic control.  We will work on diabetic control and later recheck

## 2022-03-28 NOTE — Assessment & Plan Note (Addendum)
Suspect degree of degenerative disc disease.  No radicular symptoms at this time.  Patient declines imaging or trial of meloxicam.  He will let me know how he is doing

## 2022-03-28 NOTE — Progress Notes (Signed)
Subjective:    Patient ID: Andrew Holloway., male    DOB: 1971-04-21, 51 y.o.   MRN: [id number]  CC: [name] is a 51 y.o. male who presents today for follow up.   HPI: He complains ache lumbar spine for past couple of weeks, onset when he started walking 3-5 miles per day.   He has been taking 1-2 tablets of advl with relief.  Painful sitting and raises legs straight in front of him.  Pain improves with walking.   No numbness, weakness, saddle anesthesia.  He hasnt been exercising due to ankle stability. He is following with orthopedics.   No h/o GIB.     No trouble urinating, urinary hesitancy.   Proteinuria-he didn't start losartan 12.5 mg   DM- compliant with metformin 1025m BID, glipizide 2.591m HLD- compliant with crestor 1069m  HISTORY:  Past Medical History:  Diagnosis Date   Chicken pox    Diabetes mellitus, type 2 ([facility]  Kidney stones    Motion sickness    back seat of car   Umbilical hernia 10/19/07/8119Wears contact lenses    Past Surgical History:  Procedure Laterality Date   ANKLE SURGERY     x2   COLONOSCOPY WITH PROPOFOL N/A 05/03/2020   Procedure: COLONOSCOPY WITH PROPOFOL;  Surgeon: WohLucilla LameD;  [facility];  Laterality: N/A;  Diabetic - oral meds priority 4   EPIGASTRIC HERNIA REPAIR N/A 03/30/2015   Procedure: HERNIA REPAIR EPIGASTRIC ADULT;  Surgeon: JefRobert BellowD;  Location: [facility];  Service: General;  Laterality: N/A;   HERNIA REPAIR  [id number] hernia   HERNIA REPAIR  03/30/2015   Epigastric and umbilical hernia with retrorectus 6.4 cm Ventralex mesh   left and right ankle repair ligament     nasal spectrum     rotator cuff right     SHOULDER SURGERY     rotator cuff   Family History  Problem Relation Age of Onset   Hypertension Mother    Hyperlipidemia Father    Leukemia Father    Diabetes Maternal Grandmother    Heart disease Maternal Grandfather     Hyperlipidemia Maternal Grandfather    Heart disease Paternal Grandfather    Hyperlipidemia Paternal Grandfather    Thyroid cancer Neg Hx     Allergies: Patient has no known allergies. Current Outpatient Medications on File Prior to Visit  Medication Sig Dispense Refill   APPLE CIDER VINEGAR PO Take 1 tablet by mouth daily.     blood glucose meter kit and supplies Dispense based on patient and insurance preference. Use up to four times daily as directed. (FOR ICD-10 E10.9, E11.9). 1 each 0   glucose blood (ACCU-CHEK GUIDE) test strip Use to check blood sugar twice daily 200 each 3   metFORMIN (GLUCOPHAGE) 1000 MG tablet Take 1 tablet (1,000 mg total) by mouth 2 (two) times daily with a meal. 180 tablet 3   rosuvastatin (CRESTOR) 10 MG tablet TAKE 1 TABLET(10 MG) BY MOUTH DAILY 90 tablet 3   No current facility-administered medications on file prior to visit.    Social History   Tobacco Use   Smoking status: Never   Smokeless tobacco: Never  Vaping Use   Vaping Use: Never used  Substance Use Topics   Alcohol use: Yes    Alcohol/week: 0.0 standard drinks of alcohol    Comment: 2x month  Drug use: No    Review of Systems  Constitutional:  Negative for chills and fever.  Respiratory:  Negative for cough.   Cardiovascular:  Negative for chest pain and palpitations.  Gastrointestinal:  Negative for nausea and vomiting.  Musculoskeletal:  Positive for neck pain.  Neurological:  Negative for weakness and numbness.      Objective:    BP 118/84 (BP Location: Left Arm, Patient Position: Sitting, Cuff Size: Normal)   Pulse 94   Temp 98 F (36.7 C) (Oral)   Ht _0  (1.727 m)   Wt 206 lb (93.4 kg)   SpO2 96%   BMI 31.32 kg/m  BP Readings from Last 3 Encounters:  03/28/22 118/84  12/26/21 118/84  11/08/21 127/78   Wt Readings from Last 3 Encounters:  03/28/22 206 lb (93.4 kg)  12/26/21 199 lb (90.3 kg)  11/08/21 193 lb (87.5 kg)    Physical Exam Vitals reviewed.   Constitutional:      Appearance: He is well-developed.  Cardiovascular:     Rate and Rhythm: Regular rhythm.     Heart sounds: Normal heart sounds.  Pulmonary:     Effort: Pulmonary effort is normal. No respiratory distress.     Breath sounds: Normal breath sounds. No wheezing, rhonchi or rales.  Musculoskeletal:     Lumbar back: Tenderness present. Normal range of motion.       Back:  Skin:    General: Skin is warm and dry.  Neurological:     Mental Status: He is alert.  Psychiatric:        Speech: Speech normal.        Behavior: Behavior normal.        Assessment & Plan:   Problem List Items Addressed This Visit       Endocrine   Diabetes mellitus without complication ([place]) - Primary    Uncontrolled.  Patient's been unable to exercise as he typically has in the past due to ankle instability.  We discussed increasing glipizide from 2.5 mg to 10 mg with close vigilance as it relates to hypoglycemia.  He understands to take this medication with a meal.  Unfortunately his insurance is denied coverage for GLP-1, SGLT2.  He will obtain new insurance January 2024 and will likely retrial 1 of these agents.  May consider insulin if needed.  He will continue metformin 1000 mg twice daily and call me with blood sugars as we increase glipizide.  We also discussed proteinuria.  Patient politely declines starting losartan for renal protection at this time.  Suspect proteinuria is also related to poor diabetic control.  We will work on diabetic control and later recheck      Relevant Medications   glipiZIDE (GLUCOTROL XL) 10 MG 24 hr tablet   Other Relevant Orders   POCT HgB A1C (Completed)     Other   HLD (hyperlipidemia)    Lab Results Component Value Date  LDLCALC 43 12/26/2021  Excellent control.  Continue Crestor 10 mg      Low back pain    Suspect degree of degenerative disc disease.  No radicular symptoms at this time.  Patient declines imaging or trial of meloxicam.  He  will let me know how he is doing      Other Visit Diagnoses     Increased prostate specific antigen (PSA) velocity       Relevant Orders   PSA        I have discontinued Grace Bushy. [name]'s glipiZIDE. I am also having him start on glipiZIDE. Additionally, I am having him maintain his blood glucose meter kit and supplies, APPLE CIDER VINEGAR PO, metFORMIN, Accu-Chek Guide, and rosuvastatin.   Meds ordered this encounter  Medications   glipiZIDE (GLUCOTROL XL) 10 MG 24 hr tablet    Sig: Take 1 tablet (10 mg total) by mouth daily with breakfast.    Dispense:  90 tablet    Refill:  1    Order Specific Question:   Supervising Provider    Answer:   Crecencio Mc [[id number]]    Return precautions given.   Risks, benefits, and alternatives of the medications and treatment plan prescribed today were discussed, and patient expressed understanding.   Education regarding symptom management and diagnosis given to patient on AVS.  Continue to follow with Burnard Hawthorne, FNP for routine health maintenance.   Grace Bushy Proffit Jr. and I agreed with plan.   [name], FNP

## 2022-04-04 ENCOUNTER — Ambulatory Visit (INDEPENDENT_AMBULATORY_CARE_PROVIDER_SITE_OTHER): Payer: BC Managed Care – PPO | Admitting: Orthopedic Surgery

## 2022-04-04 DIAGNOSIS — S93491S Sprain of other ligament of right ankle, sequela: Secondary | ICD-10-CM | POA: Diagnosis not present

## 2022-04-04 DIAGNOSIS — M25371 Other instability, right ankle: Secondary | ICD-10-CM | POA: Diagnosis not present

## 2022-04-04 DIAGNOSIS — M25571 Pain in right ankle and joints of right foot: Secondary | ICD-10-CM | POA: Diagnosis not present

## 2022-04-19 ENCOUNTER — Encounter: Payer: Self-pay | Admitting: Orthopedic Surgery

## 2022-04-19 NOTE — Progress Notes (Signed)
Office Visit Note   Patient: Andrew Holloway.           Date of Birth: April 28, 1971           MRN: [id number] Visit Date: 04/04/2022              Requested by: Allegra Grana, FNP [address] [place],  [place] [address] PCP: Allegra Grana, FNP  Chief Complaint  Patient presents with   Right Ankle - Follow-up    MRI review      HPI: Patient is a 51 year old gentleman who is seen for evaluation for instability right ankle.  Patient states that he had ankle reconstruction on the left in 1992.  Patient complains of instability and pain medially and laterally of the right ankle.  Assessment & Plan: Visit Diagnoses: No diagnosis found.  Plan: With patient's persistent instability pain with activities of daily living patient states she would like to proceed with anterior talofibular ligament reconstruction and possible ankle arthroscopy.  Patient would like to proceed with surgery this year.  Follow-Up Instructions: Return in about 2 weeks (around 04/18/2022).   Ortho Exam  Patient is alert, oriented, no adenopathy, well-dressed, normal affect, normal respiratory effort. Examination review of the MRI scan shows some edema in the medial and lateral ankle tendons.  Patient has pain to palpation directly over the anterior talofibular ligament anterior drawer has a positive clunk.  Patient has no subluxation of the peroneal tendons there is minimal pain to palpation of the peroneals or posterior tibial tendon.  Inversion stress of the ankle reproduces the anterior talofibular ligament pain.  Imaging: No results found. No images are attached to the encounter.  Labs: Lab Results  Component Value Date   HGBA1C 10.6 (A) 03/28/2022   HGBA1C 7.7 (A) 12/26/2021   HGBA1C 9.9 (A) 09/26/2021     Lab Results  Component Value Date   ALBUMIN 4.7 12/26/2021   ALBUMIN 4.5 06/07/2020   ALBUMIN 4.5 04/16/2020    No results found for: "MG" No results found for:  "VD25OH"  No results found for: "PREALBUMIN"    Latest Ref Rng & Units 12/26/2021    8:37 AM 06/16/2019    4:28 PM 11/04/2014   11:43 AM  CBC EXTENDED  WBC 4.0 - 10.5 K/uL 5.7  6.6  14.4   RBC 4.22 - 5.81 Mil/uL 5.57  5.51  5.58   Hemoglobin 13.0 - 17.0 g/dL 72.5  36.6  44.0   HCT 39.0 - 52.0 % 49.4  49.6  50.7   Platelets 150.0 - 400.0 K/uL 266.0  249.0  290   NEUT# 1.4 - 7.7 K/uL 3.0     Lymph# 0.7 - 4.0 K/uL 2.2        There is no height or weight on file to calculate BMI.  Orders:  No orders of the defined types were placed in this encounter.  No orders of the defined types were placed in this encounter.    Procedures: No procedures performed  Clinical Data: No additional findings.  ROS:  All other systems negative, except as noted in the HPI. Review of Systems  Objective: Vital Signs: There were no vitals taken for this visit.  Specialty Comments:  No specialty comments available.  PMFS History: Patient Active Problem List   Diagnosis Date Noted   Ganglion cyst of dorsum of left wrist 01/24/2021   Encounter for screening colonoscopy    Skin lesion 03/05/2020   Low back pain  10/08/2019   HLD (hyperlipidemia) 08/06/2019   Diabetes mellitus without complication (HCC) 08/06/2019   Abscess of postoperative wound of abdominal wall 05/03/2015   Recurrent umbilical hernia 04/19/2015   Epigastric hernia 03/17/2015   Obesity, unspecified 02/13/2013   Skin lesion of scalp 02/13/2013   Umbilical hernia 10/17/2011   Past Medical History:  Diagnosis Date   Chicken pox    Diabetes mellitus, type 2 (HCC)    Kidney stones    Motion sickness    back seat of car   Umbilical hernia 10/17/2011   Wears contact lenses     Family History  Problem Relation Age of Onset   Hypertension Mother    Hyperlipidemia Father    Leukemia Father    Diabetes Maternal Grandmother    Heart disease Maternal Grandfather    Hyperlipidemia Maternal Grandfather    Heart disease  Paternal Grandfather    Hyperlipidemia Paternal Grandfather    Thyroid cancer Neg Hx     Past Surgical History:  Procedure Laterality Date   ANKLE SURGERY     x2   COLONOSCOPY WITH PROPOFOL N/A 05/03/2020   Procedure: COLONOSCOPY WITH PROPOFOL;  Surgeon: Midge Minium, MD;  Location: [facility];  Service: Endoscopy;  Laterality: N/A;  Diabetic - oral meds priority 4   EPIGASTRIC HERNIA REPAIR N/A 03/30/2015   Procedure: HERNIA REPAIR EPIGASTRIC ADULT;  Surgeon: Earline Mayotte, MD;  Location: [facility];  Service: General;  Laterality: N/A;   HERNIA REPAIR  2014   umbilical hernia   HERNIA REPAIR  03/30/2015   Epigastric and umbilical hernia with retrorectus 6.4 cm Ventralex mesh   left and right ankle repair ligament     nasal spectrum     rotator cuff right     SHOULDER SURGERY     rotator cuff   Social History   Occupational History   Not on file  Tobacco Use   Smoking status: Never   Smokeless tobacco: Never  Vaping Use   Vaping Use: Never used  Substance and Sexual Activity   Alcohol use: Yes    Alcohol/week: 0.0 standard drinks of alcohol    Comment: 2x month   Drug use: No   Sexual activity: Yes

## 2022-04-20 ENCOUNTER — Telehealth: Payer: Self-pay | Admitting: Orthopedic Surgery

## 2022-04-20 NOTE — Telephone Encounter (Signed)
Patient is needing to schedule surgery with Dr. Lajoyce Corners. CB # [phone]

## 2022-05-23 ENCOUNTER — Encounter: Payer: Self-pay | Admitting: Orthopedic Surgery

## 2022-05-23 ENCOUNTER — Ambulatory Visit (INDEPENDENT_AMBULATORY_CARE_PROVIDER_SITE_OTHER): Payer: BC Managed Care – PPO

## 2022-05-23 ENCOUNTER — Ambulatory Visit (INDEPENDENT_AMBULATORY_CARE_PROVIDER_SITE_OTHER): Payer: BC Managed Care – PPO | Admitting: Orthopedic Surgery

## 2022-05-23 VITALS — BP 130/86 | HR 88 | Ht 68.0 in | Wt 206.0 lb

## 2022-05-23 DIAGNOSIS — M545 Low back pain, unspecified: Secondary | ICD-10-CM

## 2022-05-23 NOTE — Progress Notes (Signed)
Orthopedic Spine Surgery Office Note  Assessment: Patient is a 51 y.o. male with acute onset low back pain   Plan: -Explained that initially conservative treatment is tried as a significant number of patients may experience relief with these treatment modalities. Discussed that the conservative treatments include:  -activity modification  -physical therapy  -over the counter pain medications -Patient has tried activity modification, pain medication previously prescribed for kidney stones  -Recommended NSAIDs, physical therapy, and home exercises. Referral provided for physical therapy -Patient should return to office in 2 weeks, repeat x-rays of lumbar spine at next visit: none   Patient expressed understanding of the plan and all questions were answered to the patient's satisfaction.   ___________________________________________________________________________   History:  Patient is a 51 y.o. male who presents today for lumbar spine.  Patient has had acute onset of low back pain with the last 2 months.  He states that about a month ago he had lower back pain that came on atraumatically.  This resolved after couple days time.  Then, this past Saturday (05/20/2022) the pain returned.  It was more significant.  He feels it in his lower back.  It does not radiate down the legs.  He has no numbness or tingling.  He has not had back pain like this prior to that first episode a month ago.  There is no trauma or injury prior to the flare up of back pain on Saturday.  States he woke up with the pain.  Pain is worse with flexion and improves with extension.  It also improves with rest.   Weakness: Denies Symptoms of imbalance: Denies Paresthesias and numbness: Denies Bowel or bladder incontinence: Denies Saddle anesthesia: Denies  Treatments tried: Activity modification, pain medication from kidney stones  Review of systems: Denies fevers and chills, night sweats, unexplained weight loss,  history of cancer. Has had pain that wakes him at night on rare occasion since 05/20/2022.  Past medical history: Diabetes (A1C of 10.6 on 03/28/2022) Kidney stones  Allergies: NKDA  Past surgical history:  Ankle surgery x2 Hernia repair Nasal septum Right rotator cuff repair  Social history: Denies use of nicotine product (smoking, vaping, patches, smokeless) Alcohol use: yesm about 1 drink per week Denies recreational drug use   Physical Exam:  BMI of 34  General: no acute distress, appears stated age Neurologic: alert, answering questions appropriately, following commands Respiratory: unlabored breathing on room air, symmetric chest rise Psychiatric: appropriate affect, normal cadence to speech   MSK (spine):  -Strength exam      Left  Right EHL    5/5  5/5 TA    5/5  5/5 GSC    5/5  5/5 Knee extension  5/5  5/5 Hip flexion   5/5  5/5  -Sensory exam    Sensation intact to light touch in L3-S1 nerve distributions of bilateral lower extremities  -Achilles DTR: 2/4 on the left, 2/4 on the right -Patellar tendon DTR: 2/4 on the left, 2/4 on the right  -Straight leg raise: Negative -Contralateral straight leg raise: Negative -Clonus: no beats bilaterally  -Left hip exam: No pain through range of motion, negative Stinchfield, negative Faber -Right hip exam: Positive FADIR, no pain through remainder of range of motion, negative Stinchfield, negative Faber  Imaging: XR of the lumbar spine from 05/23/2022 was independently reviewed and interpreted, showing early degenerative changes at L1/2 with small anterior osteophytes. No other significant degenerative changes. No evidence of instability. No fracture or dislocation.  Patient name: Andrew Holloway. Patient MRN: [id number] Date of visit: 05/23/22

## 2022-06-03 ENCOUNTER — Other Ambulatory Visit: Payer: Self-pay | Admitting: Family

## 2022-06-03 DIAGNOSIS — E1165 Type 2 diabetes mellitus with hyperglycemia: Secondary | ICD-10-CM

## 2022-06-11 ENCOUNTER — Encounter: Payer: Self-pay | Admitting: Orthopedic Surgery

## 2022-06-15 ENCOUNTER — Ambulatory Visit: Payer: BC Managed Care – PPO | Admitting: Orthopedic Surgery

## 2022-06-22 ENCOUNTER — Encounter (HOSPITAL_COMMUNITY): Payer: Self-pay | Admitting: Vascular Surgery

## 2022-06-22 ENCOUNTER — Other Ambulatory Visit: Payer: Self-pay

## 2022-06-22 ENCOUNTER — Encounter (HOSPITAL_COMMUNITY): Payer: Self-pay | Admitting: Orthopedic Surgery

## 2022-06-22 NOTE — Progress Notes (Signed)
Anesthesia Chart Review:SAME DAY WORK-UP  Case: [id number] Date/Time: 06/23/22 0715   Procedure: RECONSTRUCTION LATERAL RIGHT ANKLE LIGAMENT (Right: Ankle)   Anesthesia type: Choice   Pre-op diagnosis: Laxity Lateral Right Ankle Ligament   Location: [facility] OR ROOM 05 / [place] [facility]   Surgeons: Newt Minion, MD       DISCUSSION: Patient is a 51 year old male scheduled for the above procedure.  History includes never smoker, DM2, umbilical hernia (s/p repair 03/28/13 & 03/30/15), nephrolithiasis, ankle surgery (left ankle reconstruction 1992), nasal septal surgery.  A1c up to 10.6% on 03/28/22 (previously 7.7% on 12/26/21). His PCP increased his glipizide from 2.5 mg to glipizide XL 10 mg daily. Metformin 1000 mg BID continued. His insurance coverage denied GLP-1 and SGLT2, but may reconsider when insurance changes in 2024. Adding insulin will also be considered in the future if no improvement or other medications denied. He has routine 3 month PCP follow-up scheduled for 06/08/22. (A1c results were noted in Dr. Jess Barters 04/04/22 Progress Note.)  He is a same day work-up, so labs and EKG on arrival as indicated. Anesthesia team to evaluate on the day of surgery.    VS:  BP Readings from Last 3 Encounters:  05/23/22 130/86  03/28/22 118/84  12/26/21 118/84   Pulse Readings from Last 3 Encounters:  05/23/22 88  03/28/22 94  12/26/21 81     PROVIDERS: Burnard Hawthorne, FNP is PCP    LABS: He is for labs on arrival as indicated. Normal CBC on 12/26/21. Glucose 196, Cr 0.92, AST 24, ALT 38, total bili 2.1 on 12/26/21. A1c was elevated at 10.6% on  03/28/22.    IMAGES: "XR of the lumbar spine from 05/23/2022 was independently reviewed and interpreted, showing early degenerative changes at L1/2 with small anterior osteophytes. No other significant degenerative changes. No evidence of instability. No fracture or dislocation."  MRI Right ankle 03/19/22: IMPRESSION: 1. Edema along the peroneus brevis and  peroneus longus tendon suggesting tenosynovitis. 2. Thinning of the anterior talofibular ligament with edema which is likely sequela of chronic sprain/tear. 3. Plantar fascial thickening with mild adjacent edema concerning for mild plantar fasciitis.    EKG: Last EKG is > 1 year ago. On 02/12/13, EKG showed SR, RSR in V1 (nondiagnostic).    CV: N/A  Past Medical History:  Diagnosis Date   Chicken pox    Diabetes mellitus, type 2 ([place])    History of kidney stones    Motion sickness    back seat of car   Umbilical hernia 56/31/4970   Wears contact lenses     Past Surgical History:  Procedure Laterality Date   ANKLE SURGERY     x2   COLONOSCOPY WITH PROPOFOL N/A 05/03/2020   Procedure: COLONOSCOPY WITH PROPOFOL;  Surgeon: Lucilla Lame, MD;  Location: [facility];  Service: Endoscopy;  Laterality: N/A;  Diabetic - oral meds priority 4   EPIGASTRIC HERNIA REPAIR N/A 03/30/2015   Procedure: HERNIA REPAIR EPIGASTRIC ADULT;  Surgeon: Robert Bellow, MD;  Location: [facility];  Service: General;  Laterality: N/A;   HERNIA REPAIR  2637   umbilical hernia   HERNIA REPAIR  03/30/2015   Epigastric and umbilical hernia with retrorectus 6.4 cm Ventralex mesh   left and right ankle repair ligament     nasal spectrum     rotator cuff right     SHOULDER SURGERY     rotator cuff    MEDICATIONS: No current facility-administered medications for this encounter.  APPLE CIDER VINEGAR PO   Aspirin-Acetaminophen-Caffeine (GOODYS EXTRA STRENGTH) 500-325-65 MG PACK   glipiZIDE (GLUCOTROL XL) 10 MG 24 hr tablet   ibuprofen (ADVIL) 200 MG tablet   MAGNESIUM PO   metFORMIN (GLUCOPHAGE) 1000 MG tablet   Multiple Vitamins-Minerals (ZINC PO)   NON FORMULARY   Omega-3 Fatty Acids (FISH OIL PO)   OVER THE COUNTER MEDICATION   rosuvastatin (CRESTOR) 10 MG tablet   blood glucose meter kit and supplies   glucose blood (ACCU-CHEK GUIDE) test strip    Myra Gianotti, PA-C Surgical Short  Stay/Anesthesiology [facility] Phone [phone] [facility] Phone [phone] 06/22/2022 9:45 AM

## 2022-06-22 NOTE — Progress Notes (Signed)
Spoke with patient over the phone for PAT visit. Patient denies any cardiac history. Diabetic type 2. Pt aware not to take any diabetic medications morning of surgery. Pt will check BS in the morning and instructed if BS less than 70 to drink 4 oz clear juice and recheck BS in 15 mins. Pt instructed to take a shower using antibacterial soap. Pt verbalizes understanding.

## 2022-06-22 NOTE — Anesthesia Preprocedure Evaluation (Deleted)
Anesthesia Evaluation    Airway        Dental   Pulmonary           Cardiovascular      Neuro/Psych    GI/Hepatic   Endo/Other  diabetes    Renal/GU      Musculoskeletal   Abdominal   Peds  Hematology   Anesthesia Other Findings   Reproductive/Obstetrics                             Anesthesia Physical Anesthesia Plan  ASA:   Anesthesia Plan:    Post-op Pain Management:    Induction:   PONV Risk Score and Plan:   Airway Management Planned:   Additional Equipment:   Intra-op Plan:   Post-operative Plan:   Informed Consent:   Plan Discussed with:   Anesthesia Plan Comments: (PAT note written 06/22/2022 by Shonna Chock, PA-C.  )       Anesthesia Quick Evaluation

## 2022-06-23 ENCOUNTER — Encounter (HOSPITAL_COMMUNITY): Payer: Self-pay | Admitting: Orthopedic Surgery

## 2022-06-23 ENCOUNTER — Ambulatory Visit (HOSPITAL_COMMUNITY)
Admission: RE | Admit: 2022-06-23 | Discharge: 2022-06-23 | Disposition: A | Discharge status: Home / Self Care | Payer: BC Managed Care – PPO | Attending: Orthopedic Surgery | Admitting: Orthopedic Surgery

## 2022-06-23 ENCOUNTER — Other Ambulatory Visit: Payer: Self-pay

## 2022-06-23 ENCOUNTER — Encounter (HOSPITAL_COMMUNITY)
Admission: RE | Disposition: A | Discharge status: Home / Self Care | Payer: Self-pay | Source: Home / Self Care | Attending: Orthopedic Surgery

## 2022-06-23 ENCOUNTER — Telehealth: Payer: Self-pay | Admitting: Family

## 2022-06-23 DIAGNOSIS — Z7984 Long term (current) use of oral hypoglycemic drugs: Secondary | ICD-10-CM | POA: Insufficient documentation

## 2022-06-23 DIAGNOSIS — E119 Type 2 diabetes mellitus without complications: Secondary | ICD-10-CM | POA: Diagnosis not present

## 2022-06-23 DIAGNOSIS — Z538 Procedure and treatment not carried out for other reasons: Secondary | ICD-10-CM | POA: Insufficient documentation

## 2022-06-23 DIAGNOSIS — M24271 Disorder of ligament, right ankle: Secondary | ICD-10-CM | POA: Insufficient documentation

## 2022-06-23 DIAGNOSIS — Z9889 Other specified postprocedural states: Secondary | ICD-10-CM | POA: Insufficient documentation

## 2022-06-23 HISTORY — DX: Personal history of urinary calculi: Z87.442

## 2022-06-23 LAB — BASIC METABOLIC PANEL
Anion gap: 12 (ref 5–15)
BUN: 9 mg/dL (ref 6–20)
CO2: 24 mmol/L (ref 22–32)
Calcium: 9.3 mg/dL (ref 8.9–10.3)
Chloride: 102 mmol/L (ref 98–111)
Creatinine, Ser: 0.84 mg/dL (ref 0.61–1.24)
GFR, Estimated: >60 mL/min (ref >60)
Glucose, Bld: 300 mg/dL — ABNORMAL HIGH (ref 70–99)
Potassium: 3.6 mmol/L (ref 3.5–5.1)
Sodium: 138 mmol/L (ref 135–145)

## 2022-06-23 LAB — CBC
HCT: 48.4 % (ref 39.0–52.0)
Hemoglobin: 16.3 g/dL (ref 13.0–17.0)
MCH: 29.5 pg (ref 26.0–34.0)
MCHC: 33.7 g/dL (ref 30.0–36.0)
MCV: 87.7 fL (ref 80.0–100.0)
Platelets: 268 10*3/uL (ref 150–400)
RBC: 5.52 MIL/uL (ref 4.22–5.81)
RDW: 12.9 % (ref 11.5–15.5)
WBC: 5.4 10*3/uL (ref 4.0–10.5)
nRBC: 0 % (ref 0.0–0.2)

## 2022-06-23 LAB — GLUCOSE, CAPILLARY: Glucose-Capillary: 355 mg/dL — ABNORMAL HIGH (ref 70–99)

## 2022-06-23 LAB — SURGICAL PCR SCREEN
MRSA, PCR: NEGATIVE
Staphylococcus aureus: POSITIVE — AB

## 2022-06-23 SURGERY — RECONSTRUCTION, ANKLE
Anesthesia: Choice | Site: Ankle | Laterality: Right

## 2022-06-23 MED ORDER — FENTANYL CITRATE (PF) 250 MCG/5ML IJ SOLN
INTRAMUSCULAR | Status: AC
  Filled 2022-06-23: qty 5

## 2022-06-23 MED ORDER — CHLORHEXIDINE GLUCONATE 0.12 % MT SOLN
15.0000 mL | Freq: Once | OROMUCOSAL | Status: AC
  Administered 2022-06-23: 15 mL via OROMUCOSAL
  Filled 2022-06-23: qty 15

## 2022-06-23 MED ORDER — CEFAZOLIN SODIUM-DEXTROSE 2-4 GM/100ML-% IV SOLN
2.0000 g | INTRAVENOUS | Status: DC
  Filled 2022-06-23: qty 100

## 2022-06-23 MED ORDER — ORAL CARE MOUTH RINSE: 15.0000 mL | Freq: Once | OROMUCOSAL | Status: AC

## 2022-06-23 MED ORDER — LACTATED RINGERS IV SOLN: INTRAVENOUS | Status: DC

## 2022-06-23 MED ORDER — MIDAZOLAM HCL 2 MG/2ML IJ SOLN
INTRAMUSCULAR | Status: AC
  Filled 2022-06-23: qty 2

## 2022-06-23 NOTE — Progress Notes (Signed)
Patient arrived in short stay; alert and oriented x 4; no acute distress noted. CBG 355 - patient didn't have his diabetes medication this AM. No orders at this time from the on-call MD. Will continue to monitor.

## 2022-06-23 NOTE — Telephone Encounter (Signed)
Sch appt  I need to review glucose readings with  him to be able to discuss which medication is better  Please sch  Can be video

## 2022-06-23 NOTE — H&P (Signed)
  Patient is currently on oral medications for his diabetes.  Patient presents with glucose over 300.  Patient has not been on insulin for his diabetes.  After discussion with anesthesia it would be safer for patient to proceed with improved glucose management and then proceed with elective surgical reconstruction of the ankle.

## 2022-06-23 NOTE — Telephone Encounter (Signed)
Pt would like to be called in regards to getting on ozempic

## 2022-06-23 NOTE — Progress Notes (Signed)
Plan discussed with Dr. Lajoyce Corners, surgery cancelled. IV removed, belongings packed, discharged home.

## 2022-06-23 NOTE — Telephone Encounter (Signed)
Spoke to pt in regards to beginning Ozempic or Jardiance? Pt would like to begin one but would like to know what you would reccommend

## 2022-06-26 NOTE — Telephone Encounter (Signed)
Pt is scheduled for 06/28/2022

## 2022-06-28 ENCOUNTER — Ambulatory Visit (INDEPENDENT_AMBULATORY_CARE_PROVIDER_SITE_OTHER): Payer: BC Managed Care – PPO | Admitting: Family

## 2022-06-28 ENCOUNTER — Encounter: Payer: Self-pay | Admitting: Family

## 2022-06-28 VITALS — BP 136/76 | HR 95 | Temp 98.7°F | Wt 201.0 lb

## 2022-06-28 DIAGNOSIS — B958 Unspecified staphylococcus as the cause of diseases classified elsewhere: Secondary | ICD-10-CM | POA: Diagnosis not present

## 2022-06-28 DIAGNOSIS — E119 Type 2 diabetes mellitus without complications: Secondary | ICD-10-CM | POA: Diagnosis not present

## 2022-06-28 DIAGNOSIS — R7309 Other abnormal glucose: Secondary | ICD-10-CM | POA: Diagnosis not present

## 2022-06-28 LAB — MICROALBUMIN / CREATININE URINE RATIO
Creatinine,U: 98.9 mg/dL
Microalb Creat Ratio: 5.8 mg/g (ref 0.0–30.0)
Microalb, Ur: 5.8 mg/dL — ABNORMAL HIGH (ref 0.0–1.9)

## 2022-06-28 LAB — POCT GLYCOSYLATED HEMOGLOBIN (HGB A1C): Hemoglobin A1C: 10.5 % — AB (ref 4.0–5.6)

## 2022-06-28 MED ORDER — MUPIROCIN 2 % EX OINT: 1.0000 | TOPICAL_OINTMENT | Freq: Three times a day (TID) | CUTANEOUS | 0 refills | Status: AC

## 2022-06-28 MED ORDER — OZEMPIC (0.25 OR 0.5 MG/DOSE) 2 MG/1.5ML ~~LOC~~ SOPN: 0.2500 mg | PEN_INJECTOR | SUBCUTANEOUS | 3 refills | Status: DC

## 2022-06-28 NOTE — Progress Notes (Signed)
Subjective:    Patient ID: Andrew Loma., male    DOB: 1970/08/12, 51 y.o.   MRN: [id number]  CC: Andrew Vanessen Gose Brooke Bonito. is a 51 y.o. male who presents today for follow up.   HPI: Endorses grief today as father passed away last week.  Hard for him to see him decompensate as he did.  He has not been particularly focused on his blood sugar while caring for his father   He has access to grief counselors through hospice.  Hyperlipidemia-compliant with Crestor 19m DM-compliant with glipizide 10 mg, metformin 1000 twice daily No personal or family history of thyroid cancer.  Previously been on Ozempic and tolerated well without constipation  Right ankle surgery was postponed, Dr. DSharol Given due to hyperglycemia ( glucose 300)  Staph positive in the nose. negative MRSA. Corner of nose, he had had ingrown hair, since resolved.  HISTORY:  Past Medical History:  Diagnosis Date   Chicken pox    [place]    History of kidney stones    Motion sickness    back seat of car   Umbilical hernia 070/35/0093  Wears contact lenses    Past Surgical History:  Procedure Laterality Date   ANKLE SURGERY     x2   COLONOSCOPY WITH PROPOFOL N/A 05/03/2020   Procedure: COLONOSCOPY WITH PROPOFOL;  Surgeon: WLucilla Lame MD;  [facility]  Service: Endoscopy;  Laterality: N/A;  Diabetic - oral meds priority 4   EPIGASTRIC HERNIA REPAIR N/A 03/30/2015   Procedure: HERNIA REPAIR EPIGASTRIC ADULT;  Surgeon: JRobert Bellow MD;  Location: [facility];  Service: General;  Laterality: N/A;   HERNIA REPAIR  [id number]  umbilical hernia   HERNIA REPAIR  03/30/2015   Epigastric and umbilical hernia with retrorectus 6.4 cm Ventralex mesh   left and right ankle repair ligament     nasal spectrum     rotator cuff right     SHOULDER SURGERY     rotator cuff   Family History  Problem Relation Age of Onset   Hypertension Mother    Hyperlipidemia Father    Leukemia Father 617       s/p transplant x 2, died from PML   Diabetes Maternal Grandmother    Heart disease Maternal Grandfather    Hyperlipidemia Maternal Grandfather    Heart disease Paternal Grandfather    Hyperlipidemia Paternal Grandfather    Thyroid cancer Neg Hx     Allergies: Patient has no known allergies. Current Outpatient Medications on File Prior to Visit  Medication Sig Dispense Refill   APPLE CIDER VINEGAR PO Take 15 mLs by mouth 3 (three) times a week.     Aspirin-Acetaminophen-Caffeine (GOODYS EXTRA STRENGTH) 500-325-65 MG PACK Take 1 packet by mouth daily as needed (pain.).     blood glucose meter kit and supplies Dispense based on patient and insurance preference. Use up to four times daily as directed. (FOR ICD-10 E10.9, E11.9). 1 each 0   glipiZIDE (GLUCOTROL XL) 10 MG 24 hr tablet Take 1 tablet (10 mg total) by mouth daily with breakfast. (Patient taking differently: Take 10 mg by mouth daily with lunch.) 90 tablet 1   glucose blood (ACCU-CHEK GUIDE) test strip Use to check blood sugar twice daily 200 each 3   ibuprofen (ADVIL) 200 MG tablet Take 200 mg by mouth daily as needed (pain.).     MAGNESIUM PO Take 1 tablet by mouth every evening.  metFORMIN (GLUCOPHAGE) 1000 MG tablet TAKE 1 TABLET(1000 MG) BY MOUTH TWICE DAILY WITH A MEAL 180 tablet 3   Multiple Vitamins-Minerals (ZINC PO) Take 1 tablet by mouth daily in the afternoon.     NON FORMULARY Take 1 capsule by mouth daily. [place] supplement     Omega-3 Fatty Acids (FISH OIL PO) Take 1 capsule by mouth in the morning.     OVER THE COUNTER MEDICATION Take 1 tablet by mouth daily. Shilajit Extract     rosuvastatin (CRESTOR) 10 MG tablet TAKE 1 TABLET(10 MG) BY MOUTH DAILY (Patient taking differently: Take 10 mg by mouth every evening.) 90 tablet 3   No current facility-administered medications on file prior to visit.    Social History   Tobacco Use   Smoking status: Never   Smokeless tobacco: Never  Vaping Use   Vaping  Use: Never used  Substance Use Topics   Alcohol use: Yes    Alcohol/week: 0.0 standard drinks of alcohol    Comment: 2x month   Drug use: No    Review of Systems  Constitutional:  Negative for chills and fever.  Respiratory:  Negative for cough.   Cardiovascular:  Negative for chest pain and palpitations.  Gastrointestinal:  Negative for nausea and vomiting.      Objective:    BP 136/76 (BP Location: Left Arm, Patient Position: Sitting, Cuff Size: Normal)   Pulse 95   Temp 98.7 F (37.1 C) (Oral)   Wt 201 lb (91.2 kg)   SpO2 95%   BMI 30.56 kg/m  BP Readings from Last 3 Encounters:  06/28/22 136/76  06/23/22 (!) 145/90  05/23/22 130/86   Wt Readings from Last 3 Encounters:  06/28/22 201 lb (91.2 kg)  06/23/22 200 lb (90.7 kg)  05/23/22 206 lb (93.4 kg)    Physical Exam Vitals reviewed.  Constitutional:      Appearance: He is well-developed.  Cardiovascular:     Rate and Rhythm: Regular rhythm.     Heart sounds: Normal heart sounds.  Pulmonary:     Effort: Pulmonary effort is normal. No respiratory distress.     Breath sounds: Normal breath sounds. No wheezing, rhonchi or rales.  Skin:    General: Skin is warm and dry.  Neurological:     Mental Status: He is alert.  Psychiatric:        Speech: Speech normal.        Behavior: Behavior normal.        Assessment & Plan:   Problem List Items Addressed This Visit       Endocrine   Diabetes mellitus without complication ([place]) - Primary    Lab Results  Component Value Date   HGBA1C 10.6 (A) 03/28/2022  Elevated.  Patient will continue glipizide 10 mg, metformin 1000 twice daily.  Previously on Ozempic and tolerated well.  Counseled on blackbox warning as it relates to medullary thyroid cancer, multiple endocrine neoplasia.  Patient's insurance will change in January and he feels more certain that Ozempic would be covered at that point.  We then opted to start trial of Ozempic to get [place] to the new year in  which at that point we could prescribe through his new insurance.  We also discussed changing glipizide to Jardiance for additional cardiovascular event protection.      Relevant Medications   Semaglutide,0.25 or 0.5MG/DOS, (OZEMPIC, 0.25 OR 0.5 MG/DOSE,) 2 MG/1.5ML SOPN     Other   Staphylococcal infection    Incidental  with surgical screen.  Negative MRSA.  Positive staph.No nasal congestion.  We opted to treat empirically for MRSA as most Staph infections are associated with MRSA.       Relevant Medications   mupirocin ointment (BACTROBAN) 2 %   Other Visit Diagnoses     Elevated glucose       Relevant Orders   POCT HgB A1C   Urine Microalbumin w/creat. ratio        I am having Andrew Bushy. Chipps Jr. "Sonny" start on mupirocin ointment and Ozempic (0.25 or 0.5 MG/DOSE). I am also having him maintain his blood glucose meter kit and supplies, APPLE CIDER VINEGAR PO, Accu-Chek Guide, rosuvastatin, glipiZIDE, metFORMIN, Multiple Vitamins-Minerals (ZINC PO), MAGNESIUM PO, Omega-3 Fatty Acids (FISH OIL PO), Goodys Extra Strength, ibuprofen, NON FORMULARY, and OVER THE COUNTER MEDICATION.   Meds ordered this encounter  Medications   mupirocin ointment (BACTROBAN) 2 %    Sig: Apply 1 Application topically 3 (three) times daily for 7 days. Apply to bilateral nasal cavity    Dispense:  22 g    Refill:  0    Order Specific Question:   Supervising Provider    Answer:   Deborra Medina L [[id number]]   Semaglutide,0.25 or 0.5MG/DOS, (OZEMPIC, 0.25 OR 0.5 MG/DOSE,) 2 MG/1.5ML SOPN    Sig: Inject 0.25 mg into the skin once a week. After 4 weeks, increase to 0.65m Andrew qwk.    Dispense:  3 mL    Refill:  3    Order Specific Question:   Supervising Provider    Answer:   TCrecencio Mc[[id number]]    Return precautions given.   Risks, benefits, and alternatives of the medications and treatment plan prescribed today were discussed, and patient expressed understanding.   Education regarding symptom  management and diagnosis given to patient on AVS.  Continue to follow with ABurnard Hawthorne FNP for routine health maintenance.   WGrace BushyLoftis Jr. and I agreed with plan.   MMable Paris FNP

## 2022-06-28 NOTE — Progress Notes (Signed)
Discussed during OV. Please see OV notes today

## 2022-06-28 NOTE — Patient Instructions (Signed)
Start antibiotic ointment in place in both nasal openings.  Start ozempic 0.25mg  once per week once per week injected subcutaneously ( [place])  in stomach. Please clean with alcohol swab prior to injection and be sure to rotate site. You may schedule a nurse visit if you would like to first injection.   After 4 weeks, and if tolerated and weight loss has not reached 1-2 lbs per week, please increase to 0.5mg  once per week [place].    Please read information on medication below and remember black box warning that you may not take if you or a family member is diagnosed with thyroid cancer (medullary thyroid cancer), or multiple endocrine neoplasia ( MEN).       Semaglutide injection solution What is this medicine? SEMAGLUTIDE (Sem a GLOO tide) is used to improve blood sugar control in adults with type 2 diabetes. This medicine may be used with other diabetes medicines. This drug may also reduce the risk of heart attack or stroke if you have type 2 diabetes and risk factors for heart disease. This medicine may be used for other purposes; ask your health care provider or pharmacist if you have questions. COMMON BRAND NAME(S): OZEMPIC What should I tell my health care provider before I take this medicine? They need to know if you have any of these conditions: endocrine tumors (MEN 2) or if someone in your family had these tumors eye disease, vision problems history of pancreatitis kidney disease stomach problems thyroid cancer or if someone in your family had thyroid cancer an unusual or allergic reaction to semaglutide, other medicines, foods, dyes, or preservatives pregnant or trying to get pregnant breast-feeding How should I use this medicine? This medicine is for injection under the skin of your upper leg (thigh), stomach area, or upper arm. It is given once every week (every 7 days). You will be taught how to prepare and give this medicine. Use exactly as directed. Take your medicine at regular  intervals. Do not take it more often than directed. If you use this medicine with insulin, you should inject this medicine and the insulin separately. Do not mix them together. Do not give the injections right next to each other. Change (rotate) injection sites with each injection. It is important that you put your used needles and syringes in a special sharps container. Do not put them in a trash can. If you do not have a sharps container, call your pharmacist or healthcare provider to get one. A special MedGuide will be given to you by the pharmacist with each prescription and refill. Be sure to read this information carefully each time. This drug comes with INSTRUCTIONS FOR USE. Ask your pharmacist for directions on how to use this drug. Read the information carefully. Talk to your pharmacist or health care provider if you have questions. Talk to your pediatrician regarding the use of this medicine in children. Special care may be needed. Overdosage: If you think you have taken too much of this medicine contact a poison control center or emergency room at once. NOTE: This medicine is only for you. Do not share this medicine with others. What if I miss a dose? If you miss a dose, take it as soon as you can within 5 days after the missed dose. Then take your next dose at your regular weekly time. If it has been longer than 5 days after the missed dose, do not take the missed dose. Take the next dose at your regular time. Do  not take double or extra doses. If you have questions about a missed dose, contact your health care provider for advice. What may interact with this medicine? other medicines for diabetes Many medications may cause changes in blood sugar, these include: alcohol containing beverages antiviral medicines for HIV or AIDS aspirin and aspirin-like drugs certain medicines for blood pressure, heart disease, irregular heart beat chromium diuretics male hormones, such as estrogens or  progestins, birth control pills fenofibrate gemfibrozil isoniazid lanreotide male hormones or anabolic steroids MAOIs like Carbex, Eldepryl, Marplan, Nardil, and Parnate medicines for weight loss medicines for allergies, asthma, cold, or cough medicines for depression, anxiety, or psychotic disturbances niacin nicotine NSAIDs, medicines for pain and inflammation, like ibuprofen or naproxen octreotide pasireotide pentamidine phenytoin probenecid quinolone antibiotics such as ciprofloxacin, levofloxacin, ofloxacin some herbal dietary supplements steroid medicines such as prednisone or cortisone sulfamethoxazole; trimethoprim thyroid hormones Some medications can hide the warning symptoms of low blood sugar (hypoglycemia). You may need to monitor your blood sugar more closely if you are taking one of these medications. These include: beta-blockers, often used for high blood pressure or heart problems (examples include atenolol, metoprolol, propranolol) clonidine guanethidine reserpine This list may not describe all possible interactions. Give your health care provider a list of all the medicines, herbs, non-prescription drugs, or dietary supplements you use. Also tell them if you smoke, drink alcohol, or use illegal drugs. Some items may interact with your medicine. What should I watch for while using this medicine? Visit your doctor or health care professional for regular checks on your progress. Drink plenty of fluids while taking this medicine. Check with your doctor or health care professional if you get an attack of severe diarrhea, nausea, and vomiting. The loss of too much body fluid can make it dangerous for you to take this medicine. A test called the HbA1C (A1C) will be monitored. This is a simple blood test. It measures your blood sugar control over the last 2 to 3 months. You will receive this test every 3 to 6 months. Learn how to check your blood sugar. Learn the symptoms  of low and high blood sugar and how to manage them. Always carry a quick-source of sugar with you in case you have symptoms of low blood sugar. Examples include hard sugar candy or glucose tablets. Make sure others know that you can choke if you eat or drink when you develop serious symptoms of low blood sugar, such as seizures or unconsciousness. They must get medical help at once. Tell your doctor or health care professional if you have high blood sugar. You might need to change the dose of your medicine. If you are sick or exercising more than usual, you might need to change the dose of your medicine. Do not skip meals. Ask your doctor or health care professional if you should avoid alcohol. Many nonprescription cough and cold products contain sugar or alcohol. These can affect blood sugar. Pens should never be shared. Even if the needle is changed, sharing may result in passing of viruses like hepatitis or HIV. Wear a medical ID bracelet or chain, and carry a card that describes your disease and details of your medicine and dosage times. Do not become pregnant while taking this medicine. Women should inform their doctor if they wish to become pregnant or think they might be pregnant. There is a potential for serious side effects to an unborn child. Talk to your health care professional or pharmacist for more information. What side  effects may I notice from receiving this medicine? Side effects that you should report to your doctor or health care professional as soon as possible: allergic reactions like skin rash, itching or hives, swelling of the face, lips, or tongue breathing problems changes in vision diarrhea that continues or is severe lump or swelling on the neck severe nausea signs and symptoms of infection like fever or chills; cough; sore throat; pain or trouble passing urine signs and symptoms of low blood sugar such as feeling anxious, confusion, dizziness, increased hunger, unusually  weak or tired, sweating, shakiness, cold, irritable, headache, blurred vision, fast heartbeat, loss of consciousness signs and symptoms of kidney injury like trouble passing urine or change in the amount of urine trouble swallowing unusual stomach upset or pain vomiting Side effects that usually do not require medical attention (report to your doctor or health care professional if they continue or are bothersome): constipation diarrhea nausea pain, redness, or irritation at site where injected stomach upset This list may not describe all possible side effects. Call your doctor for medical advice about side effects. You may report side effects to FDA at 1-800-FDA-1088. Where should I keep my medicine? Keep out of the reach of children. Store unopened pens in a refrigerator between 2 and 8 degrees C (36 and 46 degrees F). Do not freeze. Protect from light and heat. After you first use the pen, it can be stored for 56 days at room temperature between 15 and 30 degrees C (59 and 86 degrees F) or in a refrigerator. Throw away your used pen after 56 days or after the expiration date, whichever comes first. Do not store your pen with the needle attached. If the needle is left on, medicine may leak from the pen. NOTE: This sheet is a summary. It may not cover all possible information. If you have questions about this medicine, talk to your doctor, pharmacist, or health care provider.  2021 Elsevier/Gold Standard (2019-04-08 09:41:51)

## 2022-06-28 NOTE — Assessment & Plan Note (Signed)
Lab Results  Component Value Date   HGBA1C 10.6 (A) 03/28/2022   Elevated.  Patient will continue glipizide 10 mg, metformin 1000 twice daily.  Previously on Ozempic and tolerated well.  Counseled on blackbox warning as it relates to medullary thyroid cancer, multiple endocrine neoplasia.  Patient's insurance will change in January and he feels more certain that Ozempic would be covered at that point.  We then opted to start trial of Ozempic to get [place] to the new year in which at that point we could prescribe through his new insurance.  We also discussed changing glipizide to Jardiance for additional cardiovascular event protection.

## 2022-06-28 NOTE — Assessment & Plan Note (Addendum)
Incidental with surgical screen.  Negative MRSA.  Positive staph.No nasal congestion.  We opted to treat empirically for MRSA as most Staph infections are associated with MRSA.

## 2022-07-07 ENCOUNTER — Encounter: Payer: BC Managed Care – PPO | Admitting: Family

## 2022-08-28 ENCOUNTER — Telehealth: Payer: Self-pay | Admitting: Family

## 2022-08-28 DIAGNOSIS — E119 Type 2 diabetes mellitus without complications: Secondary | ICD-10-CM

## 2022-08-28 NOTE — Telephone Encounter (Signed)
Pt need a refill on ozempic sent to [facility] and it did not know if the provider wanted him to increase or not

## 2022-08-29 MED ORDER — OZEMPIC (0.25 OR 0.5 MG/DOSE) 2 MG/1.5ML ~~LOC~~ SOPN: 0.5000 mg | PEN_INJECTOR | SUBCUTANEOUS | 3 refills | Status: DC

## 2022-08-29 NOTE — Telephone Encounter (Signed)
LVM to call back to  confirm if he has done 4 weeks of ozempic 0.25mg  and then 4 weeks of ozempic 0.5mg   What are fasting blood sugars

## 2022-08-29 NOTE — Telephone Encounter (Signed)
Spoke to pt and informed him that his rx for Ozempic  0.5 mg was  sent into the pharmacy and he has f/up appt for 09/29/22

## 2022-08-29 NOTE — Addendum Note (Signed)
Addended by: [place], Kasch Borquez on: 08/29/2022 12:01 PM   Modules accepted: Orders

## 2022-08-29 NOTE — Telephone Encounter (Signed)
Spoke to pt and he has done 4 weeks of 0.25 mg Ozempic and he is on his 4th week of the 0.5 mg. Pt stated that he does not have record of fasting blood sugars but he would start taking it. He did say that his non fasting had not been more than 202 since he has been on the Ozempic .

## 2022-08-29 NOTE — Telephone Encounter (Signed)
Pt called back. Tranfered to [place].

## 2022-09-08 ENCOUNTER — Telehealth: Payer: Self-pay

## 2022-09-08 NOTE — Telephone Encounter (Signed)
Medication Samples have been provided to the patient.  Drug name: Ozempic     Strength: 0.5mg       Qty: 1   Dosing instructions: 0.5mg  into skin weekly   The patient has been instructed regarding the correct time, dose, and frequency of taking this medication, including desired effects and most common side effects.   Ok to give per Tomasita Morrow, NP  Zacarias Pontes 2:36 PM 09/08/2022

## 2022-09-11 ENCOUNTER — Other Ambulatory Visit (HOSPITAL_COMMUNITY): Payer: Self-pay

## 2022-09-11 NOTE — Telephone Encounter (Signed)
Pharmacy Patient Advocate Encounter   Received notification that prior authorization for Ozempic (0.25 or 0.5 MG/DOSE) 2MG /3ML pen-injectors is required/requested.  Per Test Claim: PA required, diagnosis code needed   PA submitted on 09/11/22 to (ins) [employer] via CoverMyMeds Key [id number] Status is pending

## 2022-09-12 NOTE — Telephone Encounter (Signed)
Patient Advocate Encounter  Prior Authorization for Ozempic (0.25 or 0.5 MG/DOSE) 2MG /3ML pen-injectors has been approved.    PA# [id number] Effective dates: 09/11/22 through 09/12/23

## 2022-09-12 NOTE — Telephone Encounter (Signed)
Spoke to pt and informed him that his Ozempic has been approved from 2/24 - 2/25

## 2022-09-29 ENCOUNTER — Ambulatory Visit: Payer: BC Managed Care – PPO | Admitting: Family

## 2022-10-04 ENCOUNTER — Ambulatory Visit: Payer: 59 | Admitting: Family

## 2022-10-04 ENCOUNTER — Encounter: Payer: Self-pay | Admitting: Family

## 2022-10-04 VITALS — BP 130/70 | HR 79 | Temp 98.5°F | Ht 68.0 in | Wt 197.8 lb

## 2022-10-04 DIAGNOSIS — E119 Type 2 diabetes mellitus without complications: Secondary | ICD-10-CM

## 2022-10-04 DIAGNOSIS — R7309 Other abnormal glucose: Secondary | ICD-10-CM

## 2022-10-04 DIAGNOSIS — E785 Hyperlipidemia, unspecified: Secondary | ICD-10-CM

## 2022-10-04 LAB — MICROALBUMIN / CREATININE URINE RATIO
Creatinine,U: 168.6 mg/dL
Microalb Creat Ratio: 7.8 mg/g (ref 0.0–30.0)
Microalb, Ur: 13.2 mg/dL — ABNORMAL HIGH (ref 0.0–1.9)

## 2022-10-04 LAB — POCT GLYCOSYLATED HEMOGLOBIN (HGB A1C): Hemoglobin A1C: 8.8 % — AB (ref 4.0–5.6)

## 2022-10-04 MED ORDER — SEMAGLUTIDE (1 MG/DOSE) 4 MG/3ML ~~LOC~~ SOPN: 1.0000 mg | PEN_INJECTOR | SUBCUTANEOUS | 2 refills | Status: DC

## 2022-10-04 NOTE — Assessment & Plan Note (Addendum)
Lab Results  Component Value Date   HGBA1C 8.8 (A) 10/04/2022   Significantly improved.  He started Ozempic 2 months ago.  Provided patient with a sample pack of Jardiance 0.25, 0.5 mg and directed him to use 0.5 mg qwk.  Once he is used up sample, he will start Ozempic 1 mg.  Consider losartan if proteinuria persists

## 2022-10-04 NOTE — Assessment & Plan Note (Signed)
Lab Results Component Value Date  LDLCALC 43 12/26/2021  Excellent control.  Continue Crestor 10 mg qd

## 2022-10-04 NOTE — Progress Notes (Signed)
Assessment & Plan:  Diabetes mellitus without complication [facility]) Assessment & Plan: Lab Results  Component Value Date   HGBA1C 8.8 (A) 10/04/2022   Significantly improved.  He started Ozempic 2 months ago.  Provided patient with a sample pack of Jardiance 0.25, 0.5 mg and directed him to use 0.5 mg qwk.  Once he is used up sample, he will start Ozempic 1 mg.  Consider losartan if proteinuria persists  Orders: -     Semaglutide (1 MG/DOSE); Inject 1 mg as directed once a week.  Dispense: 3 mL; Refill: 2 -     Microalbumin / creatinine urine ratio  Elevated glucose -     POCT glycosylated hemoglobin (Hb A1C)  Hyperlipidemia, unspecified hyperlipidemia type Assessment & Plan: Lab Results Component Value Date  LDLCALC 43 12/26/2021  Excellent control.  Continue Crestor 10 mg qd      Return precautions given.   Risks, benefits, and alternatives of the medications and treatment plan prescribed today were discussed, and patient expressed understanding.   Education regarding symptom management and diagnosis given to patient on AVS either electronically or printed.  Return in about 3 months (around 01/02/2023).  [name], FNP  Subjective:    Patient ID: Andrew Holloway., male    DOB: 1971/05/22, 52 y.o.   MRN: [id number]  CC: Andrew Holloway. is a 52 y.o. male who presents today for follow up.   HPI: Feels well today.  No new complaints.  Has been compliant metformin.  He has been taking Ozempic 0.5 mg and tolerating well.  He has run out of Ozempic  and pharmacy has not had medication. denies nausea, constipation.  FBG 160.   He remains compliant with Crestor 10 mg    Allergies: Patient has no known allergies. Current Outpatient Medications on File Prior to Visit  Medication Sig Dispense Refill   APPLE CIDER VINEGAR PO Take 15 mLs by mouth 3 (three) times a week.     Aspirin-Acetaminophen-Caffeine (GOODYS EXTRA STRENGTH) 500-325-65 MG PACK Take 1 packet by  mouth daily as needed (pain.).     blood glucose meter kit and supplies Dispense based on patient and insurance preference. Use up to four times daily as directed. (FOR ICD-10 E10.9, E11.9). 1 each 0   glucose blood (ACCU-CHEK GUIDE) test strip Use to check blood sugar twice daily 200 each 3   ibuprofen (ADVIL) 200 MG tablet Take 200 mg by mouth daily as needed (pain.).     MAGNESIUM PO Take 1 tablet by mouth every evening.     metFORMIN (GLUCOPHAGE) 1000 MG tablet TAKE 1 TABLET(1000 MG) BY MOUTH TWICE DAILY WITH A MEAL 180 tablet 3   Multiple Vitamins-Minerals (ZINC PO) Take 1 tablet by mouth daily in the afternoon.     NON FORMULARY Take 1 capsule by mouth daily. Lion's Mane supplement     Omega-3 Fatty Acids (FISH OIL PO) Take 1 capsule by mouth in the morning.     OVER THE COUNTER MEDICATION Take 1 tablet by mouth daily. Shilajit Extract     rosuvastatin (CRESTOR) 10 MG tablet TAKE 1 TABLET(10 MG) BY MOUTH DAILY (Patient taking differently: Take 10 mg by mouth every evening.) 90 tablet 3   No current facility-administered medications on file prior to visit.    Review of Systems  Constitutional:  Negative for chills and fever.  Respiratory:  Negative for cough.   Cardiovascular:  Negative for chest pain and palpitations.  Gastrointestinal:  Negative for  constipation, nausea and vomiting.      Objective:    BP 130/70   Pulse 79   Temp 98.5 F (36.9 C) (Oral)   Ht '5\' 8"'$  (1.727 m)   Wt 197 lb 12.8 oz (89.7 kg)   SpO2 97%   BMI 30.08 kg/m  BP Readings from Last 3 Encounters:  10/04/22 130/70  06/28/22 136/76  06/23/22 (!) 145/90   Wt Readings from Last 3 Encounters:  10/04/22 197 lb 12.8 oz (89.7 kg)  06/28/22 201 lb (91.2 kg)  06/23/22 200 lb (90.7 kg)    Physical Exam Vitals reviewed.  Constitutional:      Appearance: He is well-developed.  Cardiovascular:     Rate and Rhythm: Regular rhythm.     Heart sounds: Normal heart sounds.  Pulmonary:     Effort: Pulmonary  effort is normal. No respiratory distress.     Breath sounds: Normal breath sounds. No wheezing, rhonchi or rales.  Skin:    General: Skin is warm and dry.  Neurological:     Mental Status: He is alert.  Psychiatric:        Speech: Speech normal.        Behavior: Behavior normal.

## 2022-10-09 ENCOUNTER — Other Ambulatory Visit: Payer: Self-pay

## 2022-10-09 ENCOUNTER — Telehealth: Payer: Self-pay

## 2022-10-09 DIAGNOSIS — I10 Essential (primary) hypertension: Secondary | ICD-10-CM

## 2022-10-09 DIAGNOSIS — E119 Type 2 diabetes mellitus without complications: Secondary | ICD-10-CM

## 2022-10-09 MED ORDER — LOSARTAN POTASSIUM 25 MG PO TABS: 25.0000 mg | ORAL_TABLET | Freq: Every day | ORAL | 0 refills | Status: DC

## 2022-10-09 NOTE — Telephone Encounter (Signed)
-----   Message from Burnard Hawthorne, [place] sent at 10/08/2022  8:44 PM EST ----- Review result note.  If agreeable, please send losartan 25 mg daily for 90-day supply with refill.  Please order BMP lab and schedule in 1 week after starting medication

## 2022-10-09 NOTE — Telephone Encounter (Signed)
Pt returned Marissa called. Note below was read to pt. Call was transferred to [facility].

## 2022-10-09 NOTE — Telephone Encounter (Signed)
Lm for pt to cb re ::  Andrew Holloway,   Your kidneys are leaking more protein than they had previously. This is evidence of kidney damage. It is very important to control blood pressure ( goal < 130/80) and diabetes ( goal a1c < 6.5%) to prevent further damage. Are you willing to start a medication called losartan '25mg'$  ( very low dose) to prevent further kidney damage?     Regards, Joycelyn Schmid

## 2022-10-12 ENCOUNTER — Telehealth: Payer: Self-pay | Admitting: Family

## 2022-10-12 NOTE — Telephone Encounter (Signed)
Orders are in

## 2022-10-12 NOTE — Addendum Note (Signed)
Addended by: [place], Ananya Mccleese on: 10/12/2022 02:56 PM   Modules accepted: Orders

## 2022-10-12 NOTE — Telephone Encounter (Signed)
Patient has a lab appt 10/20/2022, there are No orders in.

## 2022-10-20 ENCOUNTER — Other Ambulatory Visit: Payer: 59

## 2022-10-20 NOTE — Addendum Note (Signed)
Addended by: Neta Ehlers on: 10/20/2022 01:18 PM   Modules accepted: Orders

## 2022-11-22 LAB — HM DIABETES EYE EXAM

## 2023-01-05 ENCOUNTER — Ambulatory Visit: Payer: 59 | Admitting: Family

## 2023-01-10 ENCOUNTER — Other Ambulatory Visit: Payer: Self-pay | Admitting: Family

## 2023-01-26 ENCOUNTER — Ambulatory Visit: Payer: 59 | Admitting: Family

## 2023-01-26 ENCOUNTER — Encounter: Payer: Self-pay | Admitting: Family

## 2023-01-26 VITALS — BP 120/76 | HR 88 | Temp 98.0°F | Ht 68.0 in | Wt 195.6 lb

## 2023-01-26 DIAGNOSIS — R21 Rash and other nonspecific skin eruption: Secondary | ICD-10-CM

## 2023-01-26 DIAGNOSIS — Z7985 Long-term (current) use of injectable non-insulin antidiabetic drugs: Secondary | ICD-10-CM

## 2023-01-26 DIAGNOSIS — Z7984 Long term (current) use of oral hypoglycemic drugs: Secondary | ICD-10-CM

## 2023-01-26 DIAGNOSIS — E119 Type 2 diabetes mellitus without complications: Secondary | ICD-10-CM

## 2023-01-26 DIAGNOSIS — E1165 Type 2 diabetes mellitus with hyperglycemia: Secondary | ICD-10-CM

## 2023-01-26 DIAGNOSIS — E785 Hyperlipidemia, unspecified: Secondary | ICD-10-CM

## 2023-01-26 LAB — LIPID PANEL
Cholesterol: 96 mg/dL (ref 0–200)
HDL: 34 mg/dL — ABNORMAL LOW (ref >39.00)
LDL Cholesterol: 36 mg/dL (ref 0–99)
NonHDL: 62.42
Total CHOL/HDL Ratio: 3
Triglycerides: 131 mg/dL (ref 0.0–149.0)
VLDL: 26.2 mg/dL (ref 0.0–40.0)

## 2023-01-26 LAB — POCT GLYCOSYLATED HEMOGLOBIN (HGB A1C): Hemoglobin A1C: 8.2 % — AB (ref 4.0–5.6)

## 2023-01-26 LAB — MICROALBUMIN / CREATININE URINE RATIO
Creatinine,U: 154.8 mg/dL
Microalb Creat Ratio: 2 mg/g (ref 0.0–30.0)
Microalb, Ur: 3.1 mg/dL — ABNORMAL HIGH (ref 0.0–1.9)

## 2023-01-26 LAB — BASIC METABOLIC PANEL
BUN: 14 mg/dL (ref 6–23)
CO2: 28 mEq/L (ref 19–32)
Calcium: 10 mg/dL (ref 8.4–10.5)
Chloride: 99 mEq/L (ref 96–112)
Creatinine, Ser: 0.86 mg/dL (ref 0.40–1.50)
GFR: 99.12 mL/min (ref >60.00)
Glucose, Bld: 174 mg/dL — ABNORMAL HIGH (ref 70–99)
Potassium: 4.3 mEq/L (ref 3.5–5.1)
Sodium: 137 mEq/L (ref 135–145)

## 2023-01-26 MED ORDER — MUPIROCIN 2 % EX OINT: 1.0000 | TOPICAL_OINTMENT | Freq: Two times a day (BID) | CUTANEOUS | 2 refills | Status: DC

## 2023-01-26 MED ORDER — LOSARTAN POTASSIUM 25 MG PO TABS: ORAL_TABLET | ORAL | 1 refills | Status: DC

## 2023-01-26 MED ORDER — ROSUVASTATIN CALCIUM 10 MG PO TABS: 10.0000 mg | ORAL_TABLET | Freq: Every day | ORAL | 3 refills | Status: DC

## 2023-01-26 MED ORDER — SEMAGLUTIDE (2 MG/DOSE) 8 MG/3ML ~~LOC~~ SOPN: 2.0000 mg | PEN_INJECTOR | SUBCUTANEOUS | 3 refills | Status: DC

## 2023-01-26 NOTE — Assessment & Plan Note (Signed)
Lab Results  Component Value Date   HGBA1C 8.2 (A) 01/26/2023   A1c is slightly improved.  Discussed goal of 6.5.  Increase Ozempic to 2 mg . Continue Metformin 1000 mg twice daily

## 2023-01-26 NOTE — Assessment & Plan Note (Signed)
Etiology unclear.  Question if folliculitis versus rosacea.  Trial of Bactroban.  Referral dermatology, particularly in the setting of history of basal cell carcinoma and as lesions noted in sun exposed area.  Advised patient to let me know if lesions were to change in any way

## 2023-01-26 NOTE — Progress Notes (Signed)
Assessment & Plan:  Type 2 diabetes mellitus with hyperglycemia, without long-term current use of insulin (HCC) -     POCT glycosylated hemoglobin (Hb A1C) -     Basic metabolic panel -     Microalbumin / creatinine urine ratio -     Semaglutide (2 MG/DOSE); Inject 2 mg as directed once a week.  Dispense: 3 mL; Refill: 3  Hyperlipidemia, unspecified hyperlipidemia type -     Rosuvastatin Calcium; Take 1 tablet (10 mg total) by mouth daily.  Dispense: 90 tablet; Refill: 3 -     Lipid panel  Diabetes mellitus without complication [facility]) Assessment & Plan: Lab Results  Component Value Date   HGBA1C 8.2 (A) 01/26/2023   A1c is slightly improved.  Discussed goal of 6.5.  Increase Ozempic to 2 mg . Continue Metformin 1000 mg twice daily   Rash Assessment & Plan: Etiology unclear.  Question if folliculitis versus rosacea.  Trial of Bactroban.  Referral dermatology, particularly in the setting of history of basal cell carcinoma and as lesions noted in sun exposed area.  Advised patient to let me know if lesions were to change in any way  Orders: -     Mupirocin; Apply 1 Application topically 2 (two) times daily.  Dispense: 22 g; Refill: 2 -     Ambulatory referral to Dermatology  Other orders -     Losartan Potassium; TAKE 1 TABLET(25 MG) BY MOUTH DAILY  Dispense: 90 tablet; Refill: 1     Return precautions given.   Risks, benefits, and alternatives of the medications and treatment plan prescribed today were discussed, and patient expressed understanding.   Education regarding symptom management and diagnosis given to patient on AVS either electronically or printed.  Return in about 3 months (around 04/28/2023).  [name], FNP  Subjective:    Patient ID: Andrew Holloway., male    DOB: [birth year], 52 y.o.   MRN: [id number]  CC: Andrew Holloway. is a 52 y.o. male who presents today for follow up.   HPI: Feels well today.  No new complaints.  He does note in the  past few weeks bilateral papular rash on his cheeks.  He describes as being an area where he places his finger when placing his contacts every day.  He is not sure if this has aggravated.  Rash is not tender, bleeding.  He does have a history of basal cell carcinoma right side of his neck.  He is no longer following with dermatology.  He is using his wife's over-the-counter skin cream and rash is improving. No fever  Compliant with Ozempic 1 mg, metformin 1000 mg twice daily.  He is tolerating regimen well    Allergies: Patient has no known allergies. Current Outpatient Medications on File Prior to Visit  Medication Sig Dispense Refill   APPLE CIDER VINEGAR PO Take 15 mLs by mouth 3 (three) times a week.     Aspirin-Acetaminophen-Caffeine (GOODYS EXTRA STRENGTH) 500-325-65 MG PACK Take 1 packet by mouth daily as needed (pain.).     blood glucose meter kit and supplies Dispense based on patient and insurance preference. Use up to four times daily as directed. (FOR ICD-10 E10.9, E11.9). 1 each 0   glucose blood (ACCU-CHEK GUIDE) test strip Use to check blood sugar twice daily 200 each 3   ibuprofen (ADVIL) 200 MG tablet Take 200 mg by mouth daily as needed (pain.).     MAGNESIUM PO Take 1 tablet by mouth  every evening.     metFORMIN (GLUCOPHAGE) 1000 MG tablet TAKE 1 TABLET(1000 MG) BY MOUTH TWICE DAILY WITH A MEAL 180 tablet 3   Multiple Vitamins-Minerals (ZINC PO) Take 1 tablet by mouth daily in the afternoon.     NON FORMULARY Take 1 capsule by mouth daily. Lion's Mane supplement     Omega-3 Fatty Acids (FISH OIL PO) Take 1 capsule by mouth in the morning.     OVER THE COUNTER MEDICATION Take 1 tablet by mouth daily. Shilajit Extract     No current facility-administered medications on file prior to visit.    Review of Systems  Constitutional:  Negative for chills and fever.  Respiratory:  Negative for cough.   Cardiovascular:  Negative for chest pain and palpitations.  Gastrointestinal:   Negative for nausea and vomiting.  Skin:  Positive for rash.      Objective:    BP 120/76   Pulse 88   Temp 98 F (36.7 C) (Oral)   Ht 5\' 8"  (1.727 m)   Wt 195 lb 9.6 oz (88.7 kg)   SpO2 97%   BMI 29.74 kg/m  BP Readings from Last 3 Encounters:  01/26/23 120/76  10/04/22 130/70  06/28/22 136/76   Wt Readings from Last 3 Encounters:  01/26/23 195 lb 9.6 oz (88.7 kg)  10/04/22 197 lb 12.8 oz (89.7 kg)  06/28/22 201 lb (91.2 kg)    Physical Exam Vitals reviewed.  Constitutional:      Appearance: He is well-developed.  HENT:     Head:      Comments: Mild centrofacial erythema with telangiectasia, particularly on left cheek. Two inflamed papules, non tender, non bleeding right cheek.  Cardiovascular:     Rate and Rhythm: Regular rhythm.     Heart sounds: Normal heart sounds.  Pulmonary:     Effort: Pulmonary effort is normal. No respiratory distress.     Breath sounds: Normal breath sounds. No wheezing, rhonchi or rales.  Skin:    General: Skin is warm and dry.  Neurological:     Mental Status: He is alert.  Psychiatric:        Speech: Speech normal.        Behavior: Behavior normal.

## 2023-01-26 NOTE — Patient Instructions (Signed)
Increase Ozempic to 2 mg   trial of Bactroban.  I have also placed a referral to dermatology. Let us know if you dont hear back within a week in regards to an appointment being scheduled.   So that you are aware, if you are [facility] MyChart user , please pay attention to your MyChart messages as you may receive a MyChart message with a phone number to call and schedule this test/appointment own your own from our referral coordinator. This is a new process so I do not want you to miss this message.  If you are not a MyChart user, you will receive a phone call.

## 2023-03-22 ENCOUNTER — Encounter (INDEPENDENT_AMBULATORY_CARE_PROVIDER_SITE_OTHER): Payer: Self-pay

## 2023-04-30 ENCOUNTER — Ambulatory Visit: Payer: 59 | Admitting: Family

## 2023-04-30 ENCOUNTER — Encounter: Payer: Self-pay | Admitting: Family

## 2023-04-30 VITALS — BP 124/78 | HR 99 | Temp 98.5°F | Ht 68.0 in | Wt 197.0 lb

## 2023-04-30 DIAGNOSIS — R7309 Other abnormal glucose: Secondary | ICD-10-CM

## 2023-04-30 DIAGNOSIS — Z7985 Long-term (current) use of injectable non-insulin antidiabetic drugs: Secondary | ICD-10-CM | POA: Diagnosis not present

## 2023-04-30 DIAGNOSIS — E785 Hyperlipidemia, unspecified: Secondary | ICD-10-CM | POA: Diagnosis not present

## 2023-04-30 DIAGNOSIS — E119 Type 2 diabetes mellitus without complications: Secondary | ICD-10-CM

## 2023-04-30 DIAGNOSIS — Z7984 Long term (current) use of oral hypoglycemic drugs: Secondary | ICD-10-CM

## 2023-04-30 DIAGNOSIS — E1165 Type 2 diabetes mellitus with hyperglycemia: Secondary | ICD-10-CM

## 2023-04-30 LAB — POCT GLYCOSYLATED HEMOGLOBIN (HGB A1C): Hemoglobin A1C: 7.7 % — AB (ref 4.0–5.6)

## 2023-04-30 MED ORDER — LOSARTAN POTASSIUM 25 MG PO TABS: ORAL_TABLET | ORAL | 1 refills | Status: DC

## 2023-04-30 MED ORDER — SEMAGLUTIDE (2 MG/DOSE) 8 MG/3ML ~~LOC~~ SOPN: 2.0000 mg | PEN_INJECTOR | SUBCUTANEOUS | 3 refills | Status: DC

## 2023-04-30 MED ORDER — ROSUVASTATIN CALCIUM 10 MG PO TABS: 10.0000 mg | ORAL_TABLET | Freq: Every day | ORAL | 3 refills | Status: DC

## 2023-04-30 MED ORDER — METFORMIN HCL 1000 MG PO TABS: ORAL_TABLET | ORAL | 3 refills | Status: DC

## 2023-04-30 MED ORDER — BLOOD GLUCOSE METER KIT: PACK | 0 refills | Status: AC

## 2023-04-30 NOTE — Assessment & Plan Note (Addendum)
Lab Results  Component Value Date   HGBA1C 7.7 (A) 04/30/2023   Improved however still not at goal, 6.5.  Patient and I discussed whether to add Jardiance or pursue lifestyle modifications.  Patient would like to get Ozempic 2 mg medication more time and improve diet.  Continue Ozempic 2 mg , metformin to 2000 mg QD

## 2023-04-30 NOTE — Patient Instructions (Signed)
Nice to see you!

## 2023-04-30 NOTE — Assessment & Plan Note (Signed)
Lab Results  Component Value Date   LDLCALC 36 01/26/2023   Excellent control, continue Crestor 10 mg

## 2023-04-30 NOTE — Progress Notes (Signed)
Assessment & Plan:  Type 2 diabetes mellitus with hyperglycemia, without long-term current use of insulin (HCC) -     blood glucose meter kit and supplies; Dispense based on patient and insurance preference. Use up to four times daily as directed. (FOR ICD-10 E10.9, E11.9).  Dispense: 1 each; Refill: 0 -     metFORMIN HCl; TAKE 1 TABLET BY MOUTH 2XS DAILY WITH MEAL  Dispense: 180 tablet; Refill: 3 -     Semaglutide (2 MG/DOSE); Inject 2 mg as directed once a week.  Dispense: 3 mL; Refill: 3  Elevated glucose -     POCT glycosylated hemoglobin (Hb A1C)  Hyperlipidemia, unspecified hyperlipidemia type Assessment & Plan: Lab Results  Component Value Date   LDLCALC 36 01/26/2023   Excellent control, continue Crestor 10 mg  Orders: -     Rosuvastatin Calcium; Take 1 tablet (10 mg total) by mouth daily.  Dispense: 90 tablet; Refill: 3  Diabetes mellitus without complication (HCC) Assessment & Plan: Lab Results  Component Value Date   HGBA1C 7.7 (A) 04/30/2023   Improved however still not at goal, 6.5.  Patient and I discussed whether to add Jardiance or pursue lifestyle modifications.  Patient would like to get Ozempic 2 mg medication more time and improve diet.  Continue Ozempic 2 mg , metformin to 2000 mg QD   Other orders -     Losartan Potassium; TAKE 1 TABLET(25 MG) BY MOUTH DAILY  Dispense: 90 tablet; Refill: 1     Return precautions given.   Risks, benefits, and alternatives of the medications and treatment plan prescribed today were discussed, and patient expressed understanding.   Education regarding symptom management and diagnosis given to patient on AVS either electronically or printed.  Return in about 3 months (around 07/30/2023).  [name], FNP  Subjective:    Patient ID: Andrew Holloway., male    DOB: [birth year], 52 y.o.   MRN: [id number]  CC: Andrew Elzinga Swendsen Montez Hageman. is a 52 y.o. male who presents today for follow up.   HPI: Feels well today.  No  new complaints.  Tolerating Ozempic 2 mg weekly.  Denies constipation, nausea    Allergies: Patient has no known allergies. Current Outpatient Medications on File Prior to Visit  Medication Sig Dispense Refill   APPLE CIDER VINEGAR PO Take 15 mLs by mouth 3 (three) times a week.     Aspirin-Acetaminophen-Caffeine (GOODYS EXTRA STRENGTH) 500-325-65 MG PACK Take 1 packet by mouth daily as needed (pain.).     glucose blood (ACCU-CHEK GUIDE) test strip Use to check blood sugar twice daily 200 each 3   ibuprofen (ADVIL) 200 MG tablet Take 200 mg by mouth daily as needed (pain.).     MAGNESIUM PO Take 1 tablet by mouth every evening.     Multiple Vitamins-Minerals (ZINC PO) Take 1 tablet by mouth daily in the afternoon.     mupirocin ointment (BACTROBAN) 2 % Apply 1 Application topically 2 (two) times daily. 22 g 2   NON FORMULARY Take 1 capsule by mouth daily. Lion's Mane supplement     Omega-3 Fatty Acids (FISH OIL PO) Take 1 capsule by mouth in the morning.     OVER THE COUNTER MEDICATION Take 1 tablet by mouth daily. Shilajit Extract     No current facility-administered medications on file prior to visit.    Review of Systems  Constitutional:  Negative for chills and fever.  Respiratory:  Negative for cough.   Cardiovascular:  Negative for chest pain and palpitations.  Gastrointestinal:  Negative for nausea and vomiting.      Objective:    BP 124/78   Pulse 99   Temp 98.5 F (36.9 C) (Oral)   Ht 5\' 8"  (1.727 m)   Wt 197 lb (89.4 kg)   SpO2 98%   BMI 29.95 kg/m  BP Readings from Last 3 Encounters:  04/30/23 124/78  01/26/23 120/76  10/04/22 130/70   Wt Readings from Last 3 Encounters:  04/30/23 197 lb (89.4 kg)  01/26/23 195 lb 9.6 oz (88.7 kg)  10/04/22 197 lb 12.8 oz (89.7 kg)    Physical Exam Vitals reviewed.  Constitutional:      Appearance: He is well-developed.  Cardiovascular:     Rate and Rhythm: Regular rhythm.     Heart sounds: Normal heart sounds.   Pulmonary:     Effort: Pulmonary effort is normal. No respiratory distress.     Breath sounds: Normal breath sounds. No wheezing, rhonchi or rales.  Skin:    General: Skin is warm and dry.  Neurological:     Mental Status: He is alert.  Psychiatric:        Speech: Speech normal.        Behavior: Behavior normal.

## 2023-08-13 ENCOUNTER — Encounter: Payer: Self-pay | Admitting: Family

## 2023-08-14 ENCOUNTER — Ambulatory Visit: Payer: 59 | Admitting: Family

## 2023-09-03 ENCOUNTER — Ambulatory Visit: Payer: Self-pay | Admitting: Family

## 2023-09-03 ENCOUNTER — Ambulatory Visit (INDEPENDENT_AMBULATORY_CARE_PROVIDER_SITE_OTHER): Payer: 59 | Admitting: Family

## 2023-09-03 VITALS — BP 116/74 | HR 123 | Temp 101.1°F | Ht 68.0 in | Wt 198.2 lb

## 2023-09-03 DIAGNOSIS — J101 Influenza due to other identified influenza virus with other respiratory manifestations: Secondary | ICD-10-CM | POA: Diagnosis not present

## 2023-09-03 DIAGNOSIS — R000 Tachycardia, unspecified: Secondary | ICD-10-CM | POA: Diagnosis not present

## 2023-09-03 DIAGNOSIS — Z20822 Contact with and (suspected) exposure to covid-19: Secondary | ICD-10-CM | POA: Diagnosis not present

## 2023-09-03 DIAGNOSIS — R509 Fever, unspecified: Secondary | ICD-10-CM

## 2023-09-03 LAB — POCT INFLUENZA A/B
Influenza A, POC: POSITIVE — AB
Influenza B, POC: NEGATIVE

## 2023-09-03 LAB — POC COVID19 BINAXNOW: SARS Coronavirus 2 Ag: NEGATIVE

## 2023-09-03 LAB — POCT RAPID STREP A (OFFICE): Rapid Strep A Screen: NEGATIVE

## 2023-09-03 MED ORDER — OSELTAMIVIR PHOSPHATE 75 MG PO CAPS: 75.0000 mg | ORAL_CAPSULE | Freq: Two times a day (BID) | ORAL | 0 refills | Status: AC

## 2023-09-03 NOTE — Progress Notes (Signed)
Established Patient Office Visit  Subjective:   Patient ID: Andrew Holloway., male    DOB: [birth year]  Age: 53 y.o. MRN: [id number]  CC:  Chief Complaint  Patient presents with   Acute Visit    HPI: Gatsby Chismar. is a 53 y.o. male presenting on 09/03/2023 for Acute Visit  Started with sx yesterday with headache, body aches, fever, and cough.  Grand daughter recently stayed with him and she was positive for flu A        ROS: Negative unless specifically indicated above in HPI.   Relevant past medical history reviewed and updated as indicated.   Allergies and medications reviewed and updated.   Current Outpatient Medications:    oseltamivir (TAMIFLU) 75 MG capsule, Take 1 capsule (75 mg total) by mouth 2 (two) times daily for 5 days., Disp: 10 capsule, Rfl: 0   APPLE CIDER VINEGAR PO, Take 15 mLs by mouth 3 (three) times a week., Disp: , Rfl:    Aspirin-Acetaminophen-Caffeine (GOODYS EXTRA STRENGTH) 500-325-65 MG PACK, Take 1 packet by mouth daily as needed (pain.)., Disp: , Rfl:    blood glucose meter kit and supplies, Dispense based on patient and insurance preference. Use up to four times daily as directed. (FOR ICD-10 E10.9, E11.9)., Disp: 1 each, Rfl: 0   glucose blood (ACCU-CHEK GUIDE) test strip, Use to check blood sugar twice daily, Disp: 200 each, Rfl: 3   ibuprofen (ADVIL) 200 MG tablet, Take 200 mg by mouth daily as needed (pain.)., Disp: , Rfl:    losartan (COZAAR) 25 MG tablet, TAKE 1 TABLET(25 MG) BY MOUTH DAILY, Disp: 90 tablet, Rfl: 1   MAGNESIUM PO, Take 1 tablet by mouth every evening., Disp: , Rfl:    metFORMIN (GLUCOPHAGE) 1000 MG tablet, TAKE 1 TABLET BY MOUTH 2XS DAILY WITH MEAL, Disp: 180 tablet, Rfl: 3   Multiple Vitamins-Minerals (ZINC PO), Take 1 tablet by mouth daily in the afternoon., Disp: , Rfl:    mupirocin ointment (BACTROBAN) 2 %, Apply 1 Application topically 2 (two) times daily., Disp: 22 g, Rfl: 2   NON FORMULARY, Take 1 capsule  by mouth daily. Lion's Mane supplement, Disp: , Rfl:    Omega-3 Fatty Acids (FISH OIL PO), Take 1 capsule by mouth in the morning., Disp: , Rfl:    OVER THE COUNTER MEDICATION, Take 1 tablet by mouth daily. Shilajit Extract, Disp: , Rfl:    rosuvastatin (CRESTOR) 10 MG tablet, Take 1 tablet (10 mg total) by mouth daily., Disp: 90 tablet, Rfl: 3   Semaglutide, 2 MG/DOSE, 8 MG/3ML SOPN, Inject 2 mg as directed once a week., Disp: 3 mL, Rfl: 3  No Known Allergies  Objective:   BP 116/74   Pulse (!) 123   Temp (!) 101.1 F (38.4 C) (Oral)   Ht 5\' 8"  (1.727 m)   Wt 198 lb 3.2 oz (89.9 kg)   SpO2 98%   BMI 30.14 kg/m   Manual heart rate 120 bpm   Physical Exam Vitals reviewed.  Constitutional:      General: He is not in acute distress.    Appearance: Normal appearance. He is obese. He is not ill-appearing, toxic-appearing or diaphoretic.  HENT:     Head: Normocephalic.     Right Ear: Tympanic membrane normal.     Left Ear: Tympanic membrane normal.     Nose: Nose normal.     Mouth/Throat:     Mouth: Mucous membranes are moist.  Eyes:  Pupils: Pupils are equal, round, and reactive to light.  Cardiovascular:     Rate and Rhythm: Regular rhythm. Tachycardia present.  Pulmonary:     Effort: Pulmonary effort is normal.     Breath sounds: Normal breath sounds. No wheezing.  Musculoskeletal:        General: Normal range of motion.     Cervical back: Normal range of motion.  Neurological:     General: No focal deficit present.     Mental Status: He is alert and oriented to person, place, and time. Mental status is at baseline.  Psychiatric:        Mood and Affect: Mood normal.        Behavior: Behavior normal.        Thought Content: Thought content normal.        Judgment: Judgment normal.     Assessment & Plan:  Suspected COVID-19 virus infection -     POC COVID-19 BinaxNow  Fever, unspecified fever cause -     POCT rapid strep A -     POC COVID-19 BinaxNow -      POCT Influenza A/B  Influenza A Assessment & Plan: Tamiflu 75 mg bid x 5 days  Advised patient on supportive measures:  Be sure to rest, drink plenty of fluids, and use tylenol or ibuprofen as needed for pain and fever. Follow up if symptoms worsen or if symptoms are not improved in 3 days. Patient verbalizes understanding.    Orders: -     Oseltamivir Phosphate; Take 1 capsule (75 mg total) by mouth 2 (two) times daily for 5 days.  Dispense: 10 capsule; Refill: 0  Increased heart rate Assessment & Plan: Elevated on physical exam however pt with fever and also likely dehydrated Advised to take tylenol ibuprofen to bring down fever and focus on hydration with water.   Discussed if any cp, fluttering of heart, and or sob suddenly go to ER.  Pt states often with high heart rate, advised pt once feeling better start tracking and if constant elevated > 100 f/u with PCP also suspect today is secondary      Follow up plan: Return for f/u PCP if no improvement in symptoms.  Mort Sawyers, FNP

## 2023-09-03 NOTE — Telephone Encounter (Signed)
Copied from CRM [id number]. Topic: Clinical - Red Word Triage >> Sep 03, 2023 11:22 AM Claudine Mouton wrote: [facility] that prompted transfer to Nurse Triage:  FEVER. 101.5 and 101.7.   Fever and body aches. Started last night. Granddaughter had the flu last week.  Chief Complaint: Cold/flu symptoms Symptoms: Fever, cough, chills, diarrhea, body aches Frequency: Last night Pertinent Negatives: Patient denies SOB and chest pain Disposition: [] ED /[] Urgent Care (no appt availability in office) / [x] Appointment(In office/virtual)/ []  [facility]/ [] Home Care/ [] Refused Recommended Disposition /[] [place]/ []  Follow-up with PCP Additional Notes: Patient called in to report a variety of cold/flu symptoms. Patient stated he is experiencing fever, cough, chills, diarrhea, and body aches. Patient stated symptoms started last night. Patient's most recent temperature was 100.7, taken on the phone while talking to this RN. Patient has been taking Tylenol. Patient stated his granddaughter had the flu last week. Patient has type II diabetes. Patient denied SOB and chest pain. Advised patient to see a provider within 4 hours. No availability at patient's current PCP office. Scheduled patient a same day appointment and [facility]. Advised patient to call back if symptoms worsen. Patient complied.   Reason for Disposition  [1] Fever > 100.0 F (37.8 C) AND [2] diabetes mellitus or weak immune system (e.g., HIV positive, cancer chemo, splenectomy, organ transplant, chronic steroids)  Answer Assessment - Initial Assessment Questions 1. TEMPERATURE: "What is the most recent temperature?"  "How was it measured?"      100.7 taken temporally, on the phone with this RN 2. ONSET: "When did the fever start?"      Last night  3. CHILLS: "Do you have chills?" If yes: "How bad are they?"  (e.g., none, mild, moderate, severe)   - NONE: no chills   - MILD: feeling cold   - MODERATE: feeling very cold,  some shivering (feels better under a thick blanket)   - SEVERE: feeling extremely cold with shaking chills (general body shaking, rigors; even under a thick blanket)      Moderate- staying in the bed under the covers  4. OTHER SYMPTOMS: "Do you have any other symptoms besides the fever?"  (e.g., abdomen pain, cough, diarrhea, earache, headache, sore throat, urination pain)     Cough, body aches, diarrhea, denies chest pain, denies SOB  6. CONTACTS: "Does anyone else in the family have an infection?"     Granddaughter had the flu last week  7. TREATMENT: "What have you done so far to treat this fever?" (e.g., medications)     Tylenol  8. IMMUNOCOMPROMISE: "Do you have of the following: diabetes, HIV positive, splenectomy, cancer chemotherapy, chronic steroid treatment, transplant patient, etc."     Diabetes II  Protocols used: Fever-A-AH

## 2023-09-03 NOTE — Patient Instructions (Signed)
  Ibuprofen every 6-8 hours  Tylenol every 4-6 hours not to exceed 4 g (4000 mg) daily.

## 2023-09-03 NOTE — Assessment & Plan Note (Signed)
Elevated on physical exam however pt with fever and also likely dehydrated Advised to take tylenol ibuprofen to bring down fever and focus on hydration with water.   Discussed if any cp, fluttering of heart, and or sob suddenly go to ER.  Pt states often with high heart rate, advised pt once feeling better start tracking and if constant elevated > 100 f/u with PCP also suspect today is secondary

## 2023-09-03 NOTE — Assessment & Plan Note (Signed)
Tamiflu 75 mg bid x 5 days  Advised patient on supportive measures:  Be sure to rest, drink plenty of fluids, and use tylenol or ibuprofen as needed for pain and fever. Follow up if symptoms worsen or if symptoms are not improved in 3 days. Patient verbalizes understanding.

## 2023-09-04 ENCOUNTER — Encounter: Payer: Self-pay | Admitting: Family

## 2023-09-04 ENCOUNTER — Other Ambulatory Visit: Payer: Self-pay

## 2023-09-04 DIAGNOSIS — E1165 Type 2 diabetes mellitus with hyperglycemia: Secondary | ICD-10-CM

## 2023-09-04 DIAGNOSIS — E785 Hyperlipidemia, unspecified: Secondary | ICD-10-CM

## 2023-09-04 MED ORDER — SEMAGLUTIDE (2 MG/DOSE) 8 MG/3ML ~~LOC~~ SOPN: 2.0000 mg | PEN_INJECTOR | SUBCUTANEOUS | 3 refills | Status: DC

## 2023-09-04 MED ORDER — METFORMIN HCL 1000 MG PO TABS: ORAL_TABLET | ORAL | 3 refills | Status: DC

## 2023-09-04 MED ORDER — ROSUVASTATIN CALCIUM 10 MG PO TABS: 10.0000 mg | ORAL_TABLET | Freq: Every day | ORAL | 3 refills | Status: DC

## 2023-09-04 MED ORDER — LOSARTAN POTASSIUM 25 MG PO TABS: ORAL_TABLET | ORAL | 1 refills | Status: DC

## 2023-09-04 NOTE — Telephone Encounter (Signed)
Seen by tabitha dugal 09/03/23

## 2023-09-05 ENCOUNTER — Ambulatory Visit: Payer: 59 | Admitting: Family

## 2023-09-06 NOTE — Progress Notes (Signed)
 Assessment & Plan:  Diabetes mellitus without complication [facility]) Assessment & Plan: Lab Results  Component Value Date   HGBA1C 6.9 (A) 09/17/2023   Improved .  Congratulated patient on diligence to diet.  continue Ozempic  2 mg , metformin  to 2000 mg QD  Orders: -     Microalbumin / creatinine urine ratio -     Comprehensive metabolic panel  Elevated glucose -     POCT glycosylated hemoglobin (Hb A1C)  Screening for prostate cancer -     PSA     Return precautions given.   Risks, benefits, and alternatives of the medications and treatment plan prescribed today were discussed, and patient expressed understanding.   Education regarding symptom management and diagnosis given to patient on AVS either electronically or printed.  No follow-ups on file.  Bascom Bossier, FNP  Subjective:    Patient ID: Andrew Holloway., male    DOB: 1971-08-01, 53 y.o.   MRN: [id number]  CC: Robet Martenson Cuadras Marieta Shorten54 is a 53 y.o. male who presents today for follow up.   HPI: Feels well today Denies  palpitations, CP  Flu symptoms resolved  Following low carb diet and pleased with weight loss  FBG 85, 110 , 135   Elevated heart rate in setting of viral URI, 09/03/23 HR 123 Treated with tamiflu     Denies decreased urine stream, urinary hesitancy  Allergies: Patient has no known allergies. Current Outpatient Medications on File Prior to Visit  Medication Sig Dispense Refill   APPLE CIDER VINEGAR PO Take 15 mLs by mouth 3 (three) times a week.     Aspirin-Acetaminophen -Caffeine (GOODYS EXTRA STRENGTH) 500-325-65 MG PACK Take 1 packet by mouth daily as needed (pain.).     blood glucose meter kit and supplies Dispense based on patient and insurance preference. Use up to four times daily as directed. (FOR ICD-10 E10.9, E11.9). 1 each 0   glucose blood (ACCU-CHEK GUIDE) test strip Use to check blood sugar twice daily 200 each 3   ibuprofen (ADVIL) 200 MG tablet Take 200 mg by mouth  daily as needed (pain.).     losartan  (COZAAR ) 25 MG tablet TAKE 1 TABLET(25 MG) BY MOUTH DAILY 90 tablet 1   MAGNESIUM PO Take 1 tablet by mouth every evening.     metFORMIN  (GLUCOPHAGE ) 1000 MG tablet TAKE 1 TABLET BY MOUTH 2XS DAILY WITH MEAL 180 tablet 3   Multiple Vitamins-Minerals (ZINC PO) Take 1 tablet by mouth daily in the afternoon.     mupirocin  ointment (BACTROBAN ) 2 % Apply 1 Application topically 2 (two) times daily. 22 g 2   NON FORMULARY Take 1 capsule by mouth daily. Lion's Mane supplement     Omega-3 Fatty Acids (FISH OIL PO) Take 1 capsule by mouth in the morning.     OVER THE COUNTER MEDICATION Take 1 tablet by mouth daily. Shilajit Extract     rosuvastatin  (CRESTOR ) 10 MG tablet Take 1 tablet (10 mg total) by mouth daily. 90 tablet 3   Semaglutide , 2 MG/DOSE, 8 MG/3ML SOPN Inject 2 mg as directed once a week. 3 mL 3   No current facility-administered medications on file prior to visit.    Review of Systems  Constitutional:  Negative for chills and fever.  Respiratory:  Negative for cough.   Cardiovascular:  Negative for chest pain and palpitations.  Gastrointestinal:  Negative for nausea and vomiting.      Objective:    BP 118/76   Pulse 99  Temp 98.1 F (36.7 C) (Oral)   Ht 5\' 8"  (1.727 m)   Wt 192 lb (87.1 kg)   SpO2 98%   BMI 29.19 kg/m  BP Readings from Last 3 Encounters:  09/17/23 118/76  09/03/23 116/74  04/30/23 124/78   Wt Readings from Last 3 Encounters:  09/17/23 192 lb (87.1 kg)  09/03/23 198 lb 3.2 oz (89.9 kg)  04/30/23 197 lb (89.4 kg)    Physical Exam Vitals reviewed.  Constitutional:      Appearance: He is well-developed.  Cardiovascular:     Rate and Rhythm: Regular rhythm.     Heart sounds: Normal heart sounds.  Pulmonary:     Effort: Pulmonary effort is normal. No respiratory distress.     Breath sounds: Normal breath sounds. No wheezing, rhonchi or rales.  Skin:    General: Skin is warm and dry.  Neurological:      Mental Status: He is alert.  Psychiatric:        Speech: Speech normal.        Behavior: Behavior normal.

## 2023-09-07 ENCOUNTER — Other Ambulatory Visit (HOSPITAL_COMMUNITY): Payer: Self-pay

## 2023-09-07 ENCOUNTER — Telehealth: Payer: Self-pay

## 2023-09-07 NOTE — Telephone Encounter (Signed)
Pharmacy Patient Advocate Encounter   Received notification from CoverMyMeds that prior authorization for Ozempic (0.25 or 0.5 MG/DOSE) 2MG /3ML pen-injectors is required/requested.   Insurance verification completed.   The patient is insured through  [facility]  .   Per test claim: Refill too soon. PA is not needed at this time. Please see below as multiple future fill dates were given

## 2023-09-17 ENCOUNTER — Encounter: Payer: Self-pay | Admitting: Family

## 2023-09-17 ENCOUNTER — Ambulatory Visit: Payer: 59 | Admitting: Family

## 2023-09-17 VITALS — BP 118/76 | HR 99 | Temp 98.1°F | Ht 68.0 in | Wt 192.0 lb

## 2023-09-17 DIAGNOSIS — Z125 Encounter for screening for malignant neoplasm of prostate: Secondary | ICD-10-CM

## 2023-09-17 DIAGNOSIS — Z7984 Long term (current) use of oral hypoglycemic drugs: Secondary | ICD-10-CM

## 2023-09-17 DIAGNOSIS — E119 Type 2 diabetes mellitus without complications: Secondary | ICD-10-CM

## 2023-09-17 DIAGNOSIS — Z7985 Long-term (current) use of injectable non-insulin antidiabetic drugs: Secondary | ICD-10-CM

## 2023-09-17 DIAGNOSIS — R7309 Other abnormal glucose: Secondary | ICD-10-CM

## 2023-09-17 LAB — COMPREHENSIVE METABOLIC PANEL
ALT: 23 U/L (ref 0–53)
AST: 19 U/L (ref 0–37)
Albumin: 4.5 g/dL (ref 3.5–5.2)
Alkaline Phosphatase: 43 U/L (ref 39–117)
BUN: 11 mg/dL (ref 6–23)
CO2: 29 meq/L (ref 19–32)
Calcium: 9.4 mg/dL (ref 8.4–10.5)
Chloride: 103 meq/L (ref 96–112)
Creatinine, Ser: 0.9 mg/dL (ref 0.40–1.50)
GFR: 97.33 mL/min (ref >60.00)
Glucose, Bld: 145 mg/dL — ABNORMAL HIGH (ref 70–99)
Potassium: 4.3 meq/L (ref 3.5–5.1)
Sodium: 141 meq/L (ref 135–145)
Total Bilirubin: 1.4 mg/dL — ABNORMAL HIGH (ref 0.2–1.2)
Total Protein: 7.4 g/dL (ref 6.0–8.3)

## 2023-09-17 LAB — POCT GLYCOSYLATED HEMOGLOBIN (HGB A1C): Hemoglobin A1C: 6.9 % — AB (ref 4.0–5.6)

## 2023-09-17 NOTE — Assessment & Plan Note (Signed)
 Lab Results  Component Value Date   HGBA1C 6.9 (A) 09/17/2023   Improved .  Congratulated patient on diligence to diet.  continue Ozempic  2 mg , metformin  to 2000 mg QD

## 2023-09-18 ENCOUNTER — Other Ambulatory Visit: Payer: Self-pay | Admitting: Family

## 2023-09-18 ENCOUNTER — Other Ambulatory Visit (HOSPITAL_COMMUNITY): Payer: Self-pay

## 2023-09-18 ENCOUNTER — Encounter: Payer: Self-pay | Admitting: Family

## 2023-09-18 DIAGNOSIS — R899 Unspecified abnormal finding in specimens from other organs, systems and tissues: Secondary | ICD-10-CM

## 2023-09-18 LAB — MICROALBUMIN / CREATININE URINE RATIO
Creatinine,U: 153.3 mg/dL
Microalb Creat Ratio: 8.3 mg/g (ref 0.0–30.0)
Microalb, Ur: 1.3 mg/dL (ref 0.0–1.9)

## 2023-09-18 LAB — PSA: PSA: 5.2 ng/mL — ABNORMAL HIGH (ref <=4.00)

## 2023-09-28 ENCOUNTER — Other Ambulatory Visit: Payer: 59

## 2023-09-28 DIAGNOSIS — R899 Unspecified abnormal finding in specimens from other organs, systems and tissues: Secondary | ICD-10-CM

## 2023-09-28 LAB — URINALYSIS, ROUTINE W REFLEX MICROSCOPIC
Bilirubin Urine: NEGATIVE
Hgb urine dipstick: NEGATIVE
Leukocytes,Ua: NEGATIVE
Nitrite: NEGATIVE
RBC / HPF: NONE SEEN (ref 0–?)
Specific Gravity, Urine: 1.025 (ref 1.000–1.030)
Urine Glucose: NEGATIVE
Urobilinogen, UA: 0.2 (ref 0.0–1.0)
pH: 6 (ref 5.0–8.0)

## 2023-09-28 LAB — HEPATIC FUNCTION PANEL
ALT: 19 U/L (ref 0–53)
AST: 16 U/L (ref 0–37)
Albumin: 4.3 g/dL (ref 3.5–5.2)
Alkaline Phosphatase: 39 U/L (ref 39–117)
Bilirubin, Direct: 0.4 mg/dL — ABNORMAL HIGH (ref 0.0–0.3)
Total Bilirubin: 2.9 mg/dL — ABNORMAL HIGH (ref 0.2–1.2)
Total Protein: 7.2 g/dL (ref 6.0–8.3)

## 2023-09-28 LAB — PSA: PSA: 4.49 ng/mL — ABNORMAL HIGH (ref 0.10–4.00)

## 2023-09-28 NOTE — Addendum Note (Signed)
 Addended by: Jarvis Morgan D on: 09/28/2023 07:57 AM   Modules accepted: Orders

## 2023-10-01 ENCOUNTER — Telehealth: Payer: Self-pay | Admitting: Family

## 2023-10-01 ENCOUNTER — Other Ambulatory Visit: Payer: Self-pay | Admitting: Family

## 2023-10-01 ENCOUNTER — Encounter: Payer: Self-pay | Admitting: Family

## 2023-10-01 DIAGNOSIS — R899 Unspecified abnormal finding in specimens from other organs, systems and tissues: Secondary | ICD-10-CM

## 2023-10-01 NOTE — Telephone Encounter (Signed)
 Lft pt vm to call ofc to sch [place]. thanks ?

## 2023-10-02 ENCOUNTER — Telehealth: Payer: Self-pay | Admitting: Family

## 2023-10-02 NOTE — Telephone Encounter (Signed)
 Lft pt vm to call ofc to sch [place]. thanks ?

## 2023-10-03 ENCOUNTER — Telehealth: Payer: Self-pay | Admitting: Family

## 2023-10-03 NOTE — Telephone Encounter (Signed)
 Lft pt vm to call ofc to sch [place]. thanks ?

## 2023-10-16 ENCOUNTER — Ambulatory Visit (INDEPENDENT_AMBULATORY_CARE_PROVIDER_SITE_OTHER): Admitting: Urology

## 2023-10-16 VITALS — BP 122/85 | HR 101 | Ht 68.0 in | Wt 191.0 lb

## 2023-10-16 DIAGNOSIS — R972 Elevated prostate specific antigen [PSA]: Secondary | ICD-10-CM | POA: Diagnosis not present

## 2023-10-16 NOTE — Progress Notes (Signed)
 I,Amy L Pierron,acting as a scribe for Vanna Scotland, MD.,have documented all relevant documentation on the behalf of Vanna Scotland, MD,as directed by  Vanna Scotland, MD while in the presence of Vanna Scotland, MD.  10/16/2023 10:42 AM   Andrew Fells Jr. [birth year] [id number]  Referring provider: Allegra Grana, FNP [address] [place],  [place] [address]  Chief Complaint  Patient presents with   Establish Care   Elevated PSA    HPI: 53 year-old male referred for further evaluation of elevated PSA.  He had a markedly elevated PSA to 5.2 on 09/17/2023. This was repeated about two weeks later and was trending back downwards to 4.49; his previous baseline was in the 2 range. He did have a urinalysis at the time which was negative for any signs of infection of blood.  He denies any urinary symptoms such as urgency, frequency, or burning, and reports no family history of prostate, breast, or ovarian cancer.    PMH: Past Medical History:  Diagnosis Date   Chicken pox    Diabetes mellitus, type 2 (HCC)    History of kidney stones    Motion sickness    back seat of car   Umbilical hernia 10/17/2011   Wears contact lenses     Surgical History: Past Surgical History:  Procedure Laterality Date   ANKLE SURGERY     x2   COLONOSCOPY WITH PROPOFOL N/A 05/03/2020   Procedure: COLONOSCOPY WITH PROPOFOL;  Surgeon: Midge Minium, MD;  Location: [facility];  Service: Endoscopy;  Laterality: N/A;  Diabetic - oral meds priority 4   EPIGASTRIC HERNIA REPAIR N/A 03/30/2015   Procedure: HERNIA REPAIR EPIGASTRIC ADULT;  Surgeon: Earline Mayotte, MD;  Location: [facility];  Service: General;  Laterality: N/A;   HERNIA REPAIR  2014   umbilical hernia   HERNIA REPAIR  03/30/2015   Epigastric and umbilical hernia with retrorectus 6.4 cm Ventralex mesh   left and right ankle repair ligament     nasal spectrum     rotator cuff right     SHOULDER SURGERY     rotator  cuff    Home Medications:  Allergies as of 10/16/2023   No Known Allergies      Medication List        Accurate as of October 16, 2023 10:42 AM. If you have any questions, ask your nurse or doctor.          Accu-Chek Guide test strip Generic drug: glucose blood Use to check blood sugar twice daily   APPLE CIDER VINEGAR PO Take 15 mLs by mouth 3 (three) times a week.   blood glucose meter kit and supplies Dispense based on patient and insurance preference. Use up to four times daily as directed. (FOR ICD-10 E10.9, E11.9).   FISH OIL PO Take 1 capsule by mouth in the morning.   Goodys Extra Strength [id number] MG Pack Generic drug: Aspirin-Acetaminophen-Caffeine Take 1 packet by mouth daily as needed (pain.).   ibuprofen 200 MG tablet Commonly known as: ADVIL Take 200 mg by mouth daily as needed (pain.).   losartan 25 MG tablet Commonly known as: COZAAR TAKE 1 TABLET(25 MG) BY MOUTH DAILY   MAGNESIUM PO Take 1 tablet by mouth every evening.   metFORMIN 1000 MG tablet Commonly known as: GLUCOPHAGE TAKE 1 TABLET BY MOUTH 2XS DAILY WITH MEAL   mupirocin ointment 2 % Commonly known as: BACTROBAN Apply 1 Application topically 2 (two) times daily.  NON FORMULARY Take 1 capsule by mouth daily. Lion's Mane supplement   OVER THE COUNTER MEDICATION Take 1 tablet by mouth daily. Shilajit Extract   rosuvastatin 10 MG tablet Commonly known as: CRESTOR Take 1 tablet (10 mg total) by mouth daily.   Semaglutide (2 MG/DOSE) 8 MG/3ML Sopn Inject 2 mg as directed once a week.   ZINC PO Take 1 tablet by mouth daily in the afternoon.        Family History: Family History  Problem Relation Age of Onset   Hypertension Mother    Hyperlipidemia Father    Leukemia Father 53       s/p transplant x 2, died from PML   Diabetes Maternal Grandmother    Heart disease Maternal Grandfather    Hyperlipidemia Maternal Grandfather    Heart disease Paternal Grandfather     Hyperlipidemia Paternal Grandfather    Thyroid cancer Neg Hx     Social History:  reports that he has never smoked. He has never used smokeless tobacco. He reports current alcohol use. He reports that he does not use drugs.   Physical Exam: BP 122/85   Pulse (!) 101   Ht 5\' 8"  (1.727 m)   Wt 191 lb (86.6 kg)   BMI 29.04 kg/m   Constitutional:  Alert and oriented, No acute distress. HEENT: [place] AT, moist mucus membranes.  Trachea midline, no masses. GU: Prostate slightly enlarged, heart shaped. Induration at right base without nodularity. Normal sphincter tone.  Neurologic: Grossly intact, no focal deficits, moving all 4 extremities. Psychiatric: Normal mood and affect.   Assessment & Plan:    1. Elevated PSA  -  We reviewed the implications of an elevated PSA and the uncertainty surrounding it. In general, a man's PSA increases with age and is produced by both normal and cancerous prostate tissue. Differential for elevated PSA is BPH, prostate cancer, infection, recent intercourse/ejaculation, prostate infarction, recent urethroscopic manipulation (foley placement/cystoscopy) and prostatitis. Management of an elevated PSA can include observation or prostate biopsy and wediscussed this in detail.  - We discussed that indications for prostate biopsy are defined by age and race specific PSA cutoffs as well as a PSA velocity of 0.75/year.  - The rapid rise and subsequent decrease suggest possible inflammation rather than malignancy.  - Plan to repeat the PSA test today. If the PSA returns to baseline, will continue annual monitoring with his primary care provider. If the PSA remains elevated, further discussion regarding the need for a biopsy will be necessary.  - We discussed prostate biopsy in detail including the procedure itself, the risks of blood in the urine, stool, and ejaculate, serious infection, and discomfort. Information given to read at home.  No follow-ups on  file.   [facility] [address], [address] [place], [place] [address] [phone]

## 2023-10-16 NOTE — Patient Instructions (Addendum)

## 2023-10-17 ENCOUNTER — Encounter: Payer: Self-pay | Admitting: Urology

## 2023-10-17 LAB — PSA: Prostate Specific Ag, Serum: 5.7 ng/mL — ABNORMAL HIGH (ref 0.0–4.0)

## 2023-12-04 ENCOUNTER — Telehealth: Payer: Self-pay | Admitting: Family

## 2023-12-04 NOTE — Telephone Encounter (Signed)
 Patient need lab orders.

## 2023-12-05 ENCOUNTER — Ambulatory Visit: Admitting: Urology

## 2023-12-05 VITALS — BP 127/88 | HR 101 | Ht 68.0 in | Wt 195.4 lb

## 2023-12-05 DIAGNOSIS — R972 Elevated prostate specific antigen [PSA]: Secondary | ICD-10-CM

## 2023-12-05 DIAGNOSIS — Z792 Long term (current) use of antibiotics: Secondary | ICD-10-CM

## 2023-12-05 DIAGNOSIS — Z2989 Encounter for other specified prophylactic measures: Secondary | ICD-10-CM | POA: Diagnosis not present

## 2023-12-05 DIAGNOSIS — C61 Malignant neoplasm of prostate: Secondary | ICD-10-CM | POA: Diagnosis not present

## 2023-12-05 MED ORDER — LEVOFLOXACIN 500 MG PO TABS
500.0000 mg | ORAL_TABLET | Freq: Once | ORAL | Status: AC
  Administered 2023-12-05: 500 mg via ORAL

## 2023-12-05 MED ORDER — GENTAMICIN SULFATE 40 MG/ML IJ SOLN
80.0000 mg | Freq: Once | INTRAMUSCULAR | Status: AC
  Administered 2023-12-05: 80 mg via INTRAMUSCULAR

## 2023-12-05 NOTE — Telephone Encounter (Signed)
 Call pt  Andrew Holloway due 12/15/23 or after He doesn't need lab appt until day of his appt with me Please move his appt until after 5/10 so that insurance will cover  May cancel lab appt    Please ask him to fast for appt with me in may

## 2023-12-05 NOTE — Telephone Encounter (Signed)
 Pt rescheduled to may 13 at 0830

## 2023-12-05 NOTE — Progress Notes (Signed)
   12/05/23  CC:  Chief Complaint  Patient presents with   Prostate Biopsy    HPI: 53 year old male with elevated PSA who presents today for prostate biopsy.  Please see previous notes for details.  Blood pressure 127/88, pulse (!) 101, height 5\' 8"  (1.727 m), weight 195 lb 6 oz (88.6 kg). NED. A&Ox3.   No respiratory distress   Abd soft, NT, ND Normal sphincter tone  Prostate Biopsy Procedure   Informed consent was obtained after discussing risks/benefits of the procedure.  A time out was performed to ensure correct patient identity.  Pre-Procedure: - Last PSA Level:  Lab Results  Component Value Date   PSA 4.49 (H) 09/28/2023   PSA 5.20 (H) 09/17/2023   PSA 2.82 12/26/2021   - Gentamicin given prophylactically - Levaquin 500 mg administered PO -Transrectal Ultrasound performed revealing a 28.4 gm prostate - There was a hypoechoic lesion measuring approximately a centimeter at the right mid base which was sampled.  There was also another hypoechoic lesion along the left side near the bladder neck which was also sampled.  Procedure: - Prostate block performed using 10 cc 1% lidocaine  and biopsies taken from sextant areas, a total of 12 under ultrasound guidance.  Post-Procedure: - Patient tolerated the procedure well - He was counseled to seek immediate medical attention if experiences any severe pain, significant bleeding, or fevers - Return in one week to discuss biopsy results     Dustin Gimenez, MD

## 2023-12-05 NOTE — Patient Instructions (Signed)

## 2023-12-11 ENCOUNTER — Ambulatory Visit: Payer: 59 | Admitting: Family

## 2023-12-12 ENCOUNTER — Ambulatory Visit: Admitting: Urology

## 2023-12-14 ENCOUNTER — Ambulatory Visit (INDEPENDENT_AMBULATORY_CARE_PROVIDER_SITE_OTHER): Admitting: Urology

## 2023-12-14 VITALS — BP 173/98 | HR 101 | Ht 68.0 in | Wt 192.0 lb

## 2023-12-14 DIAGNOSIS — C61 Malignant neoplasm of prostate: Secondary | ICD-10-CM | POA: Diagnosis not present

## 2023-12-14 NOTE — Progress Notes (Signed)
 Elfrieda Grise Plume,acting as a scribe for Dustin Gimenez, MD.,have documented all relevant documentation on the behalf of Dustin Gimenez, MD,as directed by  Dustin Gimenez, MD while in the presence of Dustin Gimenez, MD.  12/14/23 4:55 PM   Andrew Posey Coleson Jr. 11-17-1970 [id number]  Referring provider: Calista Catching, FNP [address] [place],  [place] [address]  Chief Complaint  Patient presents with   Results    HPI: 53 year old male with a personal history of newly diagnosed high-risk prostate cancer presents today for consultation.   His PSA was elevated to 5.5 in February, from a previous baseline of 2.   He has no urinary symptoms and no erectile dysfunction.   Surgical pathology revealed cancer at the right base of the prostate. He underwent 12 biopsies, with three positive cores: two were 3+4, indicating favorable intermediate risk, and one at the apex was 4+4, indicating high risk.   He is concerned about treatment options and potential side effects.   He is also considering genetic testing due to family history of cancer.      IPSS     Row Name 12/14/23 1500         International Prostate Symptom Score   How often have you had the sensation of not emptying your bladder? Not at All     How often have you had to urinate less than every two hours? Not at All     How often have you found you stopped and started again several times when you urinated? Less than 1 in 5 times     How often have you found it difficult to postpone urination? Not at All     How often have you had a weak urinary stream? Less than 1 in 5 times     How often have you had to strain to start urination? Not at All     How many times did you typically get up at night to urinate? 1 Time     Total IPSS Score 3       Quality of Life due to urinary symptoms   If you were to spend the rest of your life with your urinary condition just the way it is now how would you feel about that?  Pleased              Score:  1-7 Mild 8-19 Moderate 20-35 Severe    SHIM     Row Name 12/14/23 1543         SHIM: Over the last 6 months:   How do you rate your confidence that you could get and keep an erection? Very High     When you had erections with sexual stimulation, how often were your erections hard enough for penetration (entering your partner)? Almost Always or Always     During sexual intercourse, how often were you able to maintain your erection after you had penetrated (entered) your partner? Almost Always or Always     During sexual intercourse, how difficult was it to maintain your erection to completion of intercourse? Slightly Difficult     When you attempted sexual intercourse, how often was it satisfactory for you? Almost Always or Always       SHIM Total Score   SHIM 24              PMH: Past Medical History:  Diagnosis Date   Chicken pox    Diabetes mellitus, type  2 (HCC)    History of kidney stones    Motion sickness    back seat of car   Umbilical hernia 10/17/2011   Wears contact lenses     Surgical History: Past Surgical History:  Procedure Laterality Date   ANKLE SURGERY     x2   COLONOSCOPY WITH PROPOFOL  N/A 05/03/2020   Procedure: COLONOSCOPY WITH PROPOFOL ;  Surgeon: Marnee Sink, MD;  Location: [facility];  Service: Endoscopy;  Laterality: N/A;  Diabetic - oral meds priority 4   EPIGASTRIC HERNIA REPAIR N/A 03/30/2015   Procedure: HERNIA REPAIR EPIGASTRIC ADULT;  Surgeon: Marshall Skeeter, MD;  Location: [facility];  Service: General;  Laterality: N/A;   HERNIA REPAIR  2014   umbilical hernia   HERNIA REPAIR  03/30/2015   Epigastric and umbilical hernia with retrorectus 6.4 cm Ventralex mesh   left and right ankle repair ligament     nasal spectrum     rotator cuff right     SHOULDER SURGERY     rotator cuff    Home Medications:  Allergies as of 12/14/2023   No Known Allergies      Medication List         Accurate as of Dec 14, 2023  4:55 PM. If you have any questions, ask your nurse or doctor.          Accu-Chek Guide test strip Generic drug: glucose blood Use to check blood sugar twice daily   APPLE CIDER VINEGAR PO Take 15 mLs by mouth 3 (three) times a week.   blood glucose meter kit and supplies Dispense based on patient and insurance preference. Use up to four times daily as directed. (FOR ICD-10 E10.9, E11.9).   FISH OIL PO Take 1 capsule by mouth in the morning.   Goodys Extra Strength 500-325-65 MG Pack Generic drug: Aspirin-Acetaminophen -Caffeine Take 1 packet by mouth daily as needed (pain.).   ibuprofen 200 MG tablet Commonly known as: ADVIL Take 200 mg by mouth daily as needed (pain.).   losartan  25 MG tablet Commonly known as: COZAAR  TAKE 1 TABLET(25 MG) BY MOUTH DAILY   MAGNESIUM PO Take 1 tablet by mouth every evening.   metFORMIN  1000 MG tablet Commonly known as: GLUCOPHAGE  TAKE 1 TABLET BY MOUTH 2XS DAILY WITH MEAL   mupirocin  ointment 2 % Commonly known as: BACTROBAN  Apply 1 Application topically 2 (two) times daily.   NON FORMULARY Take 1 capsule by mouth daily. Lion's Mane supplement   OVER THE COUNTER MEDICATION Take 1 tablet by mouth daily. Shilajit Extract   rosuvastatin  10 MG tablet Commonly known as: CRESTOR  Take 1 tablet (10 mg total) by mouth daily.   Semaglutide  (2 MG/DOSE) 8 MG/3ML Sopn Inject 2 mg as directed once a week.   ZINC PO Take 1 tablet by mouth daily in the afternoon.        Family History: Family History  Problem Relation Age of Onset   Hypertension Mother    Hyperlipidemia Father    Leukemia Father 3       s/p transplant x 2, died from PML   Diabetes Maternal Grandmother    Heart disease Maternal Grandfather    Hyperlipidemia Maternal Grandfather    Heart disease Paternal Grandfather    Hyperlipidemia Paternal Grandfather    Thyroid  cancer Neg Hx     Social History:  reports that he has never  smoked. He has never used smokeless tobacco. He reports current alcohol use. He reports that he  does not use drugs.   Physical Exam: BP (!) 173/98   Pulse (!) 101   Ht 5\' 8"  (1.727 m)   Wt 192 lb (87.1 kg)   BMI 29.19 kg/m   Constitutional:  Alert and oriented, No acute distress. HEENT: [place] AT, moist mucus membranes.  Trachea midline, no masses. Neurologic: Grossly intact, no focal deficits, moving all 4 extremities. Psychiatric: Normal mood and affect.   Assessment & Plan:    1. High-risk prostate cancer - Plan to obtain a PET scan to rule out metastasis (some suspicion for sampling error given that 4+4 disease is extremely small volume) - Discussed treatment options including surgery, radiation with hormone therapy, and HIFU. - Recommended robotic prostatectomy due to his age and potential for better recovery outcomes. - Discussed potential side effects of surgery, including incontinence and erectile dysfunction, and the plan for penile rehabilitation post-surgery. - Advised holding Ozempic  for one week if surgery is pursued. - Encouraged weight loss of 10 pounds to aid in surgical recovery. - Referral to [facility] for surgical consultation with Dr. Rozanne Corners or Dr. Secundino Dach. - Plan for lymph node dissection during surgery for diagnostic and potential therapeutic benefit.  - The patient was counseled about the natural history of prostate cancer and the standard treatment options that are available for prostate cancer. It was explained to him how his age and life expectancy, clinical stage, Gleason score, and PSA affect his prognosis, the decision to proceed with additional staging studies, as well as how that information influences recommended treatment strategies. We discussed the roles for active surveillance, radiation therapy, surgical therapy, androgen deprivation, as well as ablative therapy options for the treatment of prostate cancer as appropriate to his individual cancer  situation. We discussed the risks and benefits of these options with regard to their impact on cancer control and also in terms of potential adverse events, complications, and impact on quality of life particularly related to urinary, bowel, and sexual function. The patient was encouraged to ask questions throughout the discussion today and all questions were answered to his stated satisfaction. In addition, the patient was provided with and/or directed to appropriate resources and literature for further education about prostate cancer treatment options.  We discussed surgical therapy for prostate cancer including the different available surgical approaches.  Specifically, we discussed robotic prostatectomy with pelvic lymph node dissection based on his restratification.  We discussed, in detail, the risks and expectations of surgery with regard to cancer control, urinary control, and erectile dysfunction as well as expected post operative recovery processed. Additional risks of surgery including but not limited to bleeding, infection, hernia formation, nerve damage, fistula formation, bowel/rectal injury, potentially necessitating colostomy, damage to the urinary tract resulting in urinary leakage, urethral stricture, and cardiopulmonary risk such as myocardial infarction, stroke, death, thromboembolism etc. were explained.   2. Genetic risk assessment - Referral to a geneticist for testing to assess hereditary risk factors for prostate cancer, which may have implications for his children.   I have reviewed the above documentation for accuracy and completeness, and I agree with the above.   Dustin Gimenez, MD   I spent 55 total minutes on the day of the encounter including pre-visit review of the medical record, face-to-face time with the patient, and post visit ordering of labs/imaging/tests.   [facility] [address], [address] [place], [place] [address] [phone]

## 2023-12-18 ENCOUNTER — Encounter: Payer: Self-pay | Admitting: Family

## 2023-12-18 ENCOUNTER — Ambulatory Visit: Admitting: Family

## 2023-12-18 VITALS — BP 122/80 | HR 96 | Temp 98.6°F | Ht 68.0 in | Wt 203.2 lb

## 2023-12-18 DIAGNOSIS — R7309 Other abnormal glucose: Secondary | ICD-10-CM | POA: Diagnosis not present

## 2023-12-18 DIAGNOSIS — Z7984 Long term (current) use of oral hypoglycemic drugs: Secondary | ICD-10-CM | POA: Diagnosis not present

## 2023-12-18 DIAGNOSIS — E785 Hyperlipidemia, unspecified: Secondary | ICD-10-CM

## 2023-12-18 DIAGNOSIS — E119 Type 2 diabetes mellitus without complications: Secondary | ICD-10-CM

## 2023-12-18 DIAGNOSIS — C61 Malignant neoplasm of prostate: Secondary | ICD-10-CM | POA: Diagnosis not present

## 2023-12-18 DIAGNOSIS — Z7985 Long-term (current) use of injectable non-insulin antidiabetic drugs: Secondary | ICD-10-CM

## 2023-12-18 DIAGNOSIS — E1165 Type 2 diabetes mellitus with hyperglycemia: Secondary | ICD-10-CM

## 2023-12-18 LAB — POCT GLYCOSYLATED HEMOGLOBIN (HGB A1C): Hemoglobin A1C: 6 % — AB (ref 4.0–5.6)

## 2023-12-18 MED ORDER — SEMAGLUTIDE (2 MG/DOSE) 8 MG/3ML ~~LOC~~ SOPN: 2.0000 mg | PEN_INJECTOR | SUBCUTANEOUS | 3 refills | Status: DC

## 2023-12-18 MED ORDER — ROSUVASTATIN CALCIUM 10 MG PO TABS
10.0000 mg | ORAL_TABLET | Freq: Every day | ORAL | 3 refills | Status: AC
Start: 2023-12-18 — End: ?

## 2023-12-18 MED ORDER — METFORMIN HCL 1000 MG PO TABS
1000.0000 mg | ORAL_TABLET | Freq: Every day | ORAL | 3 refills | Status: AC
Start: 2023-12-18 — End: ?

## 2023-12-18 MED ORDER — LOSARTAN POTASSIUM 25 MG PO TABS: ORAL_TABLET | ORAL | 3 refills | Status: AC

## 2023-12-18 NOTE — Assessment & Plan Note (Signed)
 Chronic, excellent control.  In the setting of consistent lifestyle changes, we agreed to trial wean down ( and off of ) metformin  2000mg  every day. Continue ozempic  2mg  and start metformin  1000mg  every day.

## 2023-12-18 NOTE — Assessment & Plan Note (Signed)
 Following with [facility] and referral pending to [facility] group for treatment of prostate cancer. Will follow.

## 2023-12-18 NOTE — Progress Notes (Signed)
 Assessment & Plan:   Elevated glucose -     POCT glycosylated hemoglobin (Hb A1C)  Type 2 diabetes mellitus with hyperglycemia, without long-term current use of insulin (HCC) -     Losartan  Potassium; TAKE 1 TABLET(25 MG) BY MOUTH DAILY  Dispense: 90 tablet; Refill: 3 -     metFORMIN  HCl; Take 1 tablet (1,000 mg total) by mouth daily with breakfast.  Dispense: 180 tablet; Refill: 3 -     Semaglutide  (2 MG/DOSE); Inject 2 mg as directed once a week.  Dispense: 3 mL; Refill: 3  Hyperlipidemia, unspecified hyperlipidemia type -     Rosuvastatin  Calcium ; Take 1 tablet (10 mg total) by mouth daily.  Dispense: 90 tablet; Refill: 3  Diabetes mellitus without complication (HCC) Assessment & Plan: Chronic, excellent control.  In the setting of consistent lifestyle changes, we agreed to trial wean down ( and off of ) metformin  2000mg  every day. Continue ozempic  2mg  and start metformin  1000mg  every day.    Prostate cancer [facility]) Assessment & Plan: Following with [facility] and referral pending to [facility] urology group for treatment of prostate cancer. Will follow.       Return precautions given.   Risks, benefits, and alternatives of the medications and treatment plan prescribed today were discussed, and patient expressed understanding.   Education regarding symptom management and diagnosis given to patient on AVS either electronically or printed.  Return in about 3 months (around 03/19/2024).  Bascom Bossier, FNP  Subjective:    Patient ID: Andrew Kaska., male    DOB: February 22, 1971, 53 y.o.   MRN: [id number]  CC: Andrew Lundwall Haslip Marieta Shorten. is a 53 y.o. male who presents today for follow up.   HPI: Overall feels well today.   Recent dx of prostate cancer with Dr Ace Holder Pending PET scan   He remains compliant with Ozempic  2 mg , metformin  to 2000 mg QD . Working on diet and very little carbs. He follows higher protein diet.  Walking daily. FBG and postprandial 120 .   He  is taking berberine and feels that it is lowering his blood sugar.      Allergies: Patient has no known allergies. Current Outpatient Medications on File Prior to Visit  Medication Sig Dispense Refill   APPLE CIDER VINEGAR PO Take 15 mLs by mouth 3 (three) times a week.     Aspirin-Acetaminophen -Caffeine (GOODYS EXTRA STRENGTH) 500-325-65 MG PACK Take 1 packet by mouth daily as needed (pain.).     Berberine Chloride (BERBERINE HCI PO) Take 1 tablet by mouth daily before lunch.     blood glucose meter kit and supplies Dispense based on patient and insurance preference. Use up to four times daily as directed. (FOR ICD-10 E10.9, E11.9). 1 each 0   glucose blood (ACCU-CHEK GUIDE) test strip Use to check blood sugar twice daily 200 each 3   ibuprofen (ADVIL) 200 MG tablet Take 200 mg by mouth daily as needed (pain.).     MAGNESIUM PO Take 1 tablet by mouth every evening.     Multiple Vitamins-Minerals (ZINC PO) Take 1 tablet by mouth daily in the afternoon.     mupirocin  ointment (BACTROBAN ) 2 % Apply 1 Application topically 2 (two) times daily. 22 g 2   NON FORMULARY Take 1 capsule by mouth daily. Lion's Mane supplement     Omega-3 Fatty Acids (FISH OIL PO) Take 1 capsule by mouth in the morning.     OVER THE COUNTER MEDICATION Take 1  tablet by mouth daily. Shilajit Extract     No current facility-administered medications on file prior to visit.    Review of Systems  Constitutional:  Negative for chills and fever.  Respiratory:  Negative for cough.   Cardiovascular:  Negative for chest pain and palpitations.  Gastrointestinal:  Negative for nausea and vomiting.      Objective:    BP 122/80   Pulse 96   Temp 98.6 F (37 C) (Oral)   Ht 5\' 8"  (1.727 m)   Wt 203 lb 3.2 oz (92.2 kg)   SpO2 97%   BMI 30.90 kg/m  BP Readings from Last 3 Encounters:  12/18/23 122/80  12/14/23 (!) 173/98  12/05/23 127/88   Wt Readings from Last 3 Encounters:  12/18/23 203 lb 3.2 oz (92.2 kg)   12/14/23 192 lb (87.1 kg)  12/05/23 195 lb 6 oz (88.6 kg)      09/17/2023    8:34 AM 04/30/2023    8:09 AM 01/26/2023    8:09 AM  Depression screen PHQ 2/9  Decreased Interest 0 0 0  Down, Depressed, Hopeless 0 0 0  PHQ - 2 Score 0 0 0     Physical Exam Vitals reviewed.  Constitutional:      Appearance: He is well-developed.  Cardiovascular:     Rate and Rhythm: Regular rhythm.     Heart sounds: Normal heart sounds.  Pulmonary:     Effort: Pulmonary effort is normal. No respiratory distress.     Breath sounds: Normal breath sounds. No wheezing, rhonchi or rales.  Skin:    General: Skin is warm and dry.  Neurological:     Mental Status: He is alert.  Psychiatric:        Speech: Speech normal.        Behavior: Behavior normal.

## 2023-12-19 ENCOUNTER — Ambulatory Visit: Admitting: Urology

## 2023-12-20 ENCOUNTER — Encounter: Payer: Self-pay | Admitting: Family

## 2023-12-21 ENCOUNTER — Other Ambulatory Visit: Payer: Self-pay

## 2023-12-21 MED ORDER — GLUCOSE BLOOD VI STRP: ORAL_STRIP | 12 refills | Status: AC

## 2023-12-25 ENCOUNTER — Other Ambulatory Visit: Payer: Self-pay

## 2023-12-26 ENCOUNTER — Ambulatory Visit
Admission: RE | Admit: 2023-12-26 | Discharge: 2023-12-26 | Disposition: A | Discharge status: Home / Self Care | Source: Ambulatory Visit | Attending: Urology

## 2023-12-26 DIAGNOSIS — K760 Fatty (change of) liver, not elsewhere classified: Secondary | ICD-10-CM | POA: Diagnosis not present

## 2023-12-26 DIAGNOSIS — C61 Malignant neoplasm of prostate: Secondary | ICD-10-CM | POA: Diagnosis present

## 2023-12-26 MED ORDER — FLOTUFOLASTAT F 18 GALLIUM 296-5846 MBQ/ML IV SOLN
8.0000 | Freq: Once | INTRAVENOUS | Status: AC
  Administered 2023-12-26: 8.65 via INTRAVENOUS
  Filled 2023-12-26: qty 8

## 2024-01-01 ENCOUNTER — Telehealth: Payer: Self-pay | Admitting: Urology

## 2024-01-01 NOTE — Telephone Encounter (Signed)
 Results are not finalized, will let Dr Ace Holder review to see if she is able to interpret these

## 2024-01-01 NOTE — Telephone Encounter (Signed)
 Pt called asking about PET results

## 2024-01-02 NOTE — Telephone Encounter (Signed)
 Patient advised.

## 2024-01-02 NOTE — Telephone Encounter (Signed)
 PET scan shows prostate cancer is confined to the prostate.  Great news.  Wishing you the best of luck and good health.  Dustin Gimenez, MD

## 2024-01-16 ENCOUNTER — Encounter: Payer: Self-pay | Admitting: Family

## 2024-01-16 ENCOUNTER — Telehealth: Payer: Self-pay

## 2024-01-16 NOTE — Telephone Encounter (Signed)
 I left a voicemail for patient and sent a letter to him via MyChart letting him know that we need to reschedule his 03/24/2024 appointment with Bascom Bossier, FNP, as she will be out of the office on that day.  E2C2 - when patient calls back, please assist him with rescheduling his visit with Bascom Bossier, FNP.

## 2024-01-22 ENCOUNTER — Other Ambulatory Visit: Payer: Self-pay | Admitting: Urology

## 2024-03-14 NOTE — Patient Instructions (Addendum)
 SURGICAL WAITING ROOM VISITATION  Patients having surgery or a procedure may have no more than 2 support people in the waiting area - these visitors may rotate.    Children under the age of 26 must have an adult with them who is not the patient.  Visitors with respiratory illnesses are discouraged from visiting and should remain at home.  If the patient needs to stay at the hospital during part of their recovery, the visitor guidelines for inpatient rooms apply. Pre-op nurse will coordinate an appropriate time for 1 support person to accompany patient in pre-op.  This support person may not rotate.    Please refer to the [facility] website for the visitor guidelines for Inpatients (after your surgery is over and you are in a regular room).    Your procedure is scheduled on: 03/28/24   Report to [facility] Main Entrance    Report to admitting at 10:15 AM   Call this number if you have problems the morning of surgery [phone]   Follow a clear liquid diet the day before surgery.  Water  Non-Citrus Juices (without pulp, NO RED-Apple, White grape, White cranberry) Black Coffee (NO MILK/CREAM OR CREAMERS, sugar ok)  Clear Tea (NO MILK/CREAM OR CREAMERS, sugar ok) regular and decaf                             Plain Jell-O (NO RED)                                           Fruit ices (not with fruit pulp, NO RED)                                     Popsicles (NO RED)                                                               Sports drinks like Gatorade (NO RED)              Nothing to drink after midnight.         If you have questions, please contact your surgeon's office.   FOLLOW BOWEL PREP AND ANY ADDITIONAL PRE OP INSTRUCTIONS YOU RECEIVED FROM YOUR SURGEON'S OFFICE!!!     Oral Hygiene is also important to reduce your risk of infection.                                    Remember - BRUSH YOUR TEETH THE MORNING OF SURGERY WITH YOUR REGULAR TOOTHPASTE  DENTURES  WILL BE REMOVED PRIOR TO SURGERY PLEASE DO NOT APPLY Poly grip OR ADHESIVES!!!   Stop all vitamins and herbal supplements 7 days before surgery.   Take these medicines the morning of surgery with A SIP OF WATER : Rosuvastatin   DO NOT TAKE ANY ORAL DIABETIC MEDICATIONS DAY OF YOUR SURGERY  How to Manage Your Diabetes Before and After Surgery  Why is it important to control my blood sugar before and  after surgery? Improving blood sugar levels before and after surgery helps healing and can limit problems. A way of improving blood sugar control is eating a healthy diet by:  Eating less sugar and carbohydrates  Increasing activity/exercise  Talking with your doctor about reaching your blood sugar goals High blood sugars (greater than 180 mg/dL) can raise your risk of infections and slow your recovery, so you will need to focus on controlling your diabetes during the weeks before surgery. Make sure that the doctor who takes care of your diabetes knows about your planned surgery including the date and location.  How do I manage my blood sugar before surgery? Check your blood sugar at least 4 times a day, starting 2 days before surgery, to make sure that the level is not too high or low. Check your blood sugar the morning of your surgery when you wake up and every 2 hours until you get to the Short Stay unit. If your blood sugar is less than 70 mg/dL, you will need to treat for low blood sugar: Do not take insulin. Treat a low blood sugar (less than 70 mg/dL) with  cup of clear juice (cranberry or apple), 4 glucose tablets, OR glucose gel. Recheck blood sugar in 15 minutes after treatment (to make sure it is greater than 70 mg/dL). If your blood sugar is not greater than 70 mg/dL on recheck, call [phone] for further instructions. Report your blood sugar to the short stay nurse when you get to Short Stay.  If you are admitted to the hospital after surgery: Your blood sugar will be checked  by the staff and you will probably be given insulin after surgery (instead of oral diabetes medicines) to make sure you have good blood sugar levels. The goal for blood sugar control after surgery is 80-180 mg/dL.   WHAT DO I DO ABOUT MY DIABETES MEDICATION?  Do not take oral diabetes medicines (pills) the morning of surgery.  Hold Ozempic  7 days prior to surgery. Do not take after 03/20/24.  THE DAY BEFORE SURGERY, take Metformin  as prescribed.      THE MORNING OF SURGERY, do not take Metformin .  DO NOT TAKE THE FOLLOWING 7 DAYS PRIOR TO SURGERY: Ozempic , Wegovy , Rybelsus  (Semaglutide ), Byetta (exenatide), Bydureon (exenatide ER), Victoza, Saxenda (liraglutide), or Trulicity (dulaglutide) Mounjaro (Tirzepatide) Adlyxin (Lixisenatide), Polyethylene Glycol Loxenatide.  Reviewed and Endorsed by [facility] Patient Education Committee, August 2015             You may not have any metal on your body including jewelry, and body piercing             Do not wear lotions, powders, cologne, or deodorant              Men may shave face and neck.   Do not bring valuables to the hospital. [place] IS NOT             RESPONSIBLE   FOR VALUABLES.   Contacts, glasses, dentures or bridgework may not be worn into surgery.   Bring small overnight bag day of surgery.   DO NOT BRING YOUR HOME MEDICATIONS TO THE HOSPITAL. PHARMACY WILL DISPENSE MEDICATIONS LISTED ON YOUR MEDICATION LIST TO YOU DURING YOUR ADMISSION IN THE HOSPITAL!              Please read over the following fact sheets you were given: IF YOU HAVE QUESTIONS ABOUT YOUR PRE-OP INSTRUCTIONS PLEASE CALL [phone] [place].   If you received  a COVID test during your pre-op visit  it is requested that you wear a mask when out in public, stay away from anyone that may not be feeling well and notify your surgeon if you develop symptoms. If you test positive for Covid or have been in contact with anyone that has tested positive in the  last 10 days please notify you surgeon.    [place] - Preparing for Surgery Before surgery, you can play an important role.  Because skin is not sterile, your skin needs to be as free of germs as possible.  You can reduce the number of germs on your skin by washing with CHG (chlorahexidine gluconate) soap before surgery.  CHG is an antiseptic cleaner which kills germs and bonds with the skin to continue killing germs even after washing. Please DO NOT use if you have an allergy to CHG or antibacterial soaps.  If your skin becomes reddened/irritated stop using the CHG and inform your nurse when you arrive at Short Stay. Do not shave (including legs and underarms) for at least 48 hours prior to the first CHG shower.  You may shave your face/neck.  Please follow these instructions carefully:  1.  Shower with CHG Soap the night before surgery and the  morning of surgery.  2.  If you choose to wash your hair, wash your hair first as usual with your normal  shampoo.  3.  After you shampoo, rinse your hair and body thoroughly to remove the shampoo.                             4.  Use CHG as you would any other liquid soap.  You can apply chg directly to the skin and wash.  Gently with a scrungie or clean washcloth.  5.  Apply the CHG Soap to your body ONLY FROM THE NECK DOWN.   Do   not use on face/ open                           Wound or open sores. Avoid contact with eyes, ears mouth and   genitals (private parts).                       Wash face,  Genitals (private parts) with your normal soap.             6.  Wash thoroughly, paying special attention to the area where your    surgery  will be performed.  7.  Thoroughly rinse your body with warm water  from the neck down.  8.  DO NOT shower/wash with your normal soap after using and rinsing off the CHG Soap.                9.  Pat yourself dry with a clean towel.            10.  Wear clean pajamas.            11.  Place clean sheets on your bed the  night of your first shower and do not  sleep with pets. Day of Surgery : Do not apply any lotions/deodorants the morning of surgery.  Please wear clean clothes to the hospital/surgery center.  FAILURE TO FOLLOW THESE INSTRUCTIONS MAY RESULT IN THE CANCELLATION OF YOUR SURGERY  PATIENT SIGNATURE_________________________________  NURSE SIGNATURE__________________________________  ________________________________________________________________________

## 2024-03-14 NOTE — Progress Notes (Addendum)
 COVID Vaccine Completed: yes  Date of COVID positive in last 90 days:  PCP - Rollene Northern, FNP Cardiologist - n/a  Chest x-ray - n/a EKG - 03/17/24 Epic/chart Stress Test - n/a ECHO - n/a Cardiac Cath - n/a Pacemaker/ICD device last checked: n/a Spinal Cord Stimulator: n/a  Bowel Prep - clear liquids day before, magnesium citrate and fleet enema. Patient aware  Sleep Study - no per pt CPAP -   Fasting Blood Sugar - 120-190 Checks Blood Sugar 1-2 times a day  Last dose of GLP1 agonist-  Ozempic , takes Mondays GLP1 instructions:  Do not take after   03/20/24. Last dose 03/17/24   Last dose of SGLT-2 inhibitors-  N/A SGLT-2 instructions:  Do not take after     Blood Thinner Instructions:  Last dose: n/a Time: Aspirin Instructions: Last Dose:  Activity level: Can go up a flight of stairs and perform activities of daily living without stopping and without symptoms of chest pain or shortness of breath.  Anesthesia review:   Patient denies shortness of breath, fever, cough and chest pain at PAT appointment  Patient verbalized understanding of instructions that were given to them at the PAT appointment. Patient was also instructed that they will need to review over the PAT instructions again at home before surgery.

## 2024-03-17 ENCOUNTER — Encounter (HOSPITAL_COMMUNITY)
Admission: RE | Admit: 2024-03-17 | Discharge: 2024-03-17 | Disposition: A | Discharge status: Home / Self Care | Source: Ambulatory Visit | Attending: Urology | Admitting: Urology

## 2024-03-17 ENCOUNTER — Encounter (HOSPITAL_COMMUNITY): Payer: Self-pay

## 2024-03-17 ENCOUNTER — Other Ambulatory Visit: Payer: Self-pay

## 2024-03-17 VITALS — BP 134/91 | HR 91 | Temp 98.4°F | Resp 16 | Ht 68.0 in | Wt 198.0 lb

## 2024-03-17 DIAGNOSIS — Z01818 Encounter for other preprocedural examination: Secondary | ICD-10-CM | POA: Insufficient documentation

## 2024-03-17 DIAGNOSIS — E119 Type 2 diabetes mellitus without complications: Secondary | ICD-10-CM | POA: Insufficient documentation

## 2024-03-17 LAB — CBC
HCT: 49.1 % (ref 39.0–52.0)
Hemoglobin: 16.2 g/dL (ref 13.0–17.0)
MCH: 29.9 pg (ref 26.0–34.0)
MCHC: 33 g/dL (ref 30.0–36.0)
MCV: 90.8 fL (ref 80.0–100.0)
Platelets: 272 K/uL (ref 150–400)
RBC: 5.41 MIL/uL (ref 4.22–5.81)
RDW: 13.2 % (ref 11.5–15.5)
WBC: 7.2 K/uL (ref 4.0–10.5)
nRBC: 0 % (ref 0.0–0.2)

## 2024-03-17 LAB — GLUCOSE, CAPILLARY: Glucose-Capillary: 146 mg/dL — ABNORMAL HIGH (ref 70–99)

## 2024-03-17 LAB — BASIC METABOLIC PANEL WITH GFR
Anion gap: 12 (ref 5–15)
BUN: 13 mg/dL (ref 6–20)
CO2: 24 mmol/L (ref 22–32)
Calcium: 10 mg/dL (ref 8.9–10.3)
Chloride: 103 mmol/L (ref 98–111)
Creatinine, Ser: 0.81 mg/dL (ref 0.61–1.24)
GFR, Estimated: >60 mL/min (ref >60)
Glucose, Bld: 146 mg/dL — ABNORMAL HIGH (ref 70–99)
Potassium: 4.2 mmol/L (ref 3.5–5.1)
Sodium: 139 mmol/L (ref 135–145)

## 2024-03-17 LAB — TYPE AND SCREEN
ABO/RH(D): A POS
Antibody Screen: NEGATIVE

## 2024-03-18 LAB — HEMOGLOBIN A1C
Hgb A1c MFr Bld: 6.9 % — ABNORMAL HIGH (ref 4.8–5.6)
Mean Plasma Glucose: 151 mg/dL

## 2024-03-24 ENCOUNTER — Ambulatory Visit: Admitting: Family

## 2024-03-28 ENCOUNTER — Ambulatory Visit (HOSPITAL_COMMUNITY): Admitting: Anesthesiology

## 2024-03-28 ENCOUNTER — Encounter (HOSPITAL_COMMUNITY)
Admission: AD | Disposition: A | Discharge status: Home / Self Care | Payer: Self-pay | Source: Home / Self Care | Attending: Urology

## 2024-03-28 ENCOUNTER — Encounter (HOSPITAL_COMMUNITY): Payer: Self-pay | Admitting: Urology

## 2024-03-28 ENCOUNTER — Other Ambulatory Visit: Payer: Self-pay

## 2024-03-28 ENCOUNTER — Encounter (HOSPITAL_COMMUNITY): Payer: Self-pay | Admitting: Medical

## 2024-03-28 ENCOUNTER — Inpatient Hospital Stay (HOSPITAL_COMMUNITY)
Admission: AD | Admit: 2024-03-28 | Discharge: 2024-03-29 | DRG: 708 | Disposition: A | Discharge status: Home / Self Care | Attending: Urology | Admitting: Urology

## 2024-03-28 DIAGNOSIS — C61 Malignant neoplasm of prostate: Secondary | ICD-10-CM | POA: Diagnosis not present

## 2024-03-28 DIAGNOSIS — Z87442 Personal history of urinary calculi: Secondary | ICD-10-CM

## 2024-03-28 DIAGNOSIS — Z8249 Family history of ischemic heart disease and other diseases of the circulatory system: Secondary | ICD-10-CM | POA: Diagnosis not present

## 2024-03-28 DIAGNOSIS — E119 Type 2 diabetes mellitus without complications: Secondary | ICD-10-CM | POA: Diagnosis present

## 2024-03-28 DIAGNOSIS — Z803 Family history of malignant neoplasm of breast: Secondary | ICD-10-CM

## 2024-03-28 DIAGNOSIS — N400 Enlarged prostate without lower urinary tract symptoms: Secondary | ICD-10-CM | POA: Diagnosis present

## 2024-03-28 DIAGNOSIS — Z83438 Family history of other disorder of lipoprotein metabolism and other lipidemia: Secondary | ICD-10-CM

## 2024-03-28 DIAGNOSIS — Z806 Family history of leukemia: Secondary | ICD-10-CM | POA: Diagnosis not present

## 2024-03-28 DIAGNOSIS — I10 Essential (primary) hypertension: Secondary | ICD-10-CM | POA: Diagnosis present

## 2024-03-28 DIAGNOSIS — Z833 Family history of diabetes mellitus: Secondary | ICD-10-CM | POA: Diagnosis not present

## 2024-03-28 DIAGNOSIS — Z79899 Other long term (current) drug therapy: Secondary | ICD-10-CM

## 2024-03-28 HISTORY — PX: ROBOT ASSISTED LAPAROSCOPIC RADICAL PROSTATECTOMY: SHX5141

## 2024-03-28 LAB — BASIC METABOLIC PANEL WITH GFR
Anion gap: 14 (ref 5–15)
BUN: 13 mg/dL (ref 6–20)
CO2: 22 mmol/L (ref 22–32)
Calcium: 8.8 mg/dL — ABNORMAL LOW (ref 8.9–10.3)
Chloride: 104 mmol/L (ref 98–111)
Creatinine, Ser: 0.96 mg/dL (ref 0.61–1.24)
GFR, Estimated: >60 mL/min (ref >60)
Glucose, Bld: 191 mg/dL — ABNORMAL HIGH (ref 70–99)
Potassium: 4 mmol/L (ref 3.5–5.1)
Sodium: 140 mmol/L (ref 135–145)

## 2024-03-28 LAB — HEMOGLOBIN AND HEMATOCRIT, BLOOD
HCT: 48.3 % (ref 39.0–52.0)
Hemoglobin: 15.4 g/dL (ref 13.0–17.0)

## 2024-03-28 LAB — GLUCOSE, CAPILLARY
Glucose-Capillary: 162 mg/dL — ABNORMAL HIGH (ref 70–99)
Glucose-Capillary: 127 mg/dL — ABNORMAL HIGH (ref 70–99)
Glucose-Capillary: 186 mg/dL — ABNORMAL HIGH (ref 70–99)
Glucose-Capillary: 205 mg/dL — ABNORMAL HIGH (ref 70–99)

## 2024-03-28 LAB — ABO/RH: ABO/RH(D): A POS

## 2024-03-28 SURGERY — PROSTATECTOMY, RADICAL, ROBOT-ASSISTED, LAPAROSCOPIC
Anesthesia: General

## 2024-03-28 MED ORDER — KETOROLAC TROMETHAMINE 15 MG/ML IJ SOLN
15.0000 mg | Freq: Four times a day (QID) | INTRAMUSCULAR | Status: DC
  Administered 2024-03-28 – 2024-03-29 (×4): 15 mg via INTRAVENOUS
  Filled 2024-03-28 (×3): qty 1

## 2024-03-28 MED ORDER — FENTANYL CITRATE (PF) 100 MCG/2ML IJ SOLN
INTRAMUSCULAR | Status: AC
  Filled 2024-03-28: qty 2

## 2024-03-28 MED ORDER — OXYCODONE HCL 5 MG PO TABS
ORAL_TABLET | ORAL | Status: AC
  Filled 2024-03-28: qty 1

## 2024-03-28 MED ORDER — ROCURONIUM BROMIDE 10 MG/ML (PF) SYRINGE
PREFILLED_SYRINGE | INTRAVENOUS | Status: AC
  Filled 2024-03-28: qty 10

## 2024-03-28 MED ORDER — DEXAMETHASONE SODIUM PHOSPHATE 10 MG/ML IJ SOLN
INTRAMUSCULAR | Status: DC | PRN
Start: 2024-03-28 — End: 2024-03-28
  Administered 2024-03-28: 4 mg via INTRAVENOUS

## 2024-03-28 MED ORDER — ORAL CARE MOUTH RINSE: 15.0000 mL | Freq: Once | OROMUCOSAL | Status: DC

## 2024-03-28 MED ORDER — PHENYLEPHRINE 80 MCG/ML (10ML) SYRINGE FOR IV PUSH (FOR BLOOD PRESSURE SUPPORT)
PREFILLED_SYRINGE | INTRAVENOUS | Status: DC | PRN
  Administered 2024-03-28: 120 ug via INTRAVENOUS
  Administered 2024-03-28 (×2): 80 ug via INTRAVENOUS

## 2024-03-28 MED ORDER — LACTATED RINGERS IR SOLN
Status: DC | PRN
  Administered 2024-03-28: 1000 mL

## 2024-03-28 MED ORDER — SODIUM CHLORIDE 0.9% FLUSH
INTRAVENOUS | Status: DC | PRN
  Administered 2024-03-28: 20 mL

## 2024-03-28 MED ORDER — SODIUM CHLORIDE (PF) 0.9 % IJ SOLN
INTRAMUSCULAR | Status: AC
  Filled 2024-03-28: qty 20

## 2024-03-28 MED ORDER — KCL IN DEXTROSE-NACL 20-5-0.45 MEQ/L-%-% IV SOLN
INTRAVENOUS | Status: DC
  Filled 2024-03-28 (×2): qty 1000

## 2024-03-28 MED ORDER — FENTANYL CITRATE PF 50 MCG/ML IJ SOSY
PREFILLED_SYRINGE | INTRAMUSCULAR | Status: AC
  Filled 2024-03-28: qty 1

## 2024-03-28 MED ORDER — SODIUM CHLORIDE 0.9 % IV BOLUS
1000.0000 mL | Freq: Once | INTRAVENOUS | Status: AC
  Administered 2024-03-28: 1000 mL via INTRAVENOUS

## 2024-03-28 MED ORDER — DEXMEDETOMIDINE HCL IN NACL 80 MCG/20ML IV SOLN
INTRAVENOUS | Status: AC
  Filled 2024-03-28: qty 20

## 2024-03-28 MED ORDER — MIDAZOLAM HCL 2 MG/2ML IJ SOLN
INTRAMUSCULAR | Status: AC
Start: 2024-03-28 — End: 2024-03-28
  Filled 2024-03-28: qty 2

## 2024-03-28 MED ORDER — INSULIN ASPART 100 UNIT/ML IJ SOLN
0.0000 [IU] | INTRAMUSCULAR | Status: AC | PRN
  Administered 2024-03-28: 2 [IU] via SUBCUTANEOUS
  Administered 2024-03-28: 4 [IU] via SUBCUTANEOUS
  Filled 2024-03-28: qty 1

## 2024-03-28 MED ORDER — LACTATED RINGERS IV SOLN: INTRAVENOUS | Status: DC | PRN

## 2024-03-28 MED ORDER — LIDOCAINE HCL (PF) 2 % IJ SOLN
INTRAMUSCULAR | Status: DC | PRN
  Administered 2024-03-28: 1.5 mg/kg/h

## 2024-03-28 MED ORDER — LIDOCAINE HCL (PF) 2 % IJ SOLN
INTRAMUSCULAR | Status: AC
  Filled 2024-03-28: qty 5

## 2024-03-28 MED ORDER — LACTATED RINGERS IV SOLN: INTRAVENOUS | Status: DC

## 2024-03-28 MED ORDER — OXYCODONE HCL 5 MG PO TABS
5.0000 mg | ORAL_TABLET | ORAL | Status: DC | PRN
  Administered 2024-03-28 – 2024-03-29 (×4): 5 mg via ORAL
  Filled 2024-03-28 (×3): qty 1

## 2024-03-28 MED ORDER — PROPOFOL 10 MG/ML IV BOLUS
INTRAVENOUS | Status: AC
  Filled 2024-03-28: qty 20

## 2024-03-28 MED ORDER — KETAMINE HCL 50 MG/5ML IJ SOSY
PREFILLED_SYRINGE | INTRAMUSCULAR | Status: AC
  Filled 2024-03-28: qty 5

## 2024-03-28 MED ORDER — HYDROMORPHONE HCL 1 MG/ML IJ SOLN
INTRAMUSCULAR | Status: DC | PRN
  Administered 2024-03-28 (×2): .5 mg via INTRAVENOUS

## 2024-03-28 MED ORDER — ACETAMINOPHEN 10 MG/ML IV SOLN
INTRAVENOUS | Status: AC
  Filled 2024-03-28: qty 100

## 2024-03-28 MED ORDER — BUPIVACAINE LIPOSOME 1.3 % IJ SUSP
INTRAMUSCULAR | Status: AC
  Filled 2024-03-28: qty 20

## 2024-03-28 MED ORDER — OXYCODONE HCL 5 MG PO TABS: 5.0000 mg | ORAL_TABLET | Freq: Once | ORAL | Status: DC | PRN

## 2024-03-28 MED ORDER — DEXAMETHASONE SODIUM PHOSPHATE 10 MG/ML IJ SOLN
INTRAMUSCULAR | Status: AC
  Filled 2024-03-28: qty 1

## 2024-03-28 MED ORDER — ROCURONIUM BROMIDE 10 MG/ML (PF) SYRINGE
PREFILLED_SYRINGE | INTRAVENOUS | Status: DC | PRN
  Administered 2024-03-28: 20 mg via INTRAVENOUS
  Administered 2024-03-28: 10 mg via INTRAVENOUS
  Administered 2024-03-28: 20 mg via INTRAVENOUS
  Administered 2024-03-28: 30 mg via INTRAVENOUS
  Administered 2024-03-28: 20 mg via INTRAVENOUS
  Administered 2024-03-28: 60 mg via INTRAVENOUS
  Administered 2024-03-28: 10 mg via INTRAVENOUS

## 2024-03-28 MED ORDER — PHENYLEPHRINE 80 MCG/ML (10ML) SYRINGE FOR IV PUSH (FOR BLOOD PRESSURE SUPPORT)
PREFILLED_SYRINGE | INTRAVENOUS | Status: AC
Start: 2024-03-28 — End: 2024-03-28
  Filled 2024-03-28: qty 10

## 2024-03-28 MED ORDER — FENTANYL CITRATE PF 50 MCG/ML IJ SOSY
25.0000 ug | PREFILLED_SYRINGE | INTRAMUSCULAR | Status: DC | PRN
  Administered 2024-03-28 (×2): 50 ug via INTRAVENOUS

## 2024-03-28 MED ORDER — MEPIVACAINE HCL (PF) 2 % IJ SOLN
INTRAMUSCULAR | Status: AC
  Filled 2024-03-28: qty 40

## 2024-03-28 MED ORDER — KETAMINE HCL 50 MG/5ML IJ SOSY
PREFILLED_SYRINGE | INTRAMUSCULAR | Status: DC | PRN
  Administered 2024-03-28: 30 mg via INTRAVENOUS

## 2024-03-28 MED ORDER — ONDANSETRON HCL 4 MG/2ML IJ SOLN
INTRAMUSCULAR | Status: AC
  Filled 2024-03-28: qty 2

## 2024-03-28 MED ORDER — BUPIVACAINE LIPOSOME 1.3 % IJ SUSP
INTRAMUSCULAR | Status: DC | PRN
  Administered 2024-03-28: 20 mL

## 2024-03-28 MED ORDER — ONDANSETRON HCL 4 MG/2ML IJ SOLN
INTRAMUSCULAR | Status: DC | PRN
  Administered 2024-03-28: 4 mg via INTRAVENOUS

## 2024-03-28 MED ORDER — INSULIN ASPART 100 UNIT/ML IJ SOLN
0.0000 [IU] | Freq: Three times a day (TID) | INTRAMUSCULAR | Status: DC
  Administered 2024-03-29: 3 [IU] via SUBCUTANEOUS
  Administered 2024-03-29: 4 [IU] via SUBCUTANEOUS

## 2024-03-28 MED ORDER — SUGAMMADEX SODIUM 200 MG/2ML IV SOLN
INTRAVENOUS | Status: DC | PRN
  Administered 2024-03-28: 200 mg via INTRAVENOUS

## 2024-03-28 MED ORDER — PROPOFOL 10 MG/ML IV BOLUS
INTRAVENOUS | Status: DC | PRN
Start: 2024-03-28 — End: 2024-03-28
  Administered 2024-03-28: 150 mg via INTRAVENOUS

## 2024-03-28 MED ORDER — OXYCODONE HCL 5 MG/5ML PO SOLN: 5.0000 mg | Freq: Once | ORAL | Status: DC | PRN

## 2024-03-28 MED ORDER — HEMOSTATIC AGENTS (NO CHARGE) OPTIME
TOPICAL | Status: DC | PRN
  Administered 2024-03-28: 1 via TOPICAL

## 2024-03-28 MED ORDER — ACETAMINOPHEN 10 MG/ML IV SOLN
1000.0000 mg | Freq: Once | INTRAVENOUS | Status: DC | PRN
  Administered 2024-03-28: 1000 mg via INTRAVENOUS

## 2024-03-28 MED ORDER — KETOROLAC TROMETHAMINE 15 MG/ML IJ SOLN
INTRAMUSCULAR | Status: AC
  Filled 2024-03-28: qty 1

## 2024-03-28 MED ORDER — LIDOCAINE HCL (PF) 2 % IJ SOLN
INTRAMUSCULAR | Status: DC | PRN
  Administered 2024-03-28: 100 mg via INTRADERMAL

## 2024-03-28 MED ORDER — PHENYLEPHRINE HCL-NACL 20-0.9 MG/250ML-% IV SOLN
INTRAVENOUS | Status: DC | PRN
  Administered 2024-03-28: 15 ug/min via INTRAVENOUS

## 2024-03-28 MED ORDER — DOCUSATE SODIUM 100 MG PO CAPS: 100.0000 mg | ORAL_CAPSULE | Freq: Every day | ORAL | 0 refills | Status: DC | PRN

## 2024-03-28 MED ORDER — CHLORHEXIDINE GLUCONATE 0.12 % MT SOLN: 15.0000 mL | Freq: Once | OROMUCOSAL | Status: DC

## 2024-03-28 MED ORDER — HYDROMORPHONE HCL 2 MG/ML IJ SOLN
INTRAMUSCULAR | Status: AC
  Filled 2024-03-28: qty 1

## 2024-03-28 MED ORDER — CEFAZOLIN SODIUM 1 G IJ SOLR
INTRAMUSCULAR | Status: AC
  Filled 2024-03-28: qty 20

## 2024-03-28 MED ORDER — CEFAZOLIN SODIUM-DEXTROSE 2-4 GM/100ML-% IV SOLN
2.0000 g | INTRAVENOUS | Status: AC
  Administered 2024-03-28 (×2): 2 g via INTRAVENOUS
  Filled 2024-03-28: qty 100

## 2024-03-28 MED ORDER — FENTANYL CITRATE (PF) 100 MCG/2ML IJ SOLN
INTRAMUSCULAR | Status: DC | PRN
  Administered 2024-03-28 (×3): 50 ug via INTRAVENOUS

## 2024-03-28 MED ORDER — OXYCODONE-ACETAMINOPHEN 5-325 MG PO TABS: 1.0000 | ORAL_TABLET | ORAL | 0 refills | Status: DC | PRN

## 2024-03-28 MED ORDER — MORPHINE SULFATE (PF) 2 MG/ML IV SOLN
2.0000 mg | INTRAVENOUS | Status: DC | PRN
  Administered 2024-03-28: 2 mg via INTRAVENOUS
  Filled 2024-03-28: qty 1

## 2024-03-28 MED ORDER — INSULIN ASPART 100 UNIT/ML IJ SOLN
0.0000 [IU] | Freq: Every day | INTRAMUSCULAR | Status: DC
  Administered 2024-03-28: 2 [IU] via SUBCUTANEOUS

## 2024-03-28 MED ORDER — LIDOCAINE HCL (PF) 2 % IJ SOLN
INTRAMUSCULAR | Status: AC
  Filled 2024-03-28: qty 20

## 2024-03-28 SURGICAL SUPPLY — 61 items
APPLICATOR COTTON TIP 6 STRL (MISCELLANEOUS) ×2 IMPLANT
APPLICATOR SURGIFLO ENDO (HEMOSTASIS) IMPLANT
BAG COUNTER SPONGE SURGICOUNT (BAG) IMPLANT
CATH FOLEY 2WAY SLVR 5CC 18FR (CATHETERS) ×2 IMPLANT
CATH ROBINSON RED A/P 16FR (CATHETERS) ×2 IMPLANT
CATH SILICONE 5CC 18FR (INSTRUMENTS) ×2 IMPLANT
CHLORAPREP W/TINT 26 (MISCELLANEOUS) ×2 IMPLANT
CLIP LIGATING HEM O LOK PURPLE (MISCELLANEOUS) ×2 IMPLANT
COVER SURGICAL LIGHT HANDLE (MISCELLANEOUS) ×2 IMPLANT
COVER TIP SHEARS 8 DVNC (MISCELLANEOUS) ×2 IMPLANT
CUTTER ECHEON FLEX ENDO 45 340 (ENDOMECHANICALS) IMPLANT
DERMABOND ADVANCED .7 DNX12 (GAUZE/BANDAGES/DRESSINGS) ×2 IMPLANT
DRAPE ARM DVNC X/XI (DISPOSABLE) ×8 IMPLANT
DRAPE COLUMN DVNC XI (DISPOSABLE) ×2 IMPLANT
DRAPE SURG IRRIG POUCH 19X23 (DRAPES) ×2 IMPLANT
DRIVER NDL LRG 8 DVNC XI (INSTRUMENTS) ×4 IMPLANT
DRIVER NDLE LRG 8 DVNC XI (INSTRUMENTS) ×4 IMPLANT
DRSG TEGADERM 4X4.75 (GAUZE/BANDAGES/DRESSINGS) IMPLANT
ELECT PENCIL ROCKER SW 15FT (MISCELLANEOUS) ×2 IMPLANT
ELECT REM PT RETURN 15FT ADLT (MISCELLANEOUS) ×2 IMPLANT
FORCEPS BPLR 8 MD DVNC XI (FORCEP) ×2 IMPLANT
FORCEPS BPLR FENES DVNC XI (FORCEP) ×2 IMPLANT
FORCEPS PROGRASP DVNC XI (FORCEP) ×2 IMPLANT
GAUZE 4X4 16PLY ~~LOC~~+RFID DBL (SPONGE) IMPLANT
GAUZE SPONGE 4X4 12PLY STRL (GAUZE/BANDAGES/DRESSINGS) ×2 IMPLANT
GLOVE BIO SURGEON STRL SZ 6.5 (GLOVE) ×2 IMPLANT
GLOVE BIOGEL M 7.0 STRL (GLOVE) ×4 IMPLANT
GLOVE BIOGEL PI IND STRL 7.5 (GLOVE) ×4 IMPLANT
GOWN STRL REUS W/ TWL XL LVL3 (GOWN DISPOSABLE) ×4 IMPLANT
GOWN STRL SURGICAL XL XLNG (GOWN DISPOSABLE) ×2 IMPLANT
HEMOSTAT SURGICEL 4X8 (HEMOSTASIS) IMPLANT
HOLDER FOLEY CATH W/STRAP (MISCELLANEOUS) ×2 IMPLANT
IRRIGATION SUCT STRKRFLW 2 WTP (MISCELLANEOUS) ×2 IMPLANT
IV LACTATED RINGERS 1000ML (IV SOLUTION) ×2 IMPLANT
KIT TURNOVER KIT A (KITS) ×2 IMPLANT
MARKER SKIN DUAL TIP RULER LAB (MISCELLANEOUS) ×2 IMPLANT
NDL INSUFFLATION 14GA 120MM (NEEDLE) ×2 IMPLANT
NEEDLE INSUFFLATION 14GA 120MM (NEEDLE) ×2 IMPLANT
PACK ROBOT UROLOGY CUSTOM (CUSTOM PROCEDURE TRAY) ×2 IMPLANT
PLUG CATH AND CAP STRL 200 (CATHETERS) IMPLANT
PROTECTOR NERVE ULNAR (MISCELLANEOUS) ×2 IMPLANT
RELOAD STAPLE 45 4.1 GRN THCK (STAPLE) IMPLANT
SCISSORS LAP 5X45 EPIX DISP (ENDOMECHANICALS) IMPLANT
SCISSORS MNPLR CVD DVNC XI (INSTRUMENTS) ×2 IMPLANT
SEAL UNIV 5-12 XI (MISCELLANEOUS) ×8 IMPLANT
SET TUBE SMOKE EVAC HIGH FLOW (TUBING) ×2 IMPLANT
SOL PREP POV-IOD 4OZ 10% (MISCELLANEOUS) ×2 IMPLANT
SOLUTION ELECTROSURG ANTI STCK (MISCELLANEOUS) ×2 IMPLANT
SURGIFLO W/THROMBIN 8M KIT (HEMOSTASIS) IMPLANT
SUT ETHILON 2 0 PS N (SUTURE) IMPLANT
SUT MNCRL 3 0 VIOLET RB1 (SUTURE) IMPLANT
SUT MNCRL AB 4-0 PS2 18 (SUTURE) ×4 IMPLANT
SUT NOVA 0 T19/GS 22DT (SUTURE) IMPLANT
SUT PDS AB 0 CT1 36 (SUTURE) ×4 IMPLANT
SUT VIC AB 0 CT1 27XBRD ANTBC (SUTURE) ×2 IMPLANT
SUT VIC AB 2-0 SH 27XBRD (SUTURE) ×2 IMPLANT
SUT VIC AB 3-0 SH 27X BRD (SUTURE) IMPLANT
SUT VIC AB 4-0 RB1 27XBRD (SUTURE) IMPLANT
SUT VLOC 3-0 9IN GRN (SUTURE) IMPLANT
SUTURE VLOC BRB 180 ABS3/0GR12 (SUTURE) ×4 IMPLANT
WATER STERILE IRR 1000ML POUR (IV SOLUTION) ×2 IMPLANT

## 2024-03-28 NOTE — Op Note (Signed)
 Operative Note  Preoperative diagnosis:  1.  Localized prostate cancer  Postoperative diagnosis: 1.  Localized prostate cancer  Procedure(s): 1.  Robotic assisted laparoscopic radical prostatectomy (partial unilateral nerve sparing on the left) 2.  Robotic assisted laparoscopic bilateral pelvic lymph node dissection  Surgeon: Donnice Siad, MD  An assistant was required for this surgical procedure.  The duties of the assistant included but were not limited to suctioning, passing suture, camera manipulation, retraction.  This procedure would not be able to be performed without an Geophysicist/field seismologist.   Resident: Lyle Civil, PGY-4  Anesthesia:  General  Complications:  None  EBL:   Specimens: 1.  Prostate with seminal vesicles 2.  Periprostatic fat 3. Bilateral pelvic lymph nodes  Drains/Catheters: 1.  18 French Foley catheter  Intraoperative findings:   Approximately 30cc prostate.  No accessory pudendal vessels.  Successful unilateral left nerve spare.  Excellent hemostasis.  Water -tight anastomosis without leak.  Indication:  Andrew Holloway. is a 53 y.o. malewho initially presented with an elevated PSA.  Prostate biopsy showed Gleason 4+4=8 prostate cancer involving the right side of the prostate.  Treatment options were discussed with him at length and he chose robotic assisted laparoscopic radical prostatectomy.  He has some mild erectile dysfunction with preoperative SHIM 20 and the plan is for unilateral nerve sparing given presence of high risk disease on the right.  Bilateral pelvic lymph node dissection was planned due to his risk stratification.  Description of procedure: The indications, alternatives, benefits, and risks were discussed with the patient and informed symptoms obtained.  The patient was brought to the operating room table, positioned supine and secured to the bed with a safety strap.  All pressure points were carefully padded and pneumatic compression  devices were placed on lower extremities.  After the administration of intravenous antibiotics and general endotracheal anesthesia, the patient was repositioned in the dorsal lithotomy position using well-padded Allen stirrups.  The arms were carefully tucked at the patient's side and secured with padding.  The chest was secured in place with foam padding and cloth tape and the table was positioned in approximately 30 degree Trendelenburg.  A rectal exam showed the prostate to be 30 cc, without nodulesand not fixed. The patient's abdomen, genitalia, and upper thighs were prepped and draped in the standard sterile manner.  A time was completed, verifying the correct patient, surgical procedure and positioning prior to beginning the procedure.  An 75 French urethral catheter was inserted to drain the bladder.  Pneumoperitoneum was introduced by placing a Veress needle at Palmar's point into the abdomen and insufflated with CO2 to a pressure of 15 mmHg. I then used a visiport 5mm at the planned location of the left medial trocar. The 0 degree camera was then passed under direct visualization.  The abdominal cavity was examined for any sign of injury, adhesions, and identification of anatomic landmarks. He has some adhesions superior to his umbilicus that were taken down sharply with scissors. The remainder of the trochars were placed which included 2 separate 8 millimeter robotic trochars which were placed 9 cm laterally and inferiorly to the initially placed camera trocar which was an 8mm superior to the umbilicus.  A 12 mm trocar was placed 8 mm lateral to the right robotic trocar.  A separate 8 mm robotic trocar was placed 8 cm lateral to the previously placed left robotic trocar on the left side.  A 5 mm trocar was placed to the right and well above  the umbilicus which approximately 10 and 12 cm away from the right-sided trochars.  The robot was then docked.  I placed monopolar scissors in the right hand, a  fenestrated bipolar in the left hand and a prograsp in the fourth arm.  The urachus and median umbilical ligament was divided and we developed the space of Retzius down to the pubic bone.  I divided the parietal peritoneum laterally up to the vas deferens on each side.  Using the prograsp forcep to provide cranial traction on the urachus, the prostate was then defatted above the prostatic vesicle junction, and the superficial dorsal venous complex was coagulated with bipolar and divided.  We submitted the periprostatic fat for pathological specimen.  The endopelvic fascia was sharply opened bilaterally and the levator muscle fibers were swept posterior laterally allowing for visualization of the deep dorsal venous complex and apex of the prostate.  The puboprostatic ligaments were sharply divided and care was taken to preserve the dorsal venous complex.  I secured the dorsal venous complex with a battery operated stapler.  I then addressed the bladder neck. I identified the bladder neck by pulling a Foley catheter.  The fourth arm was applying cranial traction on the urachus.  I divided the anterior bladder neck musculature until I found the anterior bladder neck mucosa which was then incised. I identified the Foley catheter within, the balloon was deflated, we pulled the Foley catheter out into the operating field.  The assistant then used a grasper to apply traction to the Foley catheter and the surgical tech then placed a Powell on the catheter near the penis for optimal retraction.  I then divided the lateral bladder neck mucosa in the posterior bladder neck mucosa.  I was well away from the bilateral ureteral orifices.  I divided the posterior bladder neck musculature until I discovered the longitudinal fibers and kept dissecting until I found the vas deferens.  The bilateral vas deferens were then freed and divided.  I then freed the bilateral seminal vesicles using blunt and sharp dissection.  I placed  metal clips on the seminal vesicle vessels and avoided cautery around the seminal vesicles.  I divided the Denonvilliers fascia beneath the prostate and developed the prostate off the rectum.  This plane was bluntly developed towards the apex of the prostate.  I then addressed the pedicles, first starting with the right and then moving towards the left. The right pedicles were taken widely. I then did a unilateral left sided nerve spare by dividing the lateral pelvic fascia off of the prostatic capsule laterally.  I then isolated the pedicles of the prostate and placed Weck clips on the pedicles and then divided the pedicles with cold scissors.  I continued to divide the neurovascular bundles off of the prostate out to the apex of the prostate.  At this point the prostate was essentially freed up except for the urethra.   I then addressed the prostate anteriorly, dividing the dorsal vein with cautery.  The anterior urethra was then sharply divided with cold scissors.  The Foley catheter was then pulled back and we divided the posterior urethral wall. Patent venous sinuses were then oversewn with a 4-0 Vicryl suture.  The specimen was then placed in a Endo Catch bag and then the bag was placed in the upper abdomen out of the way.  I then irrigated the pelvis.  We performed a rectal test by instilling air into the rectal Foley.  The test was negative.  There is no concern for rectal injury.  I then over sewed a few bleeders alongside the pedicles with a 4-0 Vicryl suture on an RB1 needle.  I then performed a bilateral pelvic lymph node dissection by incising the fascia overlying the right external iliac vein, dissecting distally.  I went just distal to the node of Cloquet were replaced clips and then divided the lymphatics.  The lateral aspect of the dissection was the pelvic sidewall, inferior was the obturator nerve and proximal of the hypogastric vessels.  I placed clips at the proximal aspect and then  divided the lymphatics.  The specimen was removed with the scope grasper and sent to pathology.  Grossly, there were no enlarged lymph nodes.  This was performed on both the right and left pelvic lymph nodes.  With good hemostasis confirmed, I then did the posterior reconstruction with a Rocco stitch.  I used a 3-0 Vloc suture on an RB1 needle.  I passed the sutures through the cut edges of Denonvilliers fascia beneath the bladder on the right side and through the posterior serrated sphincter underneath the urethra.  I ran this from right to left.  And then took the other end of the suture passing just proximal to the posterior bladder neck in the midline into the posterior serrated sphincter and around this from right to left using 3 throws and reapproximated the sutures.  I then completed the urethrovesical anastomosis using a 3-0 Vloc suture on an RB1 needle.  I passed both ends of the suture from the outside in through the bladder neck at the 6 o'clock position.  I passed both through the urethral stump from the inside out and the corresponding position.  I reapproximated the bladder neck to the urethra.  I then ran the left suture on the left side anastomosis to the 9 o'clock position.  And then went back to the right-sided suture around that up the right side of the 12 o'clock position.  I then continued the left suture to the 12 o'clock position. I identified the ureteral orifices and ensured that these were not incorporated with the sutures.  I then placed a new 18 French Foley catheter into the bladder and filled it with 10 cc of sterile water .  I then secured the knot and then passed the suture behind the pubic bone for anterior suspension.  The bladder was irrigated with 200 cc of water .  There was no leak.  Hemostasis was excellent.  A 15 French drain was placed through the previously placed fourth arm.  We did place a Carter-Thomason 0 vicryl suture through the 12 mm assistant port. The robot was  undocked and all the trochars were removed under direct vision.    I enlarged the umbilical trocar site large enough to remove the prostate and closed the fascia with prior mesh with interrupted 0 novafil sutures.  All the port sites were irrigated.  Exparel  was injected to the trocar sites.  The skin was closed with 4-0 Monocryl in a running subcuticular fashion.  Skin glue was applied.  At this point, the patient was extubated and awakened in the operating room and taken to recovery room in stable condition.  There were no immediate complications.  All counts were correct.  Plan: Admit for observation overnight.  Clear liquids tonight. Anticipate discharge home tomorrow.  Matt R. Analis Distler MD [facility]  Pager: [phone]

## 2024-03-28 NOTE — Discharge Summary (Shared)
 Date of admission: 03/28/2024  Date of discharge: 03/28/2024  Admission diagnosis: Prostate Cancer  Discharge diagnosis: Prostate Cancer  History and Physical: For full details, please see admission history and physical. Briefly, Andrew Theil. is a 53 y.o. gentleman with localized prostate cancer.  After discussing management/treatment options, he elected to proceed with surgical treatment.  Hospital Course: Andrew Kaluzny Allport Jr. was taken to the operating room on 03/28/2024 and underwent a robotic assisted laparoscopic radical prostatectomy. He tolerated this procedure well and without complications. Postoperatively, he was able to be transferred to a regular hospital room following recovery from anesthesia.  He was able to begin ambulating the night of surgery. He remained hemodynamically stable overnight.  He had excellent urine output with appropriately minimal output from his pelvic drain and his pelvic drain was removed on POD #1.  He was transitioned to oral pain medication, tolerated a clear liquid diet, and had met all discharge criteria and was able to be discharged home later on POD#1.  Laboratory values: No results for input(s): HGB, HCT in the last 72 hours.  Disposition: Home  Discharge instruction: He was instructed to be ambulatory but to refrain from heavy lifting, strenuous activity, or driving. He was instructed on urethral catheter care.  Discharge medications:   Allergies as of 03/28/2024   No Known Allergies   Med Rec must be completed prior to using this [facility]***       Followup: He will followup in 1 week for catheter removal and to discuss his surgical pathology results.

## 2024-03-28 NOTE — Anesthesia Procedure Notes (Signed)
 Procedure Name: Intubation Date/Time: 03/28/2024 12:31 PM  Performed by: Augusta Daved SAILOR, CRNAPre-anesthesia Checklist: Patient identified, Emergency Drugs available, Suction available and Patient being monitored Patient Re-evaluated:Patient Re-evaluated prior to induction Oxygen Delivery Method: Circle System Utilized Preoxygenation: Pre-oxygenation with 100% oxygen Induction Type: IV induction Ventilation: Mask ventilation without difficulty Laryngoscope Size: 2 Grade View: Grade II Tube type: Oral Tube size: 7.5 mm Number of attempts: 1 Airway Equipment and Method: Stylet and Oral airway Placement Confirmation: ETT inserted through vocal cords under direct vision, positive ETCO2 and breath sounds checked- equal and bilateral Secured at: 22 (at the lip) cm Tube secured with: Tape Dental Injury: Teeth and Oropharynx as per pre-operative assessment

## 2024-03-28 NOTE — Anesthesia Preprocedure Evaluation (Addendum)
 Anesthesia Evaluation  Patient identified by MRN, date of birth, ID band Patient awake    Reviewed: Allergy & Precautions, NPO status , Patient's Chart, lab work & pertinent test results  History of Anesthesia Complications Negative for: history of anesthetic complications  Airway Mallampati: III  TM Distance: >3 FB Neck ROM: Full    Dental  (+) Dental Advisory Given   Pulmonary neg pulmonary ROS   breath sounds clear to auscultation       Cardiovascular hypertension, Pt. on medications  Rhythm:Regular     Neuro/Psych  negative psych ROS   GI/Hepatic negative GI ROS, Neg liver ROS,,,  Endo/Other  diabetes    Renal/GU negative Renal ROS     Musculoskeletal negative musculoskeletal ROS (+)    Abdominal   Peds  Hematology negative hematology ROS (+)   Anesthesia Other Findings   Reproductive/Obstetrics                              Anesthesia Physical Anesthesia Plan  ASA: 2  Anesthesia Plan: General   Post-op Pain Management:    Induction:   PONV Risk Score and Plan: 3 and Ondansetron  and Dexamethasone   Airway Management Planned: Oral ETT  Additional Equipment: None  Intra-op Plan:   Post-operative Plan: Extubation in OR  Informed Consent: I have reviewed the patients History and Physical, chart, labs and discussed the procedure including the risks, benefits and alternatives for the proposed anesthesia with the patient or authorized representative who has indicated his/her understanding and acceptance.     Dental advisory given  Plan Discussed with: CRNA  Anesthesia Plan Comments:          Anesthesia Quick Evaluation

## 2024-03-28 NOTE — Transfer of Care (Signed)
 Immediate Anesthesia Transfer of Care Note  Patient: Andrew Holloway.  Procedure(s) Performed: PROSTATECTOMY, RADICAL, ROBOT-ASSISTED, LAPAROSCOPIC LYMPHADENECTOMY, PELVIS, ROBOT-ASSISTED (Bilateral)  Patient Location: PACU  Anesthesia Type:General  Level of Consciousness: drowsy and patient cooperative  Airway & Oxygen Therapy: Patient Spontanous Breathing and Patient connected to face mask oxygen  Post-op Assessment: Report given to RN and Post -op Vital signs reviewed and stable  Post vital signs: Reviewed and stable  Last Vitals:  Vitals Value Taken Time  BP 128/93 03/28/24 17:45  Temp    Pulse 92 03/28/24 17:46  Resp 20 03/28/24 17:46  SpO2 96 % 03/28/24 17:46  Vitals shown include unfiled device data.  Last Pain:  Vitals:   03/28/24 1049  TempSrc: Oral  PainSc:          Complications: No notable events documented.

## 2024-03-28 NOTE — H&P (Signed)
 Office Visit Report     03/18/2024   --------------------------------------------------------------------------------   Andrew Holloway  MRN: [id number]  DOB: August 03, 1971, 53 year old Male  SSN: [id number]   PRIMARY CARE:  Rollene Northern, NP  PRIMARY CARE FAX:  [phone]  REFERRING:  Rosina Riis, MD  PROVIDER:  Donnice Siad, M.D.  TREATING:  [place], [place]  LOCATION:  [facility] [phone]     --------------------------------------------------------------------------------   CC/HPI: Pt presents today for pre-operative history and physical exam in anticipation of robotic assisted lap radical prostatectomy with bilateral pelvic lymph node dissection by Dr. Siad on 03/28/24. He is doing well and is without complaint.   Pt denies F/C, HA, CP, SOB, N/V, diarrhea/constipation, back pain, flank pain, hematuria, and dysuria.     HX:    Andrew Holloway was referred to discuss his new diagnosis of prostate cancer and definitive treatment options.   1. Localized high risk prostate cancer:  Patient underwent prostate biopsy on 12/05/2023 for an elevated PSA of 5.2 ng/mL. Biopsy revealed GS 4+4 = 8 at the right apex, 3+4 = 7 at the right mid, 3+4 = 7 at the right base, 3+4 = 7 at the right lateral base, adenocarcinoma of the prostate with 4/12 total cores positive (2-50%), TRUS volume of 28 cm3. Denies new or worsening bone or back pain. Good appetite and stable weight.   Family history: Denies  Imaging studies: PSMA PET scan 01/01/2024 with focal radiotracer accumulation in the right prostate and no evidence of metastatic disease.   PMH: Diabetes, history of urolithiasis  PSH: Umbilical hernia repair in 2014 with revision epigastric and umbilical hernia with 6 cm Ventralex mesh in 2016   TNM stage: [id number]  PSA: 5.2  Gleason score: 4+4=8  Biopsy: 12/05/2023  Left: BPH only  Right: revealed GS 4+4 = 8 at the right apex, 3+4 = 7 at the right mid, 3+4 = 7 at the  right base, 3+4 = 7 at the right lateral base  Prostate volume: 28 cc  PSAD: 0.18   Nomogram  CSS (15 year): 93%  PFS (5 year, 10 year): 50%, 41%  EPE: 40%  LNI: 10%  SVI: 7%   IPSS: 2  SHIM: 20   His wife assists with medical credentialing at [facility]. [place].     ALLERGIES: None   MEDICATIONS: metFORMIN  HCl 1000 MG Tablet  Losartan  Potassium 25 MG Tablet  Ozempic  (2 MG/DOSE)  Rosuvastatin  Calcium  10 MG Tablet     GU PSH: None     PSH Notes: right rotator cuff repair  bilateral ankle surgery    NON-GU PSH: Deviated Septum Surgery Hernia Repair Visit Complexity (formerly GPC1X) - 01/16/2024     GU PMH: Stress Incontinence - 03/03/2024 Prostate Cancer - 01/23/2024, - 01/16/2024 ED due to arterial insufficiency - 01/16/2024 History of prostate cancer    NON-GU PMH: Muscle weakness (generalized) - 03/03/2024, - 01/23/2024 Other muscle spasm - 03/03/2024 Diabetes Type 2    FAMILY HISTORY: Breast Cancer - Mother   SOCIAL HISTORY: Marital Status: Married Current Smoking Status: Patient has never smoked.   Tobacco Use Assessment Completed: Used Tobacco in last 30 days? Does not use smokeless tobacco. Drinks 1 drink per week. Light Drinker.  Does not use drugs. Drinks 2 caffeinated drinks per day. Has not had a blood transfusion.     Notes: ETOH on occasion    REVIEW OF SYSTEMS:    GU Review Male:   Patient  denies frequent urination, hard to postpone urination, burning/ pain with urination, get up at night to urinate, leakage of urine, stream starts and stops, trouble starting your stream, have to strain to urinate , erection problems, and penile pain.  Gastrointestinal (Upper):   Patient denies nausea, vomiting, and indigestion/ heartburn.  Gastrointestinal (Lower):   Patient denies diarrhea and constipation.  Constitutional:   Patient denies fever, night sweats, weight loss, and fatigue.  Skin:   Patient denies skin rash/ lesion and itching.  Eyes:    Patient denies blurred vision and double vision.  Ears/ Nose/ Throat:   Patient denies sore throat and sinus problems.  Hematologic/Lymphatic:   Patient denies swollen glands and easy bruising.  Cardiovascular:   Patient denies leg swelling and chest pains.  Respiratory:   Patient denies cough and shortness of breath.  Endocrine:   Patient denies excessive thirst.  Musculoskeletal:   Patient denies back pain and joint pain.  Neurological:   Patient denies headaches and dizziness.  Psychologic:   Patient denies depression and anxiety.   VITAL SIGNS:      03/18/2024 01:05 PM  Weight 195 lb / 88.45 kg  Height 68 in / 172.72 cm  BP 119/83 mmHg  Pulse 89 /min  Temperature 97.7 F / 36.5 C  BMI 29.6 kg/m   MULTI-SYSTEM PHYSICAL EXAMINATION:    Constitutional: Well-nourished. No physical deformities. Normally developed. Good grooming.  Neck: Neck symmetrical, not swollen. Normal tracheal position.  Respiratory: Normal breath sounds. No labored breathing, no use of accessory muscles.   Cardiovascular: Regular rate and rhythm. No murmur, no gallop.   Lymphatic: No enlargement of neck, axillae, groin.  Skin: No paleness, no jaundice, no cyanosis. No lesion, no ulcer, no rash.  Neurologic / Psychiatric: Oriented to time, oriented to place, oriented to person. No depression, no anxiety, no agitation.  Gastrointestinal: No mass, no tenderness, no rigidity, obese abdomen.   Eyes: Normal conjunctivae. Normal eyelids.  Ears, Nose, Mouth, and Throat: Left ear no scars, no lesions, no masses. Right ear no scars, no lesions, no masses. Nose no scars, no lesions, no masses. Normal hearing. Normal lips.  Musculoskeletal: Normal gait and station of head and neck.     Complexity of Data:  Records Review:   Previous Patient Records  Urine Test Review:   Urinalysis   03/18/24  Urinalysis  Urine Appearance Clear   Urine Color Yellow   Urine Glucose Neg mg/dL  Urine Bilirubin Neg mg/dL  Urine Ketones  Neg mg/dL  Urine Specific Gravity 1.025   Urine Blood Neg ery/uL  Urine pH 5.5   Urine Protein Neg mg/dL  Urine Urobilinogen 0.2 mg/dL  Urine Nitrites Neg   Urine Leukocyte Esterase Neg leu/uL   PROCEDURES:          Urinalysis - 81003 Dipstick Dipstick Cont'd  Color: Yellow Bilirubin: Neg mg/dL  Appearance: Clear Ketones: Neg mg/dL  Specific Gravity: 8.974 Blood: Neg ery/uL  pH: 5.5 Protein: Neg mg/dL  Glucose: Neg mg/dL Urobilinogen: 0.2 mg/dL    Nitrites: Neg    Leukocyte Esterase: Neg leu/uL    ASSESSMENT:      ICD-10 Details  1 GU:   Prostate Cancer - C61    PLAN:           Schedule Return Visit/Planned Activity: Keep Scheduled Appointment - Schedule Surgery          Document Letter(s):  Created for Patient: Clinical Summary         Notes:  There are no changes in the patients history or physical exam since last evaluation by Dr. Selma. Pt is scheduled to undergo RALP with BPLND on 03/28/24.   All pt's questions were answered to the best of my ability.          Next Appointment:      Next Appointment: 03/28/2024 12:30 PM    Appointment Type: Surgery     Location: [facility] [phone]    Provider: Resident Resident    Reason for Visit: OP--OBS--RALP W/ BPLND--ASST DR SELMA    Urology Preoperative H&P   Chief Complaint: Prostate cancer  History of Present Illness: Andrew Holloway. is a 53 y.o. male with with high risk prostate cancer here for RALP with b/l PLND. He has had prior umbilical hernia with revision in 2014 and 2016. We discussed risk of recurrence and rare risk of bowel injury. Discussed plan for wider resection on the right and partial nerve sparing on the left. Denies fevers, chills, dysuria.  Past Medical History:  Diagnosis Date   Chicken pox    Diabetes mellitus, type 2 (HCC)    History of kidney stones    Motion sickness    back seat of car   Umbilical hernia 10/17/2011   Wears contact lenses     Past Surgical  History:  Procedure Laterality Date   ANKLE SURGERY Bilateral    x2   COLONOSCOPY WITH PROPOFOL  N/A 05/03/2020   Procedure: COLONOSCOPY WITH PROPOFOL ;  Surgeon: Jinny Carmine, MD;  Location: [facility];  Service: Endoscopy;  Laterality: N/A;  Diabetic - oral meds priority 4   EPIGASTRIC HERNIA REPAIR N/A 03/30/2015   Procedure: HERNIA REPAIR EPIGASTRIC ADULT;  Surgeon: Reyes LELON Cota, MD;  Location: [facility];  Service: General;  Laterality: N/A;   HERNIA REPAIR  2014   umbilical hernia   HERNIA REPAIR  03/30/2015   Epigastric and umbilical hernia with retrorectus 6.4 cm Ventralex mesh   left and right ankle repair ligament     nasal spectrum     rotator cuff right     SHOULDER SURGERY     rotator cuff   WISDOM TOOTH EXTRACTION      Allergies: No Known Allergies  Family History  Problem Relation Age of Onset   Hypertension Mother    Hyperlipidemia Father    Leukemia Father 71       s/p transplant x 2, died from PML   Diabetes Maternal Grandmother    Heart disease Maternal Grandfather    Hyperlipidemia Maternal Grandfather    Heart disease Paternal Grandfather    Hyperlipidemia Paternal Grandfather    Thyroid  cancer Neg Hx     Social History:  reports that he has never smoked. He has never used smokeless tobacco. He reports current alcohol use. He reports that he does not use drugs.  ROS: A complete review of systems was performed.  All systems are negative except for pertinent findings as noted.  Physical Exam:  Vital signs in last 24 hours: Weight:  [89.8 kg] 89.8 kg (08/22 1029) Constitutional:  Alert and oriented, No acute distress Cardiovascular: Regular rate and rhythm Respiratory: Normal respiratory effort, Lungs clear bilaterally GI: Abdomen is soft, nontender, nondistended, no abdominal masses GU: No CVA tenderness Lymphatic: No lymphadenopathy Neurologic: Grossly intact, no focal deficits Psychiatric: Normal mood and affect  Laboratory Data:   No results for input(s): WBC, HGB, HCT, PLT in the last 72 hours.  No results for input(s):  NA, K, CL, GLUCOSE, BUN, CALCIUM , CREATININE in the last 72 hours.  Invalid input(s): CO3   No results found for this or any previous visit (from the past 24 hours). No results found for this or any previous visit (from the past 240 hours).  Renal Function: No results for input(s): CREATININE in the last 168 hours. Estimated Creatinine Clearance: 113.5 mL/min (by C-G formula based on SCr of 0.81 mg/dL).  Radiologic Imaging: No results found.  I independently reviewed the above imaging studies.  Assessment and Plan Andrew Holloway. is a 53 y.o. male with prostate cancer here for RALP with b/l PLND.  We again reviewed r/b of surgery including but not limited to impact on QOL-- most prominently on urinary, bowel, and sexual function. We discussed potential for short and long term urinary incontinence and erectile dysfunction. We discussed potential for surgical complications including infection, bleeding, blood transfusion, injury to urinary and surrounding structures including rectum which could require primary repair or diverting colostomy, vascular injury, neuromuscular injury related to positioning, penile shortening, and bladder neck contracture. We discussed the possibility of positive margins and possible need for adjuvant or salvage therapies in the future. We discussed other perioperative risks including DVT, PE, CVA, MI, pneumonia, and death. He is well informed and all questions were answered.    Matt R. Ahriyah Vannest MD 03/28/2024, 10:51 AM  [facility] Pager: [phone]): [phone]

## 2024-03-29 LAB — BASIC METABOLIC PANEL WITH GFR
Anion gap: 7 (ref 5–15)
BUN: 11 mg/dL (ref 6–20)
CO2: 27 mmol/L (ref 22–32)
Calcium: 8.6 mg/dL — ABNORMAL LOW (ref 8.9–10.3)
Chloride: 103 mmol/L (ref 98–111)
Creatinine, Ser: 0.88 mg/dL (ref 0.61–1.24)
GFR, Estimated: >60 mL/min (ref >60)
Glucose, Bld: 202 mg/dL — ABNORMAL HIGH (ref 70–99)
Potassium: 4.5 mmol/L (ref 3.5–5.1)
Sodium: 137 mmol/L (ref 135–145)

## 2024-03-29 LAB — HEMOGLOBIN AND HEMATOCRIT, BLOOD
HCT: 41.2 % (ref 39.0–52.0)
Hemoglobin: 14 g/dL (ref 13.0–17.0)

## 2024-03-29 LAB — CREATININE, FLUID (PLEURAL, PERITONEAL, JP DRAINAGE): Creat, Fluid: 0.6 mg/dL

## 2024-03-29 LAB — GLUCOSE, CAPILLARY
Glucose-Capillary: 169 mg/dL — ABNORMAL HIGH (ref 70–99)
Glucose-Capillary: 131 mg/dL — ABNORMAL HIGH (ref 70–99)

## 2024-03-29 MED ORDER — SENNOSIDES-DOCUSATE SODIUM 8.6-50 MG PO TABS
2.0000 | ORAL_TABLET | Freq: Every day | ORAL | Status: DC
Start: 2024-03-29 — End: 2024-03-29

## 2024-03-29 MED ORDER — ACETAMINOPHEN 500 MG PO TABS
1000.0000 mg | ORAL_TABLET | Freq: Four times a day (QID) | ORAL | Status: DC
  Administered 2024-03-29 (×2): 1000 mg via ORAL
  Filled 2024-03-29 (×2): qty 2

## 2024-03-29 MED ORDER — CHLORHEXIDINE GLUCONATE CLOTH 2 % EX PADS
6.0000 | MEDICATED_PAD | Freq: Every day | CUTANEOUS | Status: DC
  Administered 2024-03-29: 6 via TOPICAL

## 2024-03-29 MED ORDER — ONDANSETRON HCL 4 MG/2ML IJ SOLN
4.0000 mg | INTRAMUSCULAR | Status: DC | PRN
Start: 2024-03-29 — End: 2024-03-29

## 2024-03-29 NOTE — Progress Notes (Signed)
 1 Day Post-Op Subjective: Pt doing well, not passing gas, ambulated, had some pain overnight. Some burping. No N/V.   Objective: Vital signs in last 24 hours: Temp:  [97.7 F (36.5 C)-99 F (37.2 C)] 98.2 F (36.8 C) (08/23 0629) Pulse Rate:  [76-97] 76 (08/23 0629) Resp:  [11-22] 17 (08/23 0629) BP: (106-140)/(76-95) 106/76 (08/23 0629) SpO2:  [94 %-100 %] 96 % (08/23 0629) Weight:  [89.8 kg] 89.8 kg (08/22 1029)  Intake/Output from previous day: 08/22 0701 - 08/23 0700 In: 2697.7 [P.O.:100; I.V.:2497.7; IV Piggyback:100] Out: 1047 [Urine:775; Drains:172; Blood:100] Intake/Output this shift: No intake/output data recorded.  Physical Exam:  General: Alert and oriented CV: RRR Lungs: Clear Abdomen: Soft, ND, ATTP; lap incision C/D/I  Drain in place serosanguinous output HL:qnozb in place clear yellow urine draining.   Lab Results: Recent Labs    03/28/24 1815 03/29/24 0652  HGB 15.4 14.0  HCT 48.3 41.2   BMET Recent Labs    03/28/24 1815 03/29/24 0652  NA 140 137  K 4.0 4.5  CL 104 103  CO2 22 27  GLUCOSE 191* 202*  BUN 13 11  CREATININE 0.96 0.88  CALCIUM  8.8* 8.6*     Studies/Results: No results found.  Assessment/Plan: 55 M w/ PCa s/p RALP w/ Dr. Selma on 8/22  # RALP post op - advance to Diabetic diet - ambulate TID - continue catheter - Ratio of Drain to UP slightly elevated fluid creatinine ordered - if normal remove drain  - if tolerated diet can DC  # DM2 - continue SSI    LOS: 1 day   Jackey Pea MD 03/29/2024, 8:09 AM [facility]

## 2024-03-29 NOTE — TOC Initial Note (Signed)
 Transition of Care (TOC) - Initial/Assessment Note    Patient Details  Name: Andrew Holloway. MRN: [id number] Date of Birth: Mar 12, 1971  Transition of Care [facility]) CM/SW Contact:    Sonda Manuella Quill, RN Phone Number: 03/29/2024, 3:23 PM  Clinical Narrative:                 Spoke w/ pt and wife Ezekiah Massie [phone]) in room; pt said he lives at home w/ his wife; he plans to return at d/c; she will provide transportation; pt verified insurance/PCP; he denied SDOH risks; pt said he does not have DME, HH services, or home oxygen; no TOC needs.  Expected Discharge Plan: Home/Self Care Barriers to Discharge: No Barriers Identified   Patient Goals and CMS Choice Patient states their goals for this hospitalization and ongoing recovery are:: home          Expected Discharge Plan and Services   Discharge Planning Services: CM Consult   Living arrangements for the past 2 months: Mobile Home Expected Discharge Date: 03/29/24               DME Arranged: N/A DME Agency: NA       HH Arranged: NA HH Agency: NA        Prior Living Arrangements/Services Living arrangements for the past 2 months: Mobile Home Lives with:: Spouse Patient language and need for interpreter reviewed:: Yes Do you feel safe going back to the place where you live?: Yes      Need for Family Participation in Patient Care: Yes (Comment) Care giver support system in place?: Yes (comment) Current home services:  (n/a) Criminal Activity/Legal Involvement Pertinent to Current Situation/Hospitalization: No - Comment as needed  Activities of Daily Living   ADL Screening (condition at time of admission) Independently performs ADLs?: Yes (appropriate for developmental age) Is the patient deaf or have difficulty hearing?: No Does the patient have difficulty seeing, even when wearing glasses/contacts?: No Does the patient have difficulty concentrating, remembering, or making decisions?:  No  Permission Sought/Granted Permission sought to share information with : Case Manager Permission granted to share information with : Yes, Verbal Permission Granted  Share Information with NAME: Case Manager     Permission granted to share info w Relationship: Elward Nocera (spouse) [phone]     Emotional Assessment Appearance:: Appears stated age Attitude/Demeanor/Rapport: Gracious Affect (typically observed): Accepting Orientation: : Oriented to Self, Oriented to Place, Oriented to  Time, Oriented to Situation Alcohol / Substance Use: Not Applicable Psych Involvement: No (comment)  Admission diagnosis:  Malignant neoplasm of prostate (HCC) [C61] Prostate cancer (HCC) [C61] Patient Active Problem List   Diagnosis Date Noted   Prostate cancer (HCC) 12/18/2023   Influenza A 09/03/2023   Rash 01/26/2023   Staphylococcal infection 06/28/2022   Ganglion cyst of dorsum of left wrist 01/24/2021   Encounter for screening colonoscopy    Skin lesion 03/05/2020   Low back pain 10/08/2019   HLD (hyperlipidemia) 08/06/2019   Diabetes mellitus without complication (HCC) 08/06/2019   Abscess of postoperative wound of abdominal wall 05/03/2015   Recurrent umbilical hernia 04/19/2015   Epigastric hernia 03/17/2015   Obesity, unspecified 02/13/2013   Skin lesion of scalp 02/13/2013   Umbilical hernia 10/17/2011   PCP:  Dineen Rollene MATSU, FNP Pharmacy:   [id number] [place], [place] - [address] AT [facility] OF [address] [place] [address] Phone: [phone] Fax: [phone]  Social Drivers of Health (SDOH) Social History: SDOH Screenings   Food Insecurity: No Food Insecurity (03/29/2024)  Housing: Low Risk  (03/29/2024)  Transportation Needs: No Transportation Needs (03/29/2024)  Utilities: Not At Risk (03/29/2024)  Depression (PHQ2-9): Low Risk  (09/17/2023)  Financial Resource Strain: Patient Declined  (11/08/2021)  Physical Activity: Sufficiently Active (09/14/2020)  Social Connections: Unknown (11/08/2021)  Tobacco Use: Low Risk  (03/28/2024)   SDOH Interventions: Food Insecurity Interventions: Intervention Not Indicated, Inpatient TOC Housing Interventions: Intervention Not Indicated, Inpatient TOC Transportation Interventions: Intervention Not Indicated, Inpatient TOC Utilities Interventions: Intervention Not Indicated, Inpatient TOC   Readmission Risk Interventions     No data to display

## 2024-03-29 NOTE — Discharge Summary (Signed)
 Date of admission: 03/28/2024  Date of discharge: 03/29/2024  Admission diagnosis: Prostate Ca   Discharge diagnosis: Same  Secondary diagnoses:  Patient Active Problem List   Diagnosis Date Noted   Prostate cancer (HCC) 12/18/2023   Influenza A 09/03/2023   Rash 01/26/2023   Staphylococcal infection 06/28/2022   Ganglion cyst of dorsum of left wrist 01/24/2021   Encounter for screening colonoscopy    Skin lesion 03/05/2020   Low back pain 10/08/2019   HLD (hyperlipidemia) 08/06/2019   Diabetes mellitus without complication (HCC) 08/06/2019   Abscess of postoperative wound of abdominal wall 05/03/2015   Recurrent umbilical hernia 04/19/2015   Epigastric hernia 03/17/2015   Obesity, unspecified 02/13/2013   Skin lesion of scalp 02/13/2013   Umbilical hernia 10/17/2011    Procedures performed: Procedure(s): PROSTATECTOMY, RADICAL, ROBOT-ASSISTED, LAPAROSCOPIC LYMPHADENECTOMY, PELVIS, ROBOT-ASSISTED  History and Physical: For full details, please see admission history and physical. Briefly, Andrew Nyce. is a 53 y.o. year old patient with Prostate Ca s/p RALP w/ Dr. Selma 8/22.   Hospital Course: Patient tolerated the procedure well.  He was then transferred to the floor after an uneventful PACU stay.  His hospital course was uncomplicated.  On POD#1 he had met discharge criteria: was eating a regular diet, was up and ambulating independently,  pain was well controlled, and was ready to for discharge. His fluid creatinine was serous and his drain was removed.   His post op labs were WNL.    Laboratory values:  Recent Labs    03/28/24 1815 03/29/24 0652  HGB 15.4 14.0  HCT 48.3 41.2   Recent Labs    03/28/24 1815 03/29/24 0652  NA 140 137  K 4.0 4.5  CL 104 103  CO2 22 27  GLUCOSE 191* 202*  BUN 13 11  CREATININE 0.96 0.88  CALCIUM  8.8* 8.6*   No results for input(s): LABPT, INR in the last 72 hours. No results for input(s): LABURIN in the last 72  hours. Results for orders placed or performed during the hospital encounter of 06/23/22  Surgical pcr screen     Status: Abnormal   Collection Time: 06/23/22  5:38 AM   Specimen: Nasal Mucosa; Nasal Swab  Result Value Ref Range Status   MRSA, PCR NEGATIVE NEGATIVE Final   Staphylococcus aureus POSITIVE (A) NEGATIVE Final    Comment: (NOTE) The Xpert SA Assay (FDA approved for NASAL specimens in patients 45 years of age and older), is one component of a comprehensive surveillance program. It is not intended to diagnose infection nor to guide or monitor treatment. Performed at [facility], [address], [place], [place] [address]     Disposition: Home  Discharge instruction: The patient was instructed to be ambulatory but told to refrain from heavy lifting, strenuous activity, or driving.   Discharge medications:    Followup:   Follow-up Information     Selma Donnice SAUNDERS, MD Follow up on 04/03/2024.   Specialty: Urology Why: 7:45AM Contact information: [address] [place] [address] [phone]

## 2024-03-29 NOTE — Discharge Instructions (Addendum)
 Discharge Radical Prostatectomy   Activity:  You are encouraged to ambulate frequently (about every hour during waking hours) to help prevent blood clots from forming in your legs or lungs.  However, you should not engage in any heavy lifting (> 10-15 lbs), strenuous activity, or straining. Diet: You should continue a clear liquid diet until passing gas from below.  Once this occurs, you may advance your diet to a soft diet that would be easy to digest (i.e soups, scrambled eggs, mashed potatoes, etc.) for 24 hours just as you would if getting over a bad stomach flu.  If tolerating this diet well for 24 hours, you may then begin eating regular food.  It will be normal to have some amount of bloating, nausea, and abdominal discomfort intermittently. Prescriptions:  You will be provided a prescription for pain medication to take as needed.  If your pain is not severe enough to require the prescription pain medication, you may take extra strength Tylenol  instead.  You should also take an over the counter stool softener (Colace 100 mg twice daily) to avoid straining with bowel movements as the pain medication may constipate you. Finally, you will also be provided a prescription for an antibiotic to begin the day prior to your return visit in the office for catheter removal. Catheter care: You will be taught how to take care of the catheter by the nursing staff prior to discharge from the hospital.  You may use both a leg bag and the larger bedside bag but it is recommended to at least use the bigger bedside bag at nighttime as the leg bag is small and will fill up overnight and also does not drain as well when lying flat. You may periodically feel a strong urge to void with the catheter in place.  This is a bladder spasm and most often can occur when having a bowel movement or when you are moving around. It is typically self-limited and usually will stop after a few minutes.  You may use some Vaseline or Neosporin  around the tip of the catheter to reduce friction at the tip of the penis. Incisions: You may remove your dressing bandages the 2nd day after surgery.  You most likely will have a few small staples in each of the incisions and once the bandages are removed, the incisions may stay open to air.  You may start showering (not soaking or bathing in water ) 48 hours after surgery and the incisions simply need to be patted dry after the shower.  No additional care is needed. What to call us  about: You should call the office [phone]) if you develop fever > 101, persistent vomiting, or the catheter stops draining. Also, feel free to call with any other questions you may have and remember the handout that was provided to you as a reference preoperatively which answers many of the common questions that arise after surgery.

## 2024-03-30 NOTE — Anesthesia Postprocedure Evaluation (Signed)
 Anesthesia Post Note  Patient: Andrew Holloway.  Procedure(s) Performed: PROSTATECTOMY, RADICAL, ROBOT-ASSISTED, LAPAROSCOPIC LYMPHADENECTOMY, PELVIS, ROBOT-ASSISTED (Bilateral)     Patient location during evaluation: PACU Anesthesia Type: General Level of consciousness: awake and alert Pain management: pain level controlled Vital Signs Assessment: post-procedure vital signs reviewed and stable Respiratory status: spontaneous breathing, nonlabored ventilation and respiratory function stable Cardiovascular status: blood pressure returned to baseline and stable Postop Assessment: no apparent nausea or vomiting Anesthetic complications: no   No notable events documented.                Ricarda Atayde

## 2024-03-31 ENCOUNTER — Telehealth: Payer: Self-pay

## 2024-03-31 ENCOUNTER — Encounter (HOSPITAL_COMMUNITY): Payer: Self-pay | Admitting: Urology

## 2024-03-31 NOTE — Transitions of Care (Post Inpatient/ED Visit) (Signed)
   03/31/2024  Name: Andrew Charrette Hartel Jr. MRN: [id number] DOB: 1970/08/31  Today's TOC FU Call Status: Today's TOC FU Call Status:: Unsuccessful Call (1st Attempt) Unsuccessful Call (1st Attempt) Date: 03/31/24  Attempted to reach the patient regarding the most recent Inpatient/ED visit.  Follow Up Plan: Additional outreach attempts will be made to reach the patient to complete the Transitions of Care (Post Inpatient/ED visit) call.   Joeanne Robicheaux J. Jackelyne Sayer RN, MSN [facility], [facility] RN Care Manager Direct Dial: [phone]  Fax: [phone] Website: [email or web address]

## 2024-04-01 NOTE — Telephone Encounter (Signed)
 This encounter was created in error - please disregard.

## 2024-04-02 ENCOUNTER — Telehealth: Payer: Self-pay

## 2024-04-02 NOTE — Transitions of Care (Post Inpatient/ED Visit) (Signed)
   04/02/2024  Name: Andrew Longton Meriweather Jr. MRN: [id number] DOB: 28-Oct-1970  Today's TOC FU Call Status: Today's TOC FU Call Status:: Unsuccessful Call (2nd Attempt) Unsuccessful Call (2nd Attempt) Date: 04/02/24  Attempted to reach the patient regarding the most recent Inpatient/ED visit.  Follow Up Plan: Additional outreach attempts will be made to reach the patient to complete the Transitions of Care (Post Inpatient/ED visit) call.   Arvin Seip RN, BSN, CCM [employer], Population Health Case Manager Phone: [phone]

## 2024-04-02 NOTE — Transitions of Care (Post Inpatient/ED Visit) (Signed)
 04/02/2024  Name: Andrew Guttman Hornik Jr. MRN: [id number] DOB: [birth year]  Today's TOC FU Call Status: Today's TOC FU Call Status:: Successful TOC FU Call Completed TOC FU Call Complete Date: 04/02/24 Patient's Name and Date of Birth confirmed.  Transition Care Management Follow-up Telephone Call How have you been since you were released from the hospital?: Better Any questions or concerns?: Yes Patient Questions/Concerns:: Patient reports having some blood in his catheter.  He states this is infrequent and is only light blood tinged. Patient reports pain / discomfort level is a 2. Patient states he is scheduled to see the urologist on 04/03/24. Patient Questions/Concerns Addressed: Other: (Patient's concerns discussed.)  Items Reviewed: Did you receive and understand the discharge instructions provided?: Yes Medications obtained,verified, and reconciled?: Yes (Medications Reviewed) Any new allergies since your discharge?: No Dietary orders reviewed?: Yes Type of Diet Ordered:: Carbohydrate modified diet. Do you have support at home?: Yes People in Home [RPT]: spouse Name of Support/Comfort Primary Source: christy Cowper  Medications Reviewed Today: Medications Reviewed Today     Reviewed by Sigmond Patalano E, RN (Registered Nurse) on 04/02/24 at 1311  Med List Status: <None>   Medication Order Taking? Sig Documenting Provider Last Dose Status Informant  APPLE CIDER VINEGAR PO [id number] Yes Take 15 mLs by mouth 3 (three) times a week. [provider]  Active Self  Aspirin-Acetaminophen -Caffeine (GOODYS EXTRA STRENGTH) 500-325-65 MG PACK [id number] Yes Take 1 packet by mouth daily as needed (pain.). [provider]  Active Self  Berberine Chloride (BERBERINE HCI PO) [id number] Yes Take 1 tablet by mouth daily before lunch. [provider]  Active Self  blood glucose meter kit and supplies [id number] Yes Dispense based on patient and insurance preference. Use up to  four times daily as directed. (FOR ICD-10 E10.9, E11.9). Dineen Rollene MATSU, FNP  Active Self  docusate sodium  (COLACE) 100 MG capsule [id number] Yes Take 1 capsule (100 mg total) by mouth daily as needed for up to 30 doses. Selma Donnice SAUNDERS, MD  Active   glucose blood (ACCU-CHEK GUIDE) test strip [id number] Yes Use to check blood sugar twice daily Dineen Rollene MATSU, FNP  Active Self  glucose blood test strip [id number] Yes Use as instructed Dineen Rollene MATSU, FNP  Active Self  ibuprofen (ADVIL) 200 MG tablet [id number] Yes Take 200 mg by mouth daily as needed (pain.). [provider]  Active Self  losartan  (COZAAR ) 25 MG tablet [id number] Yes TAKE 1 TABLET(25 MG) BY MOUTH DAILY Dineen Rollene MATSU, FNP  Active Self  metFORMIN  (GLUCOPHAGE ) 1000 MG tablet [id number] Yes Take 1 tablet (1,000 mg total) by mouth daily with breakfast. Dineen Rollene MATSU, FNP  Active Self  NON FORMULARY [id number] Yes Take 1 capsule by mouth daily. Lion's Mane supplement [provider]  Active Self  Omega-3 Fatty Acids (FISH OIL PO) [id number] Yes Take 1 capsule by mouth in the morning. [provider]  Active Self  OVER THE COUNTER MEDICATION [id number] Yes Take 1 tablet by mouth daily. Shilajit Extract [provider]  Active Self  oxyCODONE -acetaminophen  (PERCOCET) 5-325 MG tablet [id number] Yes Take 1 tablet by mouth every 4 (four) hours as needed for up to 25 doses for severe pain (pain score 7-10). Selma Donnice SAUNDERS, MD  Active   rosuvastatin  (CRESTOR ) 10 MG tablet [id number] Yes Take 1 tablet (10 mg total) by mouth daily. Dineen Rollene MATSU, FNP  Active Self  Semaglutide , 2 MG/DOSE, 8 MG/3ML SOPN [id number] Yes Inject 2 mg as directed once  a week. Dineen Rollene MATSU, FNP  Active Self  zinc gluconate 50 MG tablet [id number]  Take 50 mg by mouth daily.  Patient not taking: Reported on 04/02/2024   [provider]  Active Self            Home Care and Equipment/Supplies: Were Home  Health Services Ordered?: No Any new equipment or medical supplies ordered?: No  Functional Questionnaire: Do you need assistance with bathing/showering or dressing?: No Do you need assistance with meal preparation?: No Do you need assistance with eating?: No Do you have difficulty maintaining continence: No Do you need assistance with getting out of bed/getting out of a chair/moving?: No Do you have difficulty managing or taking your medications?: No  Follow up appointments reviewed: PCP Follow-up appointment confirmed?: Yes Date of PCP follow-up appointment?: 04/22/24 Follow-up Provider: Rollene Dineen Driscilla Lionel Follow-up appointment confirmed?: Yes Date of Specialist follow-up appointment?: 04/03/24 Follow-Up Specialty Provider:: Dr. Selma Do you need transportation to your follow-up appointment?: No Do you understand care options if your condition(s) worsen?: Yes-patient verbalized understanding  SDOH Interventions Today    Flowsheet Row Most Recent Value  SDOH Interventions   Food Insecurity Interventions Intervention Not Indicated  Housing Interventions Intervention Not Indicated  Transportation Interventions Intervention Not Indicated  Utilities Interventions Intervention Not Indicated   Discussed and offered 30 day TOC program.  Patient declined.  The patient has been provided with contact information for the care management team and has been advised to call with any health -related questions or concerns.  The patient verbalized understanding with current plan of care.  The patient is directed to their insurance card regarding availability of benefits coverage.    Arvin Seip RN, BSN, CCM [employer], Population Health Case Manager Phone: [phone]

## 2024-04-02 NOTE — Patient Instructions (Signed)
 Visit Information  Thank you for taking time to visit with me today. Please don't hesitate to contact me if I can be of assistance to you.  Patient instructions per discharge summary: Activity: You are encouraged to ambulate frequently (about every hour during waking hours) to help prevent blood clots from forming in your legs or lungs. However, you should not engage in any heavy lifting (> 10-15 lbs), strenuous activity, or straining. Diet: You should continue a clear liquid diet until passing gas from below. Once this occurs, you may advance your diet to a soft diet that would be easy to digest (i.e soups, scrambled eggs, mashed potatoes, etc.) for 24 hours just as you would if getting over a bad stomach flu. If tolerating this diet well for 24 hours, you may then begin eating regular food. It will be normal to have some amount of bloating, nausea, and abdominal discomfort intermittently. Prescriptions: Take medications as prescribed and follow prescription instructions as stated on your discharge summary  Catheter care:  Follow catheter care instructions as stated on your discharge summary  Incision:  Monitor incision site for signs of infection and report those sign/symptoms to the urologist immediately otherwise follow incision care instructions as stated on the discharge summary  What to call us  about: You should call the office [phone]) if you develop fever > 101, persistent vomiting, or the catheter stops draining. Also, feel free to call with any other questions you may have and remember the handout that was provided to you as a reference preoperatively which answers many of the common questions that arise after surgery.   Patient verbalizes understanding of instructions and care plan provided today and agrees to view in MyChart. Active MyChart status and patient understanding of how to access instructions and care plan via MyChart confirmed with patient.     The patient has  been provided with contact information for the care management team and has been advised to call with any health related questions or concerns.   Please call the care guide team at [phone] if you need to cancel or reschedule your appointment.   Please call the Suicide and Crisis Lifeline: 988 call the [place]  National Suicide Prevention Lifeline: 9798431053 or TTY: 9890219555 TTY (959) 118-7320) to talk to a trained counselor call 1-800-273-TALK (toll free, 24 hour hotline) go to [facility] [address] [place] [phone]) if you are experiencing a Mental Health or Behavioral Health Crisis or need someone to talk to.  Arvin Seip RN, BSN, CCM [employer], Population Health Case Manager Phone: [phone]

## 2024-04-03 LAB — SURGICAL PATHOLOGY

## 2024-04-22 ENCOUNTER — Encounter: Payer: Self-pay | Admitting: Family

## 2024-04-22 ENCOUNTER — Ambulatory Visit: Admitting: Family

## 2024-04-22 VITALS — BP 132/70 | HR 94 | Temp 98.6°F | Ht 68.0 in | Wt 198.2 lb

## 2024-04-22 DIAGNOSIS — Z7984 Long term (current) use of oral hypoglycemic drugs: Secondary | ICD-10-CM

## 2024-04-22 DIAGNOSIS — C61 Malignant neoplasm of prostate: Secondary | ICD-10-CM

## 2024-04-22 DIAGNOSIS — E119 Type 2 diabetes mellitus without complications: Secondary | ICD-10-CM

## 2024-04-22 NOTE — Assessment & Plan Note (Signed)
 Chronic, stable.  We opted to hold on resuming ozempic  2mg  Continue metformin  1000mg  BID.

## 2024-04-22 NOTE — Patient Instructions (Signed)
 Nice to see you today!

## 2024-04-22 NOTE — Progress Notes (Signed)
 Assessment & Plan:  Diabetes mellitus without complication (HCC) Assessment & Plan: Chronic, stable.  We opted to hold on resuming ozempic  2mg  Continue metformin  1000mg  BID.      Prostate cancer [facility]) Assessment & Plan: Status post prostatectomy, 03/28/2024, Dr Selma.  Reassuring abdominal exam.  Abdomen is soft, nontender.  Advised to remain vigilant postoperatively in regards to pain and we jointly soreness is expected.  Reassured as it is improved.   He will continue to follow closely with Dr. Selma for surveillance.  Will follow      Return precautions given.   Risks, benefits, and alternatives of the medications and treatment plan prescribed today were discussed, and patient expressed understanding.   Education regarding symptom management and diagnosis given to patient on AVS either electronically or printed.  Return in about 3 months (around 07/22/2024).  Rollene Northern, FNP  Subjective:    Patient ID: Andrew GORMAN Clovis Mickey., male    DOB: 08-12-1970, 53 y.o.   MRN: [id number]  CC: Andrew Simpson Mcmann Mickey. is a 53 y.o. male who presents today for follow up.   HPI: Feels well today No new complaints  He has not taken ozempic  2mg  consistently due to recent surgery, holding.   Compliant with metformin  1000mg  BID.   FBG 110-115  He is walking  3 miles daily  He feels well from a surgical perspective. Non exquisite, episodic soreness right lower abdomen which he shared with Dr Selma. This symptom is improving.    Wearing a liner due to urinary incontinence. Denies constipation, dysuria, hematuria, fever, chills.       Status post prostatectomy, 03/28/2024, Dr Selma F/u 04/03/24 post surgical visit and catheter removed  Presurgical A1c 6.9 03/17/2024. Crt 0.96 Hemoglobin 15.5 Allergies: Patient has no known allergies. Current Outpatient Medications on File Prior to Visit  Medication Sig Dispense Refill   APPLE CIDER VINEGAR PO Take 15 mLs by mouth 3 (three) times a week.      Aspirin-Acetaminophen -Caffeine (GOODYS EXTRA STRENGTH) 500-325-65 MG PACK Take 1 packet by mouth daily as needed (pain.).     Berberine Chloride (BERBERINE HCI PO) Take 1 tablet by mouth daily before lunch.     blood glucose meter kit and supplies Dispense based on patient and insurance preference. Use up to four times daily as directed. (FOR ICD-10 E10.9, E11.9). 1 each 0   glucose blood (ACCU-CHEK GUIDE) test strip Use to check blood sugar twice daily 200 each 3   glucose blood test strip Use as instructed 100 each 12   ibuprofen (ADVIL) 200 MG tablet Take 200 mg by mouth daily as needed (pain.).     losartan  (COZAAR ) 25 MG tablet TAKE 1 TABLET(25 MG) BY MOUTH DAILY 90 tablet 3   metFORMIN  (GLUCOPHAGE ) 1000 MG tablet Take 1 tablet (1,000 mg total) by mouth daily with breakfast. 180 tablet 3   NON FORMULARY Take 1 capsule by mouth daily. Lion's Mane supplement     Omega-3 Fatty Acids (FISH OIL PO) Take 1 capsule by mouth in the morning.     OVER THE COUNTER MEDICATION Take 1 tablet by mouth daily. Shilajit Extract     rosuvastatin  (CRESTOR ) 10 MG tablet Take 1 tablet (10 mg total) by mouth daily. 90 tablet 3   zinc gluconate 50 MG tablet Take 50 mg by mouth daily.     No current facility-administered medications on file prior to visit.    Review of Systems  Constitutional:  Negative for chills and fever.  Respiratory:  Negative for cough.   Cardiovascular:  Negative for chest pain and palpitations.  Gastrointestinal:  Negative for abdominal distention, abdominal pain, constipation, diarrhea, nausea and vomiting.  Genitourinary:  Negative for difficulty urinating and urgency.      Objective:    BP 132/70   Pulse 94   Temp 98.6 F (37 C) (Oral)   Ht 5' 8 (1.727 m)   Wt 198 lb 3.2 oz (89.9 kg)   SpO2 97%   BMI 30.14 kg/m  BP Readings from Last 3 Encounters:  04/22/24 132/70  03/29/24 113/80  03/17/24 (!) 134/91   Wt Readings from Last 3 Encounters:  04/22/24 198 lb 3.2  oz (89.9 kg)  03/28/24 198 lb (89.8 kg)  03/17/24 198 lb (89.8 kg)    Physical Exam Vitals reviewed.  Constitutional:      Appearance: Normal appearance. He is well-developed.  Cardiovascular:     Rate and Rhythm: Regular rhythm.     Heart sounds: Normal heart sounds.  Pulmonary:     Effort: Pulmonary effort is normal. No respiratory distress.     Breath sounds: Normal breath sounds. No wheezing or rales.  Abdominal:     General: Bowel sounds are normal. There is no distension.     Palpations: Abdomen is soft. Abdomen is not rigid. There is no fluid wave or mass.     Tenderness: There is no abdominal tenderness. There is no guarding. Negative signs include Murphy's sign and McBurney's sign.   Skin:    General: Skin is warm and dry.  Neurological:     Mental Status: He is alert.  Psychiatric:        Speech: Speech normal.        Behavior: Behavior normal.

## 2024-04-22 NOTE — Assessment & Plan Note (Signed)
 Status post prostatectomy, 03/28/2024, Dr Selma.  Reassuring abdominal exam.  Abdomen is soft, nontender.  Advised to remain vigilant postoperatively in regards to pain and we jointly soreness is expected.  Reassured as it is improved.   He will continue to follow closely with Dr. Selma for surveillance.  Will follow

## 2024-07-22 ENCOUNTER — Ambulatory Visit: Admitting: Family

## 2024-07-25 ENCOUNTER — Ambulatory Visit: Admitting: Family

## 2024-08-20 ENCOUNTER — Telehealth: Payer: Self-pay

## 2024-08-20 NOTE — Telephone Encounter (Signed)
 LVM to call back to office to inform pt of message below per Rollene

## 2024-08-20 NOTE — Telephone Encounter (Signed)
 Copied from CRM [id number]. Topic: Clinical - Medical Advice >> Aug 20, 2024  8:58 AM Eva FALCON wrote: Reason for CRM: Pt was scheduled for 1/20 but had to cancel due to job loss. States his company shut down and he has no insurance. I did offer the self pay price but just has so much going on. He did mention his blood sugar has been high, no current symptoms, but was hoping to get a call back from Rollene Northern or her nurse to see what they could do in the meantime until he starts his new job and his insurance kicks in. Call back number [phone].

## 2024-08-22 NOTE — Telephone Encounter (Signed)
 LVM to call back to office to discuss message below per Rollene

## 2024-08-22 NOTE — Telephone Encounter (Unsigned)
 Copied from CRM [id number]. Topic: Clinical - Medical Advice >> Aug 20, 2024  8:58 AM Eva FALCON wrote: Reason for CRM: Pt was scheduled for 1/20 but had to cancel due to job loss. States his company shut down and he has no insurance. I did offer the self pay price but just has so much going on. He did mention his blood sugar has been high, no current symptoms, but was hoping to get a call back from Rollene Northern or her nurse to see what they could do in the meantime until he starts his new job and his insurance kicks in. Call back number [phone]. >> Aug 22, 2024 11:48 AM Drema MATSU wrote: Pt is returning a call from clinic. Pt stated that sugars has not been greater than 250 or less than 70.  He said that every once in awhile it will  reach 250 other times 165-230. Please call pt phone not his wife [phone]

## 2024-08-26 ENCOUNTER — Ambulatory Visit: Admitting: Family
